# Patient Record
Sex: Female | Born: 1965 | Race: White | Hispanic: No | Marital: Married | Smoking: Never smoker
Health system: Southern US, Community
[De-identification: ages and names within clinical notes are randomized; demographics above are authoritative.]

## PROBLEM LIST (undated history)

## (undated) DIAGNOSIS — R51 Headache: Secondary | ICD-10-CM

## (undated) DIAGNOSIS — K2950 Unspecified chronic gastritis without bleeding: Secondary | ICD-10-CM

## (undated) DIAGNOSIS — T8859XA Other complications of anesthesia, initial encounter: Secondary | ICD-10-CM

## (undated) DIAGNOSIS — F1010 Alcohol abuse, uncomplicated: Secondary | ICD-10-CM

## (undated) DIAGNOSIS — D649 Anemia, unspecified: Secondary | ICD-10-CM

## (undated) DIAGNOSIS — R188 Other ascites: Secondary | ICD-10-CM

## (undated) DIAGNOSIS — D509 Iron deficiency anemia, unspecified: Secondary | ICD-10-CM

## (undated) DIAGNOSIS — K7460 Unspecified cirrhosis of liver: Secondary | ICD-10-CM

## (undated) DIAGNOSIS — T4145XA Adverse effect of unspecified anesthetic, initial encounter: Secondary | ICD-10-CM

## (undated) DIAGNOSIS — R161 Splenomegaly, not elsewhere classified: Secondary | ICD-10-CM

## (undated) DIAGNOSIS — K922 Gastrointestinal hemorrhage, unspecified: Secondary | ICD-10-CM

## (undated) DIAGNOSIS — I8500 Esophageal varices without bleeding: Secondary | ICD-10-CM

## (undated) DIAGNOSIS — K760 Fatty (change of) liver, not elsewhere classified: Secondary | ICD-10-CM

## (undated) DIAGNOSIS — F419 Anxiety disorder, unspecified: Secondary | ICD-10-CM

## (undated) HISTORY — DX: Unspecified chronic gastritis without bleeding: K29.50

## (undated) HISTORY — DX: Iron deficiency anemia, unspecified: D50.9

## (undated) HISTORY — DX: Unspecified cirrhosis of liver: K74.60

## (undated) HISTORY — DX: Splenomegaly, not elsewhere classified: R16.1

## (undated) HISTORY — DX: Esophageal varices without bleeding: I85.00

## (undated) HISTORY — PX: ORIF TIBIA & FIBULA FRACTURES: SHX2131

---

## 2015-06-24 DIAGNOSIS — F1010 Alcohol abuse, uncomplicated: Secondary | ICD-10-CM

## 2015-06-24 DIAGNOSIS — F419 Anxiety disorder, unspecified: Secondary | ICD-10-CM

## 2015-06-24 DIAGNOSIS — D649 Anemia, unspecified: Secondary | ICD-10-CM

## 2015-06-24 DIAGNOSIS — G8929 Other chronic pain: Secondary | ICD-10-CM

## 2015-06-24 DIAGNOSIS — K760 Fatty (change of) liver, not elsewhere classified: Secondary | ICD-10-CM

## 2015-06-24 DIAGNOSIS — R519 Headache, unspecified: Secondary | ICD-10-CM

## 2015-06-24 HISTORY — DX: Fatty (change of) liver, not elsewhere classified: K76.0

## 2015-06-24 HISTORY — DX: Other chronic pain: G89.29

## 2015-06-24 HISTORY — DX: Headache, unspecified: R51.9

## 2015-06-24 HISTORY — DX: Anemia, unspecified: D64.9

## 2015-06-24 HISTORY — DX: Alcohol abuse, uncomplicated: F10.10

## 2015-06-24 HISTORY — DX: Anxiety disorder, unspecified: F41.9

## 2015-11-04 ENCOUNTER — Inpatient Hospital Stay (HOSPITAL_COMMUNITY)
Admission: AD | Admit: 2015-11-04 | Discharge: 2015-11-07 | DRG: 369 | Disposition: A | Discharge status: Home / Self Care | Payer: Self-pay | Source: Other Acute Inpatient Hospital | Attending: Internal Medicine | Admitting: Internal Medicine

## 2015-11-04 ENCOUNTER — Encounter (HOSPITAL_COMMUNITY): Payer: Self-pay | Admitting: Family Medicine

## 2015-11-04 ENCOUNTER — Encounter (HOSPITAL_COMMUNITY)
Admission: AD | Disposition: A | Discharge status: Home / Self Care | Payer: Self-pay | Source: Other Acute Inpatient Hospital | Attending: Internal Medicine

## 2015-11-04 DIAGNOSIS — Z8711 Personal history of peptic ulcer disease: Secondary | ICD-10-CM

## 2015-11-04 DIAGNOSIS — F1010 Alcohol abuse, uncomplicated: Secondary | ICD-10-CM | POA: Diagnosis present

## 2015-11-04 DIAGNOSIS — K7030 Alcoholic cirrhosis of liver without ascites: Secondary | ICD-10-CM

## 2015-11-04 DIAGNOSIS — I8500 Esophageal varices without bleeding: Secondary | ICD-10-CM | POA: Diagnosis present

## 2015-11-04 DIAGNOSIS — D689 Coagulation defect, unspecified: Secondary | ICD-10-CM | POA: Diagnosis present

## 2015-11-04 DIAGNOSIS — F10239 Alcohol dependence with withdrawal, unspecified: Secondary | ICD-10-CM | POA: Diagnosis present

## 2015-11-04 DIAGNOSIS — Z79899 Other long term (current) drug therapy: Secondary | ICD-10-CM

## 2015-11-04 DIAGNOSIS — IMO0002 Reserved for concepts with insufficient information to code with codable children: Secondary | ICD-10-CM | POA: Diagnosis present

## 2015-11-04 DIAGNOSIS — D500 Iron deficiency anemia secondary to blood loss (chronic): Secondary | ICD-10-CM | POA: Diagnosis present

## 2015-11-04 DIAGNOSIS — I8501 Esophageal varices with bleeding: Principal | ICD-10-CM | POA: Diagnosis present

## 2015-11-04 DIAGNOSIS — K7031 Alcoholic cirrhosis of liver with ascites: Secondary | ICD-10-CM | POA: Diagnosis present

## 2015-11-04 DIAGNOSIS — K766 Portal hypertension: Secondary | ICD-10-CM | POA: Diagnosis present

## 2015-11-04 DIAGNOSIS — K922 Gastrointestinal hemorrhage, unspecified: Secondary | ICD-10-CM | POA: Diagnosis present

## 2015-11-04 DIAGNOSIS — K254 Chronic or unspecified gastric ulcer with hemorrhage: Secondary | ICD-10-CM | POA: Diagnosis present

## 2015-11-04 DIAGNOSIS — R188 Other ascites: Secondary | ICD-10-CM

## 2015-11-04 DIAGNOSIS — F419 Anxiety disorder, unspecified: Secondary | ICD-10-CM | POA: Diagnosis present

## 2015-11-04 DIAGNOSIS — D696 Thrombocytopenia, unspecified: Secondary | ICD-10-CM | POA: Diagnosis present

## 2015-11-04 DIAGNOSIS — F1090 Alcohol use, unspecified, uncomplicated: Secondary | ICD-10-CM | POA: Diagnosis present

## 2015-11-04 HISTORY — DX: Headache: R51

## 2015-11-04 HISTORY — PX: ESOPHAGOGASTRODUODENOSCOPY: SHX5428

## 2015-11-04 HISTORY — DX: Anemia, unspecified: D64.9

## 2015-11-04 HISTORY — DX: Gastrointestinal hemorrhage, unspecified: K92.2

## 2015-11-04 HISTORY — DX: Fatty (change of) liver, not elsewhere classified: K76.0

## 2015-11-04 HISTORY — DX: Alcohol abuse, uncomplicated: F10.10

## 2015-11-04 HISTORY — DX: Anxiety disorder, unspecified: F41.9

## 2015-11-04 LAB — CBC
HCT: 33.7 % — ABNORMAL LOW (ref 36.0–46.0)
HCT: 30.7 % — ABNORMAL LOW (ref 36.0–46.0)
HCT: 30.3 % — ABNORMAL LOW (ref 36.0–46.0)
Hemoglobin: 10.5 g/dL — ABNORMAL LOW (ref 12.0–15.0)
Hemoglobin: 9.7 g/dL — ABNORMAL LOW (ref 12.0–15.0)
Hemoglobin: 9.9 g/dL — ABNORMAL LOW (ref 12.0–15.0)
MCH: 31.2 pg (ref 26.0–34.0)
MCH: 31.4 pg (ref 26.0–34.0)
MCH: 32 pg (ref 26.0–34.0)
MCHC: 31.2 g/dL (ref 30.0–36.0)
MCHC: 31.6 g/dL (ref 30.0–36.0)
MCHC: 32.7 g/dL (ref 30.0–36.0)
MCV: 100 fL (ref 78.0–100.0)
MCV: 99.4 fL (ref 78.0–100.0)
MCV: 98.1 fL (ref 78.0–100.0)
Platelets: 164 10*3/uL (ref 150–400)
Platelets: 135 10*3/uL — ABNORMAL LOW (ref 150–400)
Platelets: 122 10*3/uL — ABNORMAL LOW (ref 150–400)
RBC: 3.37 MIL/uL — ABNORMAL LOW (ref 3.87–5.11)
RBC: 3.09 MIL/uL — ABNORMAL LOW (ref 3.87–5.11)
RBC: 3.09 MIL/uL — ABNORMAL LOW (ref 3.87–5.11)
RDW: 20.1 % — ABNORMAL HIGH (ref 11.5–15.5)
RDW: 20.2 % — ABNORMAL HIGH (ref 11.5–15.5)
RDW: 20.3 % — ABNORMAL HIGH (ref 11.5–15.5)
WBC: 8.5 10*3/uL (ref 4.0–10.5)
WBC: 7.5 10*3/uL (ref 4.0–10.5)
WBC: 6 10*3/uL (ref 4.0–10.5)

## 2015-11-04 LAB — TROPONIN I: Troponin I: <0.03 ng/mL (ref <0.031)

## 2015-11-04 LAB — COMPREHENSIVE METABOLIC PANEL
ALT: 26 U/L (ref 14–54)
ALT: 22 U/L (ref 14–54)
AST: 73 U/L — ABNORMAL HIGH (ref 15–41)
AST: 63 U/L — ABNORMAL HIGH (ref 15–41)
Albumin: 2.9 g/dL — ABNORMAL LOW (ref 3.5–5.0)
Albumin: 2.8 g/dL — ABNORMAL LOW (ref 3.5–5.0)
Alkaline Phosphatase: 87 U/L (ref 38–126)
Alkaline Phosphatase: 77 U/L (ref 38–126)
Anion gap: 15 (ref 5–15)
Anion gap: 9 (ref 5–15)
BUN: 12 mg/dL (ref 6–20)
BUN: 10 mg/dL (ref 6–20)
CO2: 22 mmol/L (ref 22–32)
CO2: 23 mmol/L (ref 22–32)
Calcium: 8.2 mg/dL — ABNORMAL LOW (ref 8.9–10.3)
Calcium: 8.1 mg/dL — ABNORMAL LOW (ref 8.9–10.3)
Chloride: 107 mmol/L (ref 101–111)
Chloride: 105 mmol/L (ref 101–111)
Creatinine, Ser: 0.49 mg/dL (ref 0.44–1.00)
Creatinine, Ser: 0.65 mg/dL (ref 0.44–1.00)
GFR calc Af Amer: >60 mL/min (ref >60)
GFR calc Af Amer: >60 mL/min (ref >60)
GFR calc non Af Amer: >60 mL/min (ref >60)
GFR calc non Af Amer: >60 mL/min (ref >60)
Glucose, Bld: 119 mg/dL — ABNORMAL HIGH (ref 65–99)
Glucose, Bld: 100 mg/dL — ABNORMAL HIGH (ref 65–99)
Potassium: 4.2 mmol/L (ref 3.5–5.1)
Potassium: 3.5 mmol/L (ref 3.5–5.1)
Sodium: 144 mmol/L (ref 135–145)
Sodium: 137 mmol/L (ref 135–145)
Total Bilirubin: 1.7 mg/dL — ABNORMAL HIGH (ref 0.3–1.2)
Total Bilirubin: 2.1 mg/dL — ABNORMAL HIGH (ref 0.3–1.2)
Total Protein: 6.3 g/dL — ABNORMAL LOW (ref 6.5–8.1)
Total Protein: 5.8 g/dL — ABNORMAL LOW (ref 6.5–8.1)

## 2015-11-04 LAB — HEMOGLOBIN AND HEMATOCRIT, BLOOD
HCT: 33.6 % — ABNORMAL LOW (ref 36.0–46.0)
Hemoglobin: 10.6 g/dL — ABNORMAL LOW (ref 12.0–15.0)

## 2015-11-04 LAB — ETHANOL: Alcohol, Ethyl (B): <5 mg/dL (ref <5)

## 2015-11-04 LAB — GLUCOSE, CAPILLARY
Glucose-Capillary: 103 mg/dL — ABNORMAL HIGH (ref 65–99)
Glucose-Capillary: 97 mg/dL (ref 65–99)

## 2015-11-04 LAB — MRSA PCR SCREENING: MRSA by PCR: NEGATIVE

## 2015-11-04 LAB — RAPID HIV SCREEN (HIV 1/2 AB+AG)
HIV 1/2 Antibodies: NONREACTIVE
HIV-1 P24 Antigen - HIV24: NONREACTIVE

## 2015-11-04 LAB — MAGNESIUM
Magnesium: 1.4 mg/dL — ABNORMAL LOW (ref 1.7–2.4)
Magnesium: 1.5 mg/dL — ABNORMAL LOW (ref 1.7–2.4)

## 2015-11-04 LAB — ABO/RH: ABO/RH(D): O POS

## 2015-11-04 LAB — PHOSPHORUS: Phosphorus: 2.1 mg/dL — ABNORMAL LOW (ref 2.5–4.6)

## 2015-11-04 LAB — APTT
aPTT: 36 seconds (ref 24–37)
aPTT: 33 seconds (ref 24–37)

## 2015-11-04 LAB — TYPE AND SCREEN
ABO/RH(D): O POS
Antibody Screen: NEGATIVE

## 2015-11-04 LAB — PROTIME-INR
INR: 1.76 — ABNORMAL HIGH (ref 0.00–1.49)
INR: 1.59 — ABNORMAL HIGH (ref 0.00–1.49)
Prothrombin Time: 20.5 seconds — ABNORMAL HIGH (ref 11.6–15.2)
Prothrombin Time: 19 seconds — ABNORMAL HIGH (ref 11.6–15.2)

## 2015-11-04 LAB — FIBRINOGEN: Fibrinogen: 264 mg/dL (ref 204–475)

## 2015-11-04 SURGERY — EGD (ESOPHAGOGASTRODUODENOSCOPY)
Anesthesia: Moderate Sedation

## 2015-11-04 MED ORDER — PANTOPRAZOLE SODIUM 40 MG IV SOLR: 40.0000 mg | Freq: Two times a day (BID) | INTRAVENOUS | Status: DC

## 2015-11-04 MED ORDER — FOLIC ACID 5 MG/ML IJ SOLN
1.0000 mg | Freq: Every day | INTRAMUSCULAR | Status: DC
  Administered 2015-11-05 – 2015-11-06 (×2): 1 mg via INTRAVENOUS
  Filled 2015-11-04 (×3): qty 0.2

## 2015-11-04 MED ORDER — MIDAZOLAM HCL 5 MG/ML IJ SOLN
INTRAMUSCULAR | Status: AC
  Filled 2015-11-04: qty 2

## 2015-11-04 MED ORDER — ONDANSETRON HCL 4 MG PO TABS: 4.0000 mg | ORAL_TABLET | Freq: Four times a day (QID) | ORAL | Status: DC | PRN

## 2015-11-04 MED ORDER — PANTOPRAZOLE SODIUM 40 MG IV SOLR
40.0000 mg | Freq: Two times a day (BID) | INTRAVENOUS | Status: DC
Start: 2015-11-04 — End: 2015-11-04
  Administered 2015-11-04: 40 mg via INTRAVENOUS
  Filled 2015-11-04: qty 40

## 2015-11-04 MED ORDER — FENTANYL CITRATE (PF) 100 MCG/2ML IJ SOLN
INTRAMUSCULAR | Status: DC | PRN
  Administered 2015-11-04 (×7): 25 ug via INTRAVENOUS

## 2015-11-04 MED ORDER — ONDANSETRON HCL 4 MG/2ML IJ SOLN
INTRAMUSCULAR | Status: AC
  Filled 2015-11-04: qty 2

## 2015-11-04 MED ORDER — ACETAMINOPHEN 650 MG RE SUPP: 650.0000 mg | Freq: Four times a day (QID) | RECTAL | Status: DC | PRN

## 2015-11-04 MED ORDER — LORAZEPAM 1 MG PO TABS: 1.0000 mg | ORAL_TABLET | Freq: Four times a day (QID) | ORAL | Status: DC | PRN

## 2015-11-04 MED ORDER — SODIUM CHLORIDE 0.9 % IV SOLN: INTRAVENOUS | Status: DC

## 2015-11-04 MED ORDER — FENTANYL CITRATE (PF) 100 MCG/2ML IJ SOLN
INTRAMUSCULAR | Status: AC
  Filled 2015-11-04: qty 2

## 2015-11-04 MED ORDER — ADULT MULTIVITAMIN W/MINERALS CH
1.0000 | ORAL_TABLET | Freq: Every day | ORAL | Status: DC
  Administered 2015-11-04: 1 via ORAL
  Filled 2015-11-04: qty 1

## 2015-11-04 MED ORDER — SODIUM CHLORIDE 0.9 % IV SOLN
8.0000 mg/h | INTRAVENOUS | Status: DC
  Administered 2015-11-04 – 2015-11-06 (×4): 8 mg/h via INTRAVENOUS
  Filled 2015-11-04 (×9): qty 80

## 2015-11-04 MED ORDER — CEFTRIAXONE SODIUM 1 G IJ SOLR
1.0000 g | INTRAMUSCULAR | Status: DC
  Administered 2015-11-04 – 2015-11-06 (×3): 1 g via INTRAVENOUS
  Filled 2015-11-04 (×4): qty 10

## 2015-11-04 MED ORDER — SODIUM CHLORIDE 0.9 % IV SOLN
INTRAVENOUS | Status: DC
  Administered 2015-11-04 – 2015-11-07 (×3): via INTRAVENOUS

## 2015-11-04 MED ORDER — SODIUM CHLORIDE 0.9 % IV BOLUS (SEPSIS)
1000.0000 mL | Freq: Once | INTRAVENOUS | Status: AC
  Administered 2015-11-04: 1000 mL via INTRAVENOUS

## 2015-11-04 MED ORDER — SODIUM CHLORIDE 0.9% FLUSH
3.0000 mL | Freq: Two times a day (BID) | INTRAVENOUS | Status: DC
  Administered 2015-11-05 – 2015-11-06 (×2): 3 mL via INTRAVENOUS

## 2015-11-04 MED ORDER — ONDANSETRON HCL 4 MG/2ML IJ SOLN
4.0000 mg | Freq: Four times a day (QID) | INTRAMUSCULAR | Status: DC | PRN
  Administered 2015-11-04 (×2): 4 mg via INTRAVENOUS
  Filled 2015-11-04: qty 2

## 2015-11-04 MED ORDER — VITAMIN K1 10 MG/ML IJ SOLN
5.0000 mg | Freq: Once | INTRAVENOUS | Status: AC
  Administered 2015-11-04: 5 mg via INTRAVENOUS
  Filled 2015-11-04: qty 0.5

## 2015-11-04 MED ORDER — MIDAZOLAM HCL 5 MG/ML IJ SOLN
INTRAMUSCULAR | Status: AC
  Filled 2015-11-04: qty 1

## 2015-11-04 MED ORDER — MIDAZOLAM HCL 10 MG/2ML IJ SOLN
INTRAMUSCULAR | Status: DC | PRN
  Administered 2015-11-04 (×6): 1 mg via INTRAVENOUS
  Administered 2015-11-04 (×2): 2 mg via INTRAVENOUS
  Administered 2015-11-04 (×2): 1 mg via INTRAVENOUS

## 2015-11-04 MED ORDER — THIAMINE HCL 100 MG/ML IJ SOLN
100.0000 mg | Freq: Every day | INTRAMUSCULAR | Status: DC
  Administered 2015-11-05 – 2015-11-06 (×2): 100 mg via INTRAVENOUS
  Filled 2015-11-04: qty 1
  Filled 2015-11-04: qty 2

## 2015-11-04 MED ORDER — FOLIC ACID 1 MG PO TABS
1.0000 mg | ORAL_TABLET | Freq: Every day | ORAL | Status: DC
  Administered 2015-11-04: 1 mg via ORAL
  Filled 2015-11-04: qty 1

## 2015-11-04 MED ORDER — DIPHENHYDRAMINE HCL 50 MG/ML IJ SOLN
INTRAMUSCULAR | Status: AC
  Filled 2015-11-04: qty 1

## 2015-11-04 MED ORDER — ACETAMINOPHEN 325 MG PO TABS
650.0000 mg | ORAL_TABLET | Freq: Four times a day (QID) | ORAL | Status: DC | PRN
  Administered 2015-11-04: 650 mg via ORAL
  Filled 2015-11-04: qty 2

## 2015-11-04 MED ORDER — SODIUM CHLORIDE 0.9 % IV SOLN
8.0000 mg/h | INTRAVENOUS | Status: DC
  Filled 2015-11-04 (×2): qty 80

## 2015-11-04 MED ORDER — SODIUM CHLORIDE 0.9 % IV SOLN
80.0000 mg | Freq: Once | INTRAVENOUS | Status: AC
  Administered 2015-11-04: 80 mg via INTRAVENOUS
  Filled 2015-11-04: qty 80

## 2015-11-04 MED ORDER — SODIUM CHLORIDE 0.9 % IV SOLN
50.0000 ug/h | INTRAVENOUS | Status: DC
  Administered 2015-11-04 – 2015-11-06 (×6): 50 ug/h via INTRAVENOUS
  Filled 2015-11-04 (×14): qty 1

## 2015-11-04 MED ORDER — DEXTROSE 5 % IV SOLN
3.0000 g | Freq: Once | INTRAVENOUS | Status: AC
  Administered 2015-11-04: 3 g via INTRAVENOUS
  Filled 2015-11-04: qty 6

## 2015-11-04 MED ORDER — LORAZEPAM 2 MG/ML IJ SOLN
2.0000 mg | INTRAMUSCULAR | Status: DC | PRN
Start: 2015-11-04 — End: 2015-11-07

## 2015-11-04 MED ORDER — SODIUM CHLORIDE 0.9 % IV SOLN
80.0000 mg | Freq: Two times a day (BID) | INTRAVENOUS | Status: DC
  Filled 2015-11-04: qty 80

## 2015-11-04 MED ORDER — OCTREOTIDE LOAD VIA INFUSION
50.0000 ug | Freq: Once | INTRAVENOUS | Status: AC
  Administered 2015-11-04: 50 ug via INTRAVENOUS
  Filled 2015-11-04: qty 25

## 2015-11-04 MED ORDER — SODIUM CHLORIDE 0.9 % IV SOLN: 250.0000 mL | INTRAVENOUS | Status: DC | PRN

## 2015-11-04 MED ORDER — MORPHINE SULFATE (PF) 2 MG/ML IV SOLN: 2.0000 mg | INTRAVENOUS | Status: DC | PRN

## 2015-11-04 MED ORDER — DIPHENHYDRAMINE HCL 50 MG/ML IJ SOLN
INTRAMUSCULAR | Status: DC | PRN
  Administered 2015-11-04 (×2): 25 mg via INTRAVENOUS

## 2015-11-04 MED ORDER — VITAMIN B-1 100 MG PO TABS
100.0000 mg | ORAL_TABLET | Freq: Every day | ORAL | Status: DC
  Administered 2015-11-04: 100 mg via ORAL
  Filled 2015-11-04 (×3): qty 1

## 2015-11-04 MED ORDER — BUTAMBEN-TETRACAINE-BENZOCAINE 2-2-14 % EX AERO
INHALATION_SPRAY | CUTANEOUS | Status: DC | PRN
  Administered 2015-11-04: 2 via TOPICAL

## 2015-11-04 MED ORDER — LORAZEPAM 2 MG/ML IJ SOLN: 1.0000 mg | Freq: Four times a day (QID) | INTRAMUSCULAR | Status: DC | PRN

## 2015-11-04 NOTE — H&P (Signed)
History and Physical  Patient Name: Tina Haley     ZOX:[id number]    DOB: 16-Sep-1965    DOA: 11/04/2015 Referring physician: Jacalyn Lefevre, [facility] PCP: [name], MD      Chief Complaint: Coffee ground emesis  HPI: [name] is a 50 y.o. female with a past medical history significant for GIB in December who presents with coffee ground emesis.  Admitted to the hospital last December for NSAID-induced ulcer and hematemesis. She had a hemoglobin nadir of 7.9, and received transfusion of 1 unit of packed red blood cells. She had an upper endoscopy that showed no varices and no intervention was required. She was treated conservatively with pantoprazole, and discharged on PPI once daily.  Since then, she has had no problems until yesterday in the afternoon she developed intense sharp right upper quadrant pain that came and went. It is not associated with food, movement, or exertion. Sometime after that she got nauseous and vomited what appeared to be coffee-ground emesis. She went to the ER, but it was so carotid that she said over the home and so she left. However when she had her husband returned home, she had several more episodes of "a lot" of black tarry vomit, and then a black sticky bowel movement, and so they went to the ER again.  In the ED, she was tachycardic to 128, but blood pressure was normal. Renal function normal. WBC 11 K, Hgb 12.9, platelets normal. She is not on x-ray or anticoagulants. She denies recent NSAID use. Inside-out, I was told that the patient drinks alcohol heavily, but the patient denies this.  She was given pantoprazole 40 mg IV and IV fluids, and TRH were asked to admit in transfer because of lack of GI coverage at [facility] this week.     Review of Systems:  Pt complains of coffee-ground emesis, melena, right upper quadrant pain. All other systems negative except as just noted or noted in the history of present illness.  No  Known Allergies  Prior to Admission medications   None    Past Medical History  Diagnosis Date  . GI bleed     Past Surgical History  Procedure Laterality Date  . Orif tibia & fibula fractures      Family history: family history includes Coronary artery disease in her mother; Diabetes in her maternal aunt and mother; Heart disease in her maternal grandfather; Peptic Ulcer in her father; Peripheral Artery Disease in her father.  Social History: Patient lives with her husband. She does not work.  She does not smoke.  She denies to me using alcohol to excess and only drinking alcohol occasionally.     Physical Exam: BP 138/74 mmHg  Pulse 116  Temp(Src) 98.4 F (36.9 C) (Oral)  Resp 14  Ht  (1.6 m)  Wt 70.8 kg (156 lb 1.4 oz)  BMI 27.66 kg/m2  SpO2 97% General appearance: Well-developed, adult female, alert and in no acute distress.  Anxious.   Eyes: Anicteric, conjunctiva pink, lids and lashes normal.  Colored contact lenses. ENT: No nasal deformity, discharge, or epistaxis.  OP moist without lesions.   Lymph: No cervical or supraclavicular lymphadenopathy. Skin: Warm and dry.  No jaundice.  No suspicious rashes or lesions. Cardiac: Tachycardic, regular, nl S1-S2, no murmurs appreciated.  Capillary refill is brisk.  JVP normal.  No LE edema.  Radial and DP pulses 2+ and symmetric. Respiratory: Normal respiratory rate and rhythm.  CTAB without rales or wheezes. Abdomen:  Abdomen soft without rigidity.  No TTP. No ascites, distension.   MSK: No deformities or effusions. Neuro: Sensorium intact and responding to questions, attention normal.  Speech is fluent.  Moves all extremities equally and with normal coordination.    Psych: Behavior appropriate.  Affect anxious.  No evidence of aural or visual hallucinations or delusions.       Labs on Admission from OSH:  The metabolic panel shows normal sodium, potassium, and renal function. The complete blood count shows mild  leukocytosis, no anemia at presentation.     Assessment/Plan 1. Presumed upper GI bleed:  This is new.  No obvious etiology, no NSAID use, patient denies alcohol.   -Maintain 2 PIV -Type and screen -M IVF and NPO -Consult to gastroenterology, appreciate Recommendations -Pantoprazole 40 mg IV BID   2. Possible alcohol withdrawal:  Patient denies regular alcohol use or history of withdrawal, but appears somewhat tremulous, tachycardic.  This may all be from blood loss, but will add CIWA for now [facility] heavier [facility] use than patient is disclosing to me.      DVT PPx: SCDs Diet: NPO Consultants: GI Code Status: FULL Family Communication: Husband at bedside  Medical decision making: What exists of the patient's previous chart from [facility] was reviewed in depth and the case was discussed with Dr. Particia NearingHaviland and GI. Patient seen 7:11 AM on 11/04/2015.  Disposition Plan:  I recommend admission to stepdown, inpatient status.  Clinical condition: stable at present, but potential for deterioration given report of large volume bleeding.  Anticipate evaluation by GI today, perhaps endoscopy today or tomorrow, then discharge pending findings.      Alberteen SamChristopher P Danford [facility] Pager [phone]

## 2015-11-04 NOTE — Progress Notes (Signed)
eLink Physician-Brief Progress Note Patient Name: Tina Haley DOB: 03-10-1966 MRN: [id number]   Date of Service  11/04/2015  HPI/Events of Note  Follow up on labs on a GI bleed pt  eICU Interventions  INR 1.59 from 1.76, fibrinogen 264, Hb/hct stable.      Intervention Category Minor Interventions: Other:  Louann SjogrenJose Angelo A De Dios 11/04/2015, 9:49 PM

## 2015-11-04 NOTE — Op Note (Signed)
[facility] Patient Name: Tina Haley Procedure Date : 11/04/2015 MRN: [id number] Attending MD: Willaim Rayas. Armbruster MD, MD Date of Birth: Jul 13, 1966 CSN: [id number] Age: 50 Admit Type: Inpatient Procedure:                Upper GI endoscopy Indications:              Hematemesis, Melena, history of alcohol use Providers:                Viviann Spare P. Armbruster MD, MD, Michel Bickers, RN,                            Harrington Challenger, Technician Referring MD:              Medicines:                Fentanyl 175 micrograms IV, Midazolam 12 mg IV,                            Diphenhydramine 50 mg IV Complications:            bleeding was noted during the procedure, but                            hemostasis was achieved Estimated Blood Loss:     blood loss occured during the procedure but                            hemostasis was able to be achieved, unable to                            calculate how much blood lost Procedure:                Pre-Anesthesia Assessment:                           - Prior to the procedure, a History and Physical                            was performed, and patient medications and                            allergies were reviewed. The patient's tolerance of                            previous anesthesia was also reviewed. The risks                            and benefits of the procedure and the sedation                            options and risks were discussed with the patient.                            All questions were answered, and informed consent  was obtained. Prior Anticoagulants: The patient has                            taken no previous anticoagulant or antiplatelet                            agents. ASA Grade Assessment: III - A patient with                            severe systemic disease. After reviewing the risks                            and benefits, the patient was deemed in   satisfactory condition to undergo the procedure.                           After obtaining informed consent, the endoscope was                            passed under direct vision. Throughout the                            procedure, the patient's blood pressure, pulse, and                            oxygen saturations were monitored continuously. The                            EG-[id number] ([id number]) scope was introduced through the                            mouth, and advanced to the second part of duodenum.                            The upper GI endoscopy was accomplished without                            difficulty. The patient tolerated the procedure. Scope In: Scope Out: Findings:      Esophagogastric landmarks were identified: the Z-line was found at 36       cm, the gastroesophageal junction was found at 36 cm and the upper       extent of the gastric folds was found at 36 cm from the incisors.      One large varix was found in the lower third of the esophagus. On       initial exam no bleeding was noted. However significant blood was noted       in the stomach. After lavage the area was reinspected and spontaneous       squirting of blood occur from the varix at the GEJ and blood quickly       filled the field. A band was placed on the actively bleeding site and       hemostasis was achieved. Another band was placed more proximally in the       lower esophagus along the same varix, which then  flattened. There was no       bleeding at the end of the maneuver.      Roughly five non-bleeding superficial gastric ulcers with no stigmata of       bleeding were found in the gastric antrum and at the pylorus. All were       clean based. The largest lesion was 6-7 mm in largest dimension.      Red blood was found in the gastric fundus and in the gastric body, and       took time to be lavaged, and could be seen coming down from the GEJ at       the site of the variceal bleed.      The  exam of the stomach was otherwise normal.      The duodenal bulb and second portion of the duodenum were normal. Impression:               - Esophagogastric landmarks identified.                           - One large esophageal varix, actively bleeding.                            Banded x 2 with hemostasis                           - Non-bleeding gastric ulcers with no stigmata of                            bleeding.                           - Red blood in the gastric fundus and in the                            gastric body.                           - Normal duodenal bulb and second portion of the                            duodenum.                           - Moderate Sedation:      Moderate (conscious) sedation was administered by the endoscopy nurse       and supervised by the endoscopist. The following parameters were       monitored: oxygen saturation, heart rate, blood pressure, and response       to care. Total physician intraservice time was 30 minutes. Recommendation:           - Transfer the patient to ICU for ongoing care.                           - NPO                           - Start octreotide drip                           -  Start IV protonix                           - Stat CBC, transfuse for Hgb goal of 7. Repeat                            INR, FFP if patient has recurrence of bleeding and                            if INR elevated                           - Recommend Ceftriaxone for suspected underlying                            cirrhosis and GI Bleeding                           - Please page for any recurrence of bleeding, we                            will continue to follow closely                           - H pylori IgG serology, treat H pylori if positive                           - Continue present medications. Procedure Code(s):        --- Professional ---                           [phone], Esophagogastroduodenoscopy, flexible,                             transoral; with band ligation of esophageal/gastric                            varices                           99152, Moderate sedation services provided by the                            same physician or other qualified health care                            professional performing the diagnostic or                            therapeutic service that the sedation supports,                            requiring the presence of an independent trained                            observer to assist  in the monitoring of the                            patient's level of consciousness and physiological                            status; initial 15 minutes of intraservice time,                            patient age 48 years or older                           [phone], Moderate sedation services; each additional                            15 minutes intraservice time Diagnosis Code(s):        --- Professional ---                           I85.00, Esophageal varices without bleeding                           K25.9, Gastric ulcer, unspecified as acute or                            chronic, without hemorrhage or perforation                           K92.2, Gastrointestinal hemorrhage, unspecified                           K92.0, Hematemesis                           K92.1, Melena (includes Hematochezia) CPT copyright 2016 American Medical Association. All rights reserved. The codes documented in this report are preliminary and upon coder review may  be revised to meet current compliance requirements. Viviann Spare P. Armbruster MD, MD 11/04/2015 4:39:48 PM This report has been signed electronically. Number of Addenda: 0

## 2015-11-04 NOTE — Consult Note (Signed)
PULMONARY / CRITICAL CARE MEDICINE   Name: Tina Haley MRN: [id number] DOB: [birth year]    ADMISSION DATE:  11/04/2015 CONSULTATION DATE:  11/04/2015  REFERRING MD:  Joseph ArtWoods  CHIEF COMPLAINT:  GIB  HISTORY OF PRESENT ILLNESS:   50 year old female with PMH as below, which includes ETOH abuse, fatty liver, and anxiety. She was admitted for GIB (NSAID induced) in 06/2015, during which she underwent EGD and no varices were noted. No intervention as required and conservative management was recommended. She was discharged on daily PPI from former GIB.  Medical course has been uncomplicated since that time until 4/12 when she developed intermittent RUQ pain. She also had several episodes of coffee-ground emesis and dark tarry stool. She presented to the ED with these complaints where she was found to be tachycardic to 128, but normotensive. At that time the patient denied NSAIDS, ETOH, and anticoagulants, however, family members reported that she drinks rather heavily. She was seen by GI and started on PPI. Taken for endoscopy where she was found to have on large esophageal varix, and underwent banding x2. There were 2 gastric ulcers found, but no stigmata of bleeding. Due to large blood loss patient was send to ICU post-procedurally.  PAST MEDICAL HISTORY :  She  has a past medical history of GI bleed (06/2015, 10/2015); Anxiety (06/2015); Chronic headaches (06/2015); Fatty liver (06/2015); Anemia (06/2015); and Alcohol abuse (06/2015).  PAST SURGICAL HISTORY: She  has past surgical history that includes ORIF tibia & fibula fractures.  No Known Allergies  No current facility-administered medications on file prior to encounter.   No current outpatient prescriptions on file prior to encounter.    FAMILY HISTORY:  Her has no family status information on file.   SOCIAL HISTORY: She  reports that she has never smoked. She does not have any smokeless tobacco history on file. She reports that she  drinks alcohol. She reports that she does not use illicit drugs.  REVIEW OF SYSTEMS:   Bolds are positive  Constitutional: weight loss, gain, night sweats, Fevers, chills, fatigue .  HEENT: headaches, Sore throat, sneezing, nasal congestion, post nasal drip, Difficulty swallowing, Tooth/dental problems, visual complaints visual changes, ear ache CV:  chest pain, radiates: ,Orthopnea, PND, swelling in lower extremities, dizziness, palpitations, syncope.  GI  heartburn, indigestion, abdominal pain, nausea, vomiting (frank and dark blood), diarrhea (dark tarry), change in bowel habits, loss of appetite, bloody stools.  Resp: cough, productive: , hemoptysis, dyspnea, chest pain, pleuritic.  Skin: rash or itching or icterus GU: dysuria, change in color of urine, urgency or frequency. flank pain, hematuria  MS: joint pain or swelling. decreased range of motion  Psych: change in mood or affect. depression or anxiety.  Neuro: difficulty with speech, weakness, numbness, ataxia    SUBJECTIVE:    VITAL SIGNS: BP 129/68 mmHg  Pulse 105  Temp(Src) 98.7 F (37.1 C) (Oral)  Resp 14  Ht 5\' 3"  (1.6 m)  Wt 70.8 kg (156 lb 1.4 oz)  BMI 27.66 kg/m2  SpO2 95%  HEMODYNAMICS:    VENTILATOR SETTINGS:    INTAKE / OUTPUT:    PHYSICAL EXAMINATION: General:  Overweight female in NAD Neuro:  Alert, oriented, non-focal HEENT:  [place]/AT, PERRL, no JVD noted Cardiovascular:  RRR, no MRG Lungs:  Clear bilateral breath sounds Abdomen:  Soft, non-tender, non-distended Musculoskeletal:  No acute deformity, ROM limitation,  or edema Skin:  Grossly intact  LABS:  BMET  Recent Labs Lab 11/04/15 0845  NA 144  K 4.2  CL 107  CO2 22  BUN 12  CREATININE 0.49  GLUCOSE 119*    Electrolytes  Recent Labs Lab 11/04/15 0845  CALCIUM 8.2*  MG 1.4*    CBC  Recent Labs Lab 11/04/15 0845 11/04/15 1625  WBC 8.5  --   HGB 10.5* 10.6*  HCT 33.7* 33.6*  PLT 164  --     Coag's  Recent  Labs Lab 11/04/15 0845  APTT 36  INR 1.76*    Sepsis Markers No results for input(s): LATICACIDVEN, PROCALCITON, O2SATVEN in the last 168 hours.  ABG No results for input(s): PHART, PCO2ART, PO2ART in the last 168 hours.  Liver Enzymes  Recent Labs Lab 11/04/15 0845  AST 73*  ALT 26  ALKPHOS 87  BILITOT 1.7*  ALBUMIN 2.9*    Cardiac Enzymes No results for input(s): TROPONINI, PROBNP in the last 168 hours.  Glucose  Recent Labs Lab 11/04/15 0916 11/04/15 1216  GLUCAP 103* 97    Imaging No results found.   STUDIES:  EGD 4/13 > one large bleeding varix banding x 2. Non-bleeding gastric ulcers  CULTURES: none  ANTIBIOTICS: Rocephin 4/13>>>  SIGNIFICANT EVENTS: 4/13 admit, EDG, variceal bleed  LINES/TUBES: PIV x2  DISCUSSION: 50 year old female with suspected ETOH abuse admitted for GIB. Found to have bleeding varix. Banded x 2 4/13. Due to significant blood loss was transferred to the ICU post procedure. Will monitor in ICU overnight.   ASSESSMENT / PLAN:  GI bleed secondary to bleeding varix s/p banding 4/13 Acute blood loss anemia - GI following - CBC q 4 hours overnight - Transfuse for Hgb < 8 - PPI BID - Octreotide infusion - Continue ceftriaxone per GI - NPO for tonight  ETOH abuse patient reports only 3-4 drinks per week, but reported  - monitor for signs of withdrawal - CIWA protocol - Thiamine - Folate   Diet: NPO Prophylaxis:  SCDs & Protonix IV q12hr   Joneen Roach, AGACNP-BC [facility] Pulmonology/Critical Care Pager [phone] or [phone]  11/04/2015 5:52 PM  PCCM Attending Note: Patient seen and examined with nurse practitioner. Please refer to his consultation note which I reviewed in detail.50 year old female with chronic alcohol use. Presents with upper GI bleedand found to have esophageal varix status post banding. Patient did have  Gastric ulcers but no stigmata of bleeding. Currently on octreotide infusion and  Protonix IV every 12 hours.  Continuing thiamine & folic acid. Repeating serum coags and administering vitamin K 5 mg IV 1. Patient may require further FFP or bile depending upon results of Coags. Replacing hypomagnesemia with 3 g magnesium sulfate & continuing CIWA protocol.  I have spent a total of 31 minutes of critical care time caring for the patient and reviewing the patient's electronic medical record.  Donna Christen Jamison Neighbor, M.D. [facility] Pulmonary & Critical Care Pager:  [phone] After 3pm or if no response, call [phone] 8:16 PM 11/04/2015

## 2015-11-04 NOTE — Interval H&P Note (Signed)
History and Physical Interval Note:  11/04/2015 3:19 PM  Minna AntisKathy Sow  has presented today for surgery, with the diagnosis of gi bleed  The various methods of treatment have been discussed with the patient and family. After consideration of risks, benefits and other options for treatment, the patient has consented to  Procedure(s): ESOPHAGOGASTRODUODENOSCOPY (EGD) (N/A) as a surgical intervention .  The patient's history has been reviewed, patient examined, no change in status, stable for surgery.  I have reviewed the patient's chart and labs.  Questions were answered to the patient's satisfaction.     Reeves ForthSteven Paul Armbruster

## 2015-11-04 NOTE — H&P (View-Only) (Signed)
                                                                            Gastroenterology Consult: 8:48 AM 11/04/2015  LOS: 0 days    Referring Provider: Dr Woods  Primary Care Physician:  Burkhart,Brian, MD Primary Gastroenterologist:  Unassigned. Dr Meisenheimer.     Reason for Consultation:  CG emesis   HPI: [name] is a 50 y.o. female.   06/2015 GI bleed with hematemesis from NSAID ulcer. Discharged on q day PPI.  PRBC x 1 for Hgb 7.9.  Chart notes mention possible heavy ETOH use and had fatty liver on ultrasound of 06/2015.   She says she drinks 'socially" at least 1 x per week and is vague as to amounts.  Also was taking PPI, protonix, 1 x per day but ran out about a week ago. No ASA, NSAIDs: using up to 6 or 8 extra strength Tylenol daily for headaches.   Pt had ROV with GI in 07/2015 and EGD set for 09/2015 but Dr M is in midst of getting surgery himself so the follow up EGD has been put of for now.   Pt generally has poor appetite.  Infrequent, spontaneous n/v of clears.  No melena or BPR.    Recurrent emesis of Cg material in afternoon 4/12. Subsequent black and tarry emesis, them melenic BM. + syncopal spell (she fell onto a pile of clothes, no trauma sustained.   Pulse 120s but BP 120s/70s.  Hgb 12.9.  BUN 16.  AST/ALT 115/32.  t bili 2.2.  Alk phos 125 (in normal range).  coags normal. ETOH elvated at 0.18  Past Medical History  Diagnosis Date  . GI bleed 06/2015    gastric ulcer per EGD dr Meisenheimer  . Anxiety 06/2015    Panic attacks  . Chronic headaches 06/2015  . Fatty liver 06/2015    Past Surgical History  Procedure Laterality Date  . Orif tibia & fibula fractures      Prior to Admission medications   Not on File    Scheduled Meds: . folic acid  1 mg Oral Daily  . multivitamin with minerals  1 tablet Oral Daily  . pantoprazole (PROTONIX) IV   40 mg Intravenous Q12H  . sodium chloride flush  3 mL Intravenous Q12H  . thiamine  100 mg Oral Daily   Or  . thiamine  100 mg Intravenous Daily   Infusions: . sodium chloride 125 mL/hr at 11/04/15 0635   PRN Meds: acetaminophen **OR** acetaminophen, LORazepam **OR** LORazepam, morphine injection, ondansetron **OR** ondansetron (ZOFRAN) IV   Allergies as of 11/04/2015  . (No Known Allergies)    Family History  Problem Relation Age of Onset  . Peptic Ulcer Father   . Diabetes Mother   . Diabetes Maternal Aunt   . Heart disease Maternal Grandfather   . Coronary artery disease Mother   . Peripheral Artery Disease Father     Social History   Social History  . Marital Status: Single    Spouse Name: N/A  . Number of Children: N/A  . Years of Education: N/A   Occupational History  . Not on file.   Social History   Main Topics  . Smoking status: Never Smoker   . Smokeless tobacco: Not on file  . Alcohol Use: 0.0 oz/week    0 Standard drinks or equivalent per week     Comment: Denies daily use, denies binge use  . Drug Use: No  . Sexual Activity: Not on file   Other Topics Concern  . Not on file   Social History Narrative  . No narrative on file    REVIEW OF SYSTEMS: Constitutional:  Weight stable.  No profound fatigue or weakness ENT:  No nose bleeds Pulm:  No SOB or cough CV:  No palpitations, no LE edema.  GU:  No hematuria, no frequency GI:  Per HPI.  Denies hx of liver disease Heme:  No excessive bleeding or bruising   Transfusions:  1 in 06/2015 Neuro:  No headaches, no peripheral tingling or numbness Derm:  No itching, no rash or sores.  Endocrine:  No sweats or chills.  No polyuria or dysuria Immunization:  Not queried Travel:  None beyond local counties in last few months.    PHYSICAL EXAM: Vital signs in last 24 hours: Filed Vitals:   11/04/15 0614 11/04/15 0800  BP: 138/74 113/63  Pulse: 116 107  Temp: 98.4 F (36.9 C) 98.6 F (37 C)    Resp: 14 13   Wt Readings from Last 3 Encounters:  11/04/15 70.8 kg (156 lb 1.4 oz)    General: somewhat pale, comfortable Head:  No trauma  Eyes:  No icterus or pallor Ears:  Not HOH  Nose:  No discharge Mouth:  Clear, moist Neck:  No mass or TMG Lungs:  Clear bil.   No dyspnea or cough Heart: RRR.  No MRG.  S1/S2 present Abdomen:  Soft, NT, ND.  No mass or HSM.  Active BS.   Rectal: deferred   Musc/Skeltl: no joint swelling, tenderness of deformity Extremities:  No CCE  Neurologic:  Oriented x 3.  Alert.  Moves all 4s.  No tremor Skin:  No telangectasia or sores.  No rash   Psych:  Anxious. Cooperative.   Intake/Output from previous day:   Intake/Output this shift:    LAB RESULTS: No results for input(s): WBC, HGB, HCT, PLT in the last 72 hours. BMET No results found for: NA, K, CL, CO2, GLUCOSE, BUN, CREATININE, CALCIUM LFT No results for input(s): PROT, ALBUMIN, AST, ALT, ALKPHOS, BILITOT, BILIDIR, IBILI in the last 72 hours. PT/INR No results found for: INR, PROTIME Hepatitis Panel No results for input(s): HEPBSAG, HCVAB, HEPAIGM, HEPBIGM in the last 72 hours. C-Diff No components found for: CDIFF Lipase  No results found for: LIPASE  Drugs of Abuse  No results found for: LABOPIA, COCAINSCRNUR, LABBENZ, AMPHETMU, THCU, LABBARB   RADIOLOGY STUDIES: No results found.  ENDOSCOPIC STUDIES: Per HPI  IMPRESSION:   *  UGIB in pt with hx NSAID ulcer and GIB in 06/2015.  Claims she ran out of Protonix about 1 week ago.    *  ETOH abuse.  Pt minimizes but elevated ETOH level and SGOT>> SGPT.  Fatty liver on 06/2015 ultrasond.     PLAN:     *  EGD, timing TBD.  PPI IV BID for now   Carola Viramontes  11/04/2015, 8:48 AM Pager: [phone]      

## 2015-11-04 NOTE — Consult Note (Signed)
                                                                           [facility] Consult: 8:48 AM 11/04/2015  LOS: 0 days    Referring Provider: Dr Woods  Primary Care Physician:  Burkhart,Brian, MD Primary Gastroenterologist:  Unassigned. Dr Meisenheimer.     Reason for Consultation:  CG emesis   HPI: Miyuki Schermerhorn is a 50 y.o. female.   06/2015 GI bleed with hematemesis from NSAID ulcer. Discharged on q day PPI.  PRBC x 1 for Hgb 7.9.  Chart notes mention possible heavy ETOH use and had fatty liver on ultrasound of 06/2015.   She says she drinks 'socially" at least 1 x per week and is vague as to amounts.  Also was taking PPI, protonix, 1 x per day but ran out about a week ago. No ASA, NSAIDs: using up to 6 or 8 extra strength Tylenol daily for headaches.   Pt had ROV with GI in 07/2015 and EGD set for 09/2015 but Dr M is in midst of getting surgery himself so the follow up EGD has been put of for now.   Pt generally has poor appetite.  Infrequent, spontaneous n/v of clears.  No melena or BPR.    Recurrent emesis of Cg material in afternoon 4/12. Subsequent black and tarry emesis, them melenic BM. + syncopal spell (she fell onto a pile of clothes, no trauma sustained.   Pulse 120s but BP 120s/70s.  Hgb 12.9.  BUN 16.  AST/ALT 115/32.  t bili 2.2.  Alk phos 125 (in normal range).  coags normal. ETOH elvated at 0.18  Past Medical History  Diagnosis Date  . GI bleed 06/2015    gastric ulcer per EGD dr Meisenheimer  . Anxiety 06/2015    Panic attacks  . Chronic headaches 06/2015  . Fatty liver 06/2015    Past Surgical History  Procedure Laterality Date  . Orif tibia & fibula fractures      Prior to Admission medications   Not on File    Scheduled Meds: . folic acid  1 mg Oral Daily  . multivitamin with minerals  1 tablet Oral Daily  . pantoprazole (PROTONIX) IV   40 mg Intravenous Q12H  . sodium chloride flush  3 mL Intravenous Q12H  . thiamine  100 mg Oral Daily   Or  . thiamine  100 mg Intravenous Daily   Infusions: . sodium chloride 125 mL/hr at 11/04/15 0635   PRN Meds: acetaminophen **OR** acetaminophen, LORazepam **OR** LORazepam, morphine injection, ondansetron **OR** ondansetron (ZOFRAN) IV   Allergies as of 11/04/2015  . (No Known Allergies)    Family History  Problem Relation Age of Onset  . Peptic Ulcer Father   . Diabetes Mother   . Diabetes Maternal Aunt   . Heart disease Maternal Grandfather   . Coronary artery disease Mother   . Peripheral Artery Disease Father     Social History   Social History  . Marital Status: Single    Spouse Name: N/A  . Number of Children: N/A  . Years of Education: N/A   Occupational History  . Not on file.   Social History   Main Topics  . Smoking status: Never Smoker   . Smokeless tobacco: Not on file  . Alcohol Use: 0.0 oz/week    0 Standard drinks or equivalent per week     Comment: Denies daily use, denies binge use  . Drug Use: No  . Sexual Activity: Not on file   Other Topics Concern  . Not on file   Social History Narrative  . No narrative on file    REVIEW OF SYSTEMS: Constitutional:  Weight stable.  No profound fatigue or weakness ENT:  No nose bleeds Pulm:  No SOB or cough CV:  No palpitations, no LE edema.  GU:  No hematuria, no frequency GI:  Per HPI.  Denies hx of liver disease Heme:  No excessive bleeding or bruising   Transfusions:  1 in 06/2015 Neuro:  No headaches, no peripheral tingling or numbness Derm:  No itching, no rash or sores.  Endocrine:  No sweats or chills.  No polyuria or dysuria Immunization:  Not queried Travel:  None beyond local counties in last few months.    PHYSICAL EXAM: Vital signs in last 24 hours: Filed Vitals:   11/04/15 0614 11/04/15 0800  BP: 138/74 113/63  Pulse: 116 107  Temp: 98.4 F (36.9 C) 98.6 F (37 C)    Resp: 14 13   Wt Readings from Last 3 Encounters:  11/04/15 70.8 kg (156 lb 1.4 oz)    General: somewhat pale, comfortable Head:  No trauma  Eyes:  No icterus or pallor Ears:  Not HOH  Nose:  No discharge Mouth:  Clear, moist Neck:  No mass or TMG Lungs:  Clear bil.   No dyspnea or cough Heart: RRR.  No MRG.  S1/S2 present Abdomen:  Soft, NT, ND.  No mass or HSM.  Active BS.   Rectal: deferred   Musc/Skeltl: no joint swelling, tenderness of deformity Extremities:  No CCE  Neurologic:  Oriented x 3.  Alert.  Moves all 4s.  No tremor Skin:  No telangectasia or sores.  No rash   Psych:  Anxious. Cooperative.   Intake/Output from previous day:   Intake/Output this shift:    LAB RESULTS: No results for input(s): WBC, HGB, HCT, PLT in the last 72 hours. BMET No results found for: NA, K, CL, CO2, GLUCOSE, BUN, CREATININE, CALCIUM LFT No results for input(s): PROT, ALBUMIN, AST, ALT, ALKPHOS, BILITOT, BILIDIR, IBILI in the last 72 hours. PT/INR No results found for: INR, PROTIME Hepatitis Panel No results for input(s): HEPBSAG, HCVAB, HEPAIGM, HEPBIGM in the last 72 hours. C-Diff No components found for: CDIFF Lipase  No results found for: LIPASE  Drugs of Abuse  No results found for: LABOPIA, COCAINSCRNUR, LABBENZ, AMPHETMU, THCU, LABBARB   RADIOLOGY STUDIES: No results found.  ENDOSCOPIC STUDIES: Per HPI  IMPRESSION:   *  UGIB in pt with hx NSAID ulcer and GIB in 06/2015.  Claims she ran out of Protonix about 1 week ago.    *  ETOH abuse.  Pt minimizes but elevated ETOH level and SGOT>> SGPT.  Fatty liver on 06/2015 ultrasond.     PLAN:     *  EGD, timing TBD.  PPI IV BID for now   Tyiana Hill  11/04/2015, 8:48 AM Pager: [phone]      

## 2015-11-04 NOTE — Progress Notes (Signed)
[facility] TEAM 1 - Stepdown/ICU TEAM Progress Note  Tina Haley ZOX:[id number] DOB: [birth year] DOA: 11/04/2015 PCP: [name], MD  Admit HPI / Brief Narrative: 50 y.o. WF PMHx GIB in December secondary to Gastric ulcer who presents with coffee ground emesis.  Admitted to the hospital last December for NSAID-induced ulcer and hematemesis. She had a hemoglobin nadir of 7.9, and received transfusion of 1 unit of packed red blood cells. She had an upper endoscopy that showed no varices and no intervention was required. She was treated conservatively with pantoprazole, and discharged on PPI once daily.  Since then, she has had no problems until yesterday in the afternoon she developed intense sharp right upper quadrant pain that came and went. It is not associated with food, movement, or exertion. Sometime after that she got nauseous and vomited what appeared to be coffee-ground emesis. She went to the ER, but it was so carotid that she said over the home and so she left. However when she had her husband returned home, she had several more episodes of "a lot" of black tarry vomit, and then a black sticky bowel movement, and so they went to the ER again.  In the ED, she was tachycardic to 128, but blood pressure was normal. Renal function normal. WBC 11 K, Hgb 12.9, platelets normal. She is not on x-ray or anticoagulants. She denies recent NSAID use. Inside-out, I was told that the patient drinks alcohol heavily, but the patient denies this.  She was given pantoprazole 40 mg IV and IV fluids, and TRH were asked to admit in transfer because of lack of GI coverage at [facility] this week.  HPI/Subjective: 4/13 A/O 4, positive abdominal pain, positive nausea, negative vomiting since arrival to hospital. States stopped her drinking in October however a couple of days ago friends came over and she had several beers (2-3), after which began to have coffee ground emesis, positive melena. Also  states has not been taking her Protonix for~a week. Also states has not had her screening Colonoscopy.    Assessment/Plan: Upper GI bleed:  This is new. No obvious etiology, no NSAID use, patient denies alcohol.  -Maintain 2 PIV -Type and screen -M IVF and NPO -GI to take patient for EGD later today or in Am; recommend they perform screening: Colonoscopy at the same time unlikely to return for outpatient procedure -Pantoprazole 40 mg IV BID   Possible alcohol withdrawal:  -Patient denies regular alcohol use or history of withdrawal, but appears somewhat tremulous, tachycardic. -Continue CIWA for now given EDP at [facility] report of heavier alcohol use than patient is disclosing to me. -EtOH level pending -UDS pending    Code Status: FULL Family Communication: no family present at time of exam Disposition Plan: Resolution GI bleed    Consultants: Dr.[name] GI   Procedure/Significant Events:    Culture NA  Antibiotics: NA  DVT prophylaxis: SCD   Devices NA   LINES / TUBES:  NA    Continuous Infusions: . sodium chloride 125 mL/hr at 11/04/15 0635    Objective: VITAL SIGNS: Temp: 98.1 F (36.7 C) (04/13 1200) Temp Source: Oral (04/13 1200) BP: 143/68 mmHg (04/13 1200) Pulse Rate: 125 (04/13 1200) SPO2; FIO2:  No intake or output data in the 24 hours ending 11/04/15 1422   Exam: General:  A/O 4, positive abdominal pain, positive nausea, No acute respiratory distress Eyes: Negative headache, negative scleral hemorrhage ENT: Negative Runny nose, negative gingival bleeding, Neck:  Negative scars, masses,  torticollis, lymphadenopathy, JVD Lungs:  Clear to auscultation bilaterally without wheezes or crackles Cardiovascular:  tachycardic,Regular rhythm without murmur gallop or rub normal S1 and S2 Abdomen: Positive right upper quadrant  abdominal pain, nondistended, positive soft, bowel sounds, no rebound, no ascites, no  appreciable mass Extremities: No significant cyanosis, clubbing, or edema bilateral lower extremities Psychiatric:  Negative depression, negative anxiety, negative fatigue, negative mania  Neurologic:  Cranial nerves II through XII intact, tongue/uvula midline, all extremities muscle strength 5/5, sensation intact throughout, negative dysarthria, negative expressive aphasia, negative receptive aphasia.Patient had some resting tremors.   Data Reviewed: Basic Metabolic Panel:  Recent Labs Lab 11/04/15 0845  NA 144  K 4.2  CL 107  CO2 22  GLUCOSE 119*  BUN 12  CREATININE 0.49  CALCIUM 8.2*  MG 1.4*   Liver Function Tests:  Recent Labs Lab 11/04/15 0845  AST 73*  ALT 26  ALKPHOS 87  BILITOT 1.7*  PROT 6.3*  ALBUMIN 2.9*   No results for input(s): LIPASE, AMYLASE in the last 168 hours. No results for input(s): AMMONIA in the last 168 hours. CBC:  Recent Labs Lab 11/04/15 0845  WBC 8.5  HGB 10.5*  HCT 33.7*  MCV 100.0  PLT 164   Cardiac Enzymes: No results for input(s): CKTOTAL, CKMB, CKMBINDEX, TROPONINI in the last 168 hours. BNP (last 3 results) No results for input(s): BNP in the last 8760 hours.  ProBNP (last 3 results) No results for input(s): PROBNP in the last 8760 hours.  CBG:  Recent Labs Lab 11/04/15 0916 11/04/15 1216  GLUCAP 103* 97    Recent Results (from the past 240 hour(s))  MRSA PCR Screening     Status: None   Collection Time: 11/04/15  6:13 AM  Result Value Ref Range Status   MRSA by PCR NEGATIVE NEGATIVE Final    Comment:        The GeneXpert MRSA Assay (FDA approved for NASAL specimens only), is one component of a comprehensive MRSA colonization surveillance program. It is not intended to diagnose MRSA infection nor to guide or monitor treatment for MRSA infections.      Studies:  Recent x-ray studies have been reviewed in detail by the Attending Physician  Scheduled Meds:  Scheduled Meds: . [MAR Hold] folic acid   1 mg Oral Daily  . [MAR Hold] multivitamin with minerals  1 tablet Oral Daily  . [MAR Hold] pantoprazole (PROTONIX) IV  40 mg Intravenous Q12H  . [MAR Hold] sodium chloride flush  3 mL Intravenous Q12H  . [MAR Hold] thiamine  100 mg Oral Daily   Or  . [MAR Hold] thiamine  100 mg Intravenous Daily    Time spent on care of this patient: 40 mins   Kaiyon Hynes, Roselind MessierURTIS J , MD  [facility] Office  [phone] Pager [phone]  On-Call/Text Page:      [email or web address]      password [email or web address]  If 7PM-7AM, please contact night-coverage [email or web address] Password [email or web address] 11/04/2015, 2:22 PM   LOS: 0 days   Care during the described time interval was provided by me .  I have reviewed this patient's available data, including medical history, events of note, physical examination, and all test results as part of my evaluation. I have personally reviewed and interpreted all radiology studies.   Carolyne Littlesurtis Macallan Ord, MD [phone] Pager

## 2015-11-05 ENCOUNTER — Inpatient Hospital Stay (HOSPITAL_COMMUNITY): Payer: Self-pay

## 2015-11-05 DIAGNOSIS — I8511 Secondary esophageal varices with bleeding: Secondary | ICD-10-CM

## 2015-11-05 DIAGNOSIS — F1010 Alcohol abuse, uncomplicated: Secondary | ICD-10-CM

## 2015-11-05 LAB — CBC
HCT: 33 % — ABNORMAL LOW (ref 36.0–46.0)
Hemoglobin: 10.2 g/dL — ABNORMAL LOW (ref 12.0–15.0)
MCH: 30.9 pg (ref 26.0–34.0)
MCHC: 30.9 g/dL (ref 30.0–36.0)
MCV: 100 fL (ref 78.0–100.0)
Platelets: 105 10*3/uL — ABNORMAL LOW (ref 150–400)
RBC: 3.3 MIL/uL — ABNORMAL LOW (ref 3.87–5.11)
RDW: 20.1 % — ABNORMAL HIGH (ref 11.5–15.5)
WBC: 6.3 10*3/uL (ref 4.0–10.5)

## 2015-11-05 LAB — GLUCOSE, CAPILLARY: Glucose-Capillary: 127 mg/dL — ABNORMAL HIGH (ref 65–99)

## 2015-11-05 LAB — BASIC METABOLIC PANEL
Anion gap: 11 (ref 5–15)
BUN: 10 mg/dL (ref 6–20)
CO2: 22 mmol/L (ref 22–32)
Calcium: 7.9 mg/dL — ABNORMAL LOW (ref 8.9–10.3)
Chloride: 106 mmol/L (ref 101–111)
Creatinine, Ser: 0.71 mg/dL (ref 0.44–1.00)
GFR calc Af Amer: >60 mL/min (ref >60)
GFR calc non Af Amer: >60 mL/min (ref >60)
Glucose, Bld: 126 mg/dL — ABNORMAL HIGH (ref 65–99)
Potassium: 4 mmol/L (ref 3.5–5.1)
Sodium: 139 mmol/L (ref 135–145)

## 2015-11-05 LAB — MAGNESIUM: Magnesium: 2 mg/dL (ref 1.7–2.4)

## 2015-11-05 LAB — TROPONIN I
Troponin I: <0.03 ng/mL (ref <0.031)
Troponin I: <0.03 ng/mL (ref <0.031)

## 2015-11-05 LAB — HEPATITIS PANEL, ACUTE
HCV Ab: 0.1 s/co ratio (ref 0.0–0.9)
Hep A IgM: NEGATIVE
Hep B C IgM: NEGATIVE
Hepatitis B Surface Ag: NEGATIVE

## 2015-11-05 LAB — PHOSPHORUS: Phosphorus: 1.8 mg/dL — ABNORMAL LOW (ref 2.5–4.6)

## 2015-11-05 MED ORDER — ACETAMINOPHEN 500 MG PO TABS
500.0000 mg | ORAL_TABLET | Freq: Once | ORAL | Status: AC
  Administered 2015-11-05: 500 mg via ORAL
  Filled 2015-11-05: qty 1

## 2015-11-05 NOTE — Progress Notes (Signed)
  [facility] TEAM 1 - Stepdown/ICU TEAM Progress Note  Minna AntisKathy Nordmann ZOX:[id number] DOB: August 29, 1965 DOA: 11/04/2015 PCP: [name],Brian, MD  Admit HPI / Brief Narrative: 50 y.o. WF PMHx GIB in December secondary to Gastric ulcer who presents with coffee ground emesis.  Admitted to the hospital last December for NSAID-induced ulcer and hematemesis. She had a hemoglobin nadir of 7.9, and received transfusion of 1 unit of packed red blood cells. She had an upper endoscopy that showed no varices and no intervention was required. She was treated conservatively with pantoprazole, and discharged on PPI once daily.  Since then, she has had no problems until yesterday in the afternoon she developed intense sharp right upper quadrant pain that came and went. It is not associated with food, movement, or exertion. Sometime after that she got nauseous and vomited what appeared to be coffee-ground emesis. She went to the ER, but it was so carotid that she said over the home and so she left. However when she had her husband returned home, she had several more episodes of "a lot" of black tarry vomit, and then a black sticky bowel movement, and so they went to the ER again.  In the ED, she was tachycardic to 128, but blood pressure was normal. Renal function normal. WBC 11 K, Hgb 12.9, platelets normal. She is not on x-ray or anticoagulants. She denies recent NSAID use. Inside-out, I was told that the patient drinks alcohol heavily, but the patient denies this.  She was given pantoprazole 40 mg IV and IV fluids, and TRH were asked to admit in transfer because of lack of GI coverage at [facility] this week.  HPI/Subjective: 4/14 [facility] both with full notes on patient. Both agreed patient could come to stepdown unit. Orders were not written have spoken with RN Maylon PeppersKatrice, will write orders for transfer to step down unit. Have not physically evaluated patient secondary [facility]  [facility] both evaluating and writing assessment and plan on patient. NO CHARGE

## 2015-11-05 NOTE — Progress Notes (Signed)
          Daily Rounding Note  11/05/2015, 8:22 AM  LOS: 1 day   SUBJECTIVE:       No nausea.  Throat into chest feels sore.  No abd pain.  No BMs.  RN says not overt signs of withdrawal other than fine tremor  OBJECTIVE:         Vital signs in last 24 hours:    Temp:  [97.8 F (36.6 C)-98.7 F (37.1 C)] 97.8 F (36.6 C) (04/14 0700) Pulse Rate:  [84-171] 89 (04/14 0700) Resp:  [10-28] 12 (04/14 0700) BP: (117-213)/(58-121) 120/75 mmHg (04/14 0700) SpO2:  [93 %-100 %] 95 % (04/14 0700) Weight:  [74.5 kg (164 lb 3.9 oz)] 74.5 kg (164 lb 3.9 oz) (04/14 0500) Last BM Date: 11/04/15 Filed Weights   11/04/15 0614 11/05/15 0500  Weight: 70.8 kg (156 lb 1.4 oz) 74.5 kg (164 lb 3.9 oz)   General: looks unwell.  Alert, comfortable   Heart: RRR Chest: clear bil.  No cough or SOB Abdomen: soft, NT, ND.  No mass or HSM.  Active BS  Extremities: no CCE Neuro/Psych:  Oriented x 3.  Alert, moves all 4s.  No tremor.   Intake/Output from previous day: 04/13 0701 - 04/14 0700 In: 3236.4 [I.V.:1810.4; IV Piggyback:1306] Out: -   Intake/Output this shift:    Lab Results:  Recent Labs  11/04/15 1751 11/04/15 2213 11/05/15 0208  WBC 7.5 6.0 6.3  HGB 9.7* 9.9* 10.2*  HCT 30.7* 30.3* 33.0*  PLT 135* 122* 105*   BMET  Recent Labs  11/04/15 0845 11/04/15 1751  NA 144 137  K 4.2 3.5  CL 107 105  CO2 22 23  GLUCOSE 119* 100*  BUN 12 10  CREATININE 0.49 0.65  CALCIUM 8.2* 8.1*   LFT  Recent Labs  11/04/15 0845 11/04/15 1751  PROT 6.3* 5.8*  ALBUMIN 2.9* 2.8*  AST 73* 63*  ALT 26 22  ALKPHOS 87 77  BILITOT 1.7* 2.1*   PT/INR  Recent Labs  11/04/15 0845 11/04/15 2020  LABPROT 20.5* 19.0*  INR 1.76* 1.59*   Hepatitis Panel  Recent Labs  11/04/15 1324  HEPBSAG Negative  HCVAB 0.1  HEPAIGM Negative  HEPBIGM Negative    Studies/Results: No results found.  ASSESMENT:   *  cofee like emesis, melena.   Bleeding GU in 06/2015 EGD:  Non-bleeding gastric ulcers; large, bleeding esophageal varix banded x 2.  Blood in fundus.   Octreotide, PPI drips and daily Rocephin in place.  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  Hepatitis ABC serologies negative.   *  Blood loss anemia. No need of transfsuions to date.   *  Coagulopathy.  S/p single dose IV Vit K 4/14 PM.    *   Thrombocytopenia.   *  Alcoholism.  Pt in denial, minimizes intake, [facility].  Marland Kitchen.    PLAN   *  72 hours octreotide.   *  Abdominal ultrasound.   Start clears.  Will change to BID PPI tomorrow.    *  Ok to transfer to SDU.    *  CBC in AM , coags in AM.      Tina MoccasinSarah Dusan Haley  11/05/2015, 8:22 AM Pager: [phone]

## 2015-11-05 NOTE — Progress Notes (Signed)
Patient transferred from 17M. Alert and oriented x4. No signs of acute distress. Oriented to room and plan of care.

## 2015-11-05 NOTE — Progress Notes (Signed)
[facility] from [facility] ,[place] for transfer to [phone]

## 2015-11-05 NOTE — Progress Notes (Signed)
PULMONARY / CRITICAL CARE MEDICINE   Name: Tina Haley MRN: [id number] DOB: [birth year]    ADMISSION DATE:  11/04/2015 CONSULTATION DATE:  11/04/2015  REFERRING MD:  Joseph Art  CHIEF COMPLAINT:  GIB  HISTORY OF PRESENT ILLNESS:   50 year old female with PMH as below, which includes ETOH abuse, fatty liver, and anxiety. She was admitted for GIB (NSAID induced) in 06/2015, during which she underwent EGD and no varices were noted. No intervention as required and conservative management was recommended. She was discharged on daily PPI from former GIB.  Medical course has been uncomplicated since that time until 4/12 when she developed intermittent RUQ pain. She also had several episodes of coffee-ground emesis and dark tarry stool. She presented to the ED with these complaints where she was found to be tachycardic to 128, but normotensive. At that time the patient denied NSAIDS, ETOH, and anticoagulants, however, family members reported that she drinks rather heavily. She was seen by GI and started on PPI. Taken for endoscopy where she was found to have on large esophageal varix, and underwent banding x2. There were 2 gastric ulcers found, but no stigmata of bleeding. Due to large blood loss patient was send to ICU post-procedurally.   SUBJECTIVE:  Awake, hemodynamically stable, comfortable.  Remains on octreotide No evidence continued blood loss.   VITAL SIGNS: BP 120/75 mmHg  Pulse 89  Temp(Src) 97.8 F (36.6 C) (Oral)  Resp 12  Ht  (1.6 m)  Wt 74.5 kg (164 lb 3.9 oz)  BMI 29.10 kg/m2  SpO2 95%  HEMODYNAMICS:    VENTILATOR SETTINGS:    INTAKE / OUTPUT: I/O last 3 completed shifts: In: 3236.4 [I.V.:1810.4; Other:120; IV Piggyback:1306] Out: -   PHYSICAL EXAMINATION: General:  Overweight female in NAD Neuro:  Alert, oriented, non-focal HEENT:  [place]/AT, PERRL, no JVD noted Cardiovascular:  RRR, no MRG Lungs:  Clear bilateral breath sounds Abdomen:  Soft, non-tender,  non-distended Musculoskeletal:  No acute deformity, ROM limitation,  or edema Skin:  Grossly intact  LABS:  BMET  Recent Labs Lab 11/04/15 0845 11/04/15 1751 11/05/15 0850  NA 144 137 139  K 4.2 3.5 4.0  CL 107 105 106  CO2 BUN CREATININE 0.49 0.65 0.71  GLUCOSE 119* 100* 126*    Electrolytes  Recent Labs Lab 11/04/15 0845 11/04/15 1751 11/05/15 0850  CALCIUM 8.2* 8.1* 7.9*  MG 1.4* 1.5* 2.0  PHOS  --  2.1* 1.8*    CBC  Recent Labs Lab 11/04/15 1751 11/04/15 2213 11/05/15 0208  WBC 7.5 6.0 6.3  HGB 9.7* 9.9* 10.2*  HCT 30.7* 30.3* 33.0*  PLT 135* 122* 105*    Coag's  Recent Labs Lab 11/04/15 0845 11/04/15 2020  APTT 36 33  INR 1.76* 1.59*    Sepsis Markers No results for input(s): LATICACIDVEN, PROCALCITON, O2SATVEN in the last 168 hours.  ABG No results for input(s): PHART, PCO2ART, PO2ART in the last 168 hours.  Liver Enzymes  Recent Labs Lab 11/04/15 0845 11/04/15 1751  AST 73* 63*  ALT 26 22  ALKPHOS 87 77  BILITOT 1.7* 2.1*  ALBUMIN 2.9* 2.8*    Cardiac Enzymes  Recent Labs Lab 11/04/15 2020 11/05/15 0208 11/05/15 0850  TROPONINI <0.03 <0.03 <0.03    Glucose  Recent Labs Lab 11/04/15 0916 11/04/15 1216 11/05/15 0809  GLUCAP 103* 97 127*    Imaging No results found.   STUDIES:  EGD 4/13 > one large bleeding varix  banding x 2. Non-bleeding gastric ulcers  CULTURES: none  ANTIBIOTICS: Rocephin 4/13>>>  SIGNIFICANT EVENTS: 4/13 admit, EDG, variceal bleed  LINES/TUBES: PIV x2  DISCUSSION: 50 year old female with suspected ETOH abuse admitted for GIB. Found to have bleeding varix. Banded x 2 4/13. Due to significant blood loss was transferred to the ICU post procedure. Will monitor in ICU overnight.   ASSESSMENT / PLAN:  GI bleed secondary to bleeding varix s/p banding 4/13, also noted 2 gastric ulcerations Acute blood loss anemia - GI following - CBC q 4 hours overnight, can  probably decrease to q12h - Transfuse for Hgb < 8 - PPI BID - Octreotide infusion, likely finish today - Continue ceftriaxone per GI  ETOH abuse patient reports only 3-4 drinks per week, but reported  - monitor for signs of withdrawal - CIWA protocol - Thiamine - Folate   Diet: OK'd for diet by GI this am Prophylaxis:  SCDs & Protonix IV q12hr   Stable for transfer to floor bed from my perspective today. Will notify [facility] of her status.  PCCM will sign off, please call if needed.    Levy Pupaobert Michaella Imai, MD, PhD 11/05/2015, 11:55 AM [facility] [phone] or if no answer [phone]

## 2015-11-06 ENCOUNTER — Inpatient Hospital Stay (HOSPITAL_COMMUNITY): Payer: Self-pay

## 2015-11-06 DIAGNOSIS — I8500 Esophageal varices without bleeding: Secondary | ICD-10-CM | POA: Diagnosis present

## 2015-11-06 DIAGNOSIS — F1099 Alcohol use, unspecified with unspecified alcohol-induced disorder: Secondary | ICD-10-CM

## 2015-11-06 DIAGNOSIS — R188 Other ascites: Secondary | ICD-10-CM

## 2015-11-06 DIAGNOSIS — K766 Portal hypertension: Secondary | ICD-10-CM

## 2015-11-06 LAB — BASIC METABOLIC PANEL
Anion gap: 9 (ref 5–15)
BUN: <5 mg/dL — ABNORMAL LOW (ref 6–20)
CO2: 22 mmol/L (ref 22–32)
Calcium: 7.8 mg/dL — ABNORMAL LOW (ref 8.9–10.3)
Chloride: 106 mmol/L (ref 101–111)
Creatinine, Ser: 0.59 mg/dL (ref 0.44–1.00)
GFR calc Af Amer: >60 mL/min (ref >60)
GFR calc non Af Amer: >60 mL/min (ref >60)
Glucose, Bld: 123 mg/dL — ABNORMAL HIGH (ref 65–99)
Potassium: 3.7 mmol/L (ref 3.5–5.1)
Sodium: 137 mmol/L (ref 135–145)

## 2015-11-06 LAB — MAGNESIUM: Magnesium: 1.7 mg/dL (ref 1.7–2.4)

## 2015-11-06 LAB — GLUCOSE, CAPILLARY: Glucose-Capillary: 158 mg/dL — ABNORMAL HIGH (ref 65–99)

## 2015-11-06 MED ORDER — PANTOPRAZOLE SODIUM 40 MG PO TBEC
40.0000 mg | DELAYED_RELEASE_TABLET | Freq: Two times a day (BID) | ORAL | Status: DC
  Administered 2015-11-06 – 2015-11-07 (×3): 40 mg via ORAL
  Filled 2015-11-06 (×3): qty 1

## 2015-11-06 MED ORDER — PRENATAL MULTIVITAMIN CH
1.0000 | ORAL_TABLET | Freq: Every day | ORAL | Status: DC
  Administered 2015-11-06 – 2015-11-07 (×2): 1 via ORAL
  Filled 2015-11-06 (×2): qty 1

## 2015-11-06 NOTE — Progress Notes (Signed)
[place] TEAM 1 - Stepdown/ICU TEAM Progress Note  Tina Haley ZOX:[id number] DOB: 04-21-1966 DOA: 11/04/2015 PCP: [name],Brian, MD  Admit HPI / Brief Narrative: 50 y.o. WF PMHx GIB in December secondary to Gastric ulcer who presents with coffee ground emesis.  Admitted to the hospital last December for NSAID-induced ulcer and hematemesis. She had a hemoglobin nadir of 7.9, and received transfusion of 1 unit of packed red blood cells. She had an upper endoscopy that showed no varices and no intervention was required. She was treated conservatively with pantoprazole, and discharged on PPI once daily.  Since then, she has had no problems until yesterday in the afternoon she developed intense sharp right upper quadrant pain that came and went. It is not associated with food, movement, or exertion. Sometime after that she got nauseous and vomited what appeared to be coffee-ground emesis. She went to the ER, but it was so carotid that she said over the home and so she left. However when she had her husband returned home, she had several more episodes of "a lot" of black tarry vomit, and then a black sticky bowel movement, and so they went to the ER again.  In the ED, she was tachycardic to 128, but blood pressure was normal. Renal function normal. WBC 11 K, Hgb 12.9, platelets normal. She is not on x-ray or anticoagulants. She denies recent NSAID use. Inside-out, I was told that the patient drinks alcohol heavily, but the patient denies this.  She was given pantoprazole 40 mg IV and IV fluids, and [facility] were asked to admit in transfer because of lack of [facility] this [facility].   HPI/Subjective: 4/15 A/O 4, positive abdominal pressure, positive esophageal discomfort (consistent with banded esophageal varices) negative nausea, negative vomiting since arrival to hospital.    Assessment/Plan: Upper GI bleed/Esophageal varices/Gastric ulcer:  This is new. No obvious etiology,  no NSAID use, patient denies alcohol.  -Protonix 40 mg BID -Octreotide drip, continue until DC'd by GI  Alcohol abuse/Possible alcohol withdrawal:  -Patient denies regular alcohol use or history of withdrawal, but appears somewhat tremulous, tachycardic. -Continue CIWA for now given [facility] report of heavier alcohol use than patient is disclosing to me. -EtOH level pending -UDS pending  Alcoholic liver cirrhosis with ascites/Portal hypertension -Patient have US paracentesis in A.m. If lab unable to accommodate will perform bedside paracentesis -Patient counseled extensively on sequela of continuing to abuse alcohol to include death    Code Status: FULL Family Communication: Husband, parents present at time of exam Disposition Plan: Resolution GI bleed    Consultants: Dr.Steven Maylon PeppersPaul [facility]   Procedure/Significant Events: 4/13 EGD;- One large esophageal varix, actively bleeding, Banded x 2 with hemostasis - Non-bleeding gastric ulcers with no stigmata of bleeding.- Red blood in the gastric fundus/gastric body.     Culture NA  Antibiotics: NA  DVT prophylaxis: SCD   Devices NA   LINES / TUBES:  NA    Continuous Infusions: . sodium chloride 75 mL/hr at 11/06/15 1158  . octreotide  (SANDOSTATIN)    IV infusion 50 mcg/hr (11/06/15 1158)    Objective: VITAL SIGNS: Temp: 97.4 F (36.3 C) (04/15 1351) BP: 121/75 mmHg (04/15 1351) Pulse Rate: 87 (04/15 1351) SPO2; FIO2:   Intake/Output Summary (Last 24 hours) at 11/06/15 1741 Last data filed at 11/06/15 0959  Gross per 24 hour  Intake   1020 ml  Output      0 ml  Net   1020  ml     Exam: General:  A/O 4, positive abdominal pain(Described as pressure), negative nausea, No acute respiratory distress Eyes: Negative headache, negative scleral hemorrhage ENT: Negative Runny nose, negative gingival bleeding, Neck:  Negative scars, masses, torticollis,  lymphadenopathy, JVD Lungs:  Clear to auscultation bilaterally without wheezes or crackles Cardiovascular:  Regular rhythm and rate without murmur gallop or rub normal S1 and S2 Abdomen: Positive right upper quadrant  abdominal tenderness, positive distended, positive soft, bowel sounds, no rebound, no ascites, no appreciable mass Extremities: No significant cyanosis, clubbing, or edema bilateral lower extremities Psychiatric:  Negative depression, negative anxiety, negative fatigue, negative mania  Neurologic:  Cranial nerves II through XII intact, tongue/uvula midline, all extremities muscle strength 5/5, sensation intact throughout, negative dysarthria, negative expressive aphasia, negative receptive aphasia.Patient had some resting tremors.   Data Reviewed: Basic Metabolic Panel:  Recent Labs Lab 11/04/15 0845 11/04/15 1751 11/05/15 0850 11/06/15 0615  NA 144 137 139 137  K 4.2 3.5 4.0 3.7  CL 107 105 106 106  CO2 GLUCOSE 119* 100* 126* 123*  BUN <5*  CREATININE 0.49 0.65 0.71 0.59  CALCIUM 8.2* 8.1* 7.9* 7.8*  MG 1.4* 1.5* 2.0 1.7  PHOS  --  2.1* 1.8*  --    Liver Function Tests:  Recent Labs Lab 11/04/15 0845 11/04/15 1751  AST 73* 63*  ALT 26 22  ALKPHOS 87 77  BILITOT 1.7* 2.1*  PROT 6.3* 5.8*  ALBUMIN 2.9* 2.8*   No results for input(s): LIPASE, AMYLASE in the last 168 hours. No results for input(s): AMMONIA in the last 168 hours. CBC:  Recent Labs Lab 11/04/15 0845 11/04/15 1625 11/04/15 1751 11/04/15 2213 11/05/15 0208  WBC 8.5  --  7.5 6.0 6.3  HGB 10.5* 10.6* 9.7* 9.9* 10.2*  HCT 33.7* 33.6* 30.7* 30.3* 33.0*  MCV 100.0  --  99.4 98.1 100.0  PLT 164  --  135* 122* 105*   Cardiac Enzymes:  Recent Labs Lab 11/04/15 2020 11/05/15 0208 11/05/15 0850  TROPONINI <0.03 <0.03 <0.03   BNP (last 3 results) No results for input(s): BNP in the last 8760 hours.  ProBNP (last 3 results) No results for input(s): PROBNP in the  last 8760 hours.  CBG:  Recent Labs Lab 11/04/15 0916 11/04/15 1216 11/05/15 0809 11/06/15 0736  GLUCAP 103* 97 127* 158*    Recent Results (from the past 240 hour(s))  MRSA PCR Screening     Status: None   Collection Time: 11/04/15  6:13 AM  Result Value Ref Range Status   MRSA by PCR NEGATIVE NEGATIVE Final    Comment:        The GeneXpert MRSA Assay (FDA approved for NASAL specimens only), is one component of a comprehensive MRSA colonization surveillance program. It is not intended to diagnose MRSA infection nor to guide or monitor treatment for MRSA infections.      Studies:  Recent x-ray studies have been reviewed in detail by the Attending Physician  Scheduled Meds:  Scheduled Meds: . cefTRIAXone (ROCEPHIN)  IV  1 g Intravenous Q24H  . pantoprazole  40 mg Oral BID  . prenatal multivitamin  1 tablet Oral Q1200  . sodium chloride flush  3 mL Intravenous Q12H    Time spent on care of this patient: 40 mins   WOODS, Roselind Messier , MD  [facility] Office  [phone] Pager - [phone]  On-Call/Text Page:      [email or web address]  password [email or web address]  If 7PM-7AM, please contact night-coverage [email or web address] Password [email or web address] 11/06/2015, 5:41 PM   LOS: 2 days   Care during the described time interval was provided by me .  I have reviewed this patient's available data, including medical history, events of note, physical examination, and all test results as part of my evaluation. I have personally reviewed and interpreted all radiology studies.   Carolyne Littles, MD [phone] Pager

## 2015-11-06 NOTE — Progress Notes (Signed)
Daily Rounding Note  11/06/2015, 1:25 PM  LOS: 2 days   SUBJECTIVE:       No stools.  Upper abdominal fullness and discomfort, worse after she eats.  No nausea or emesis.  Overall feels better  OBJECTIVE:         Vital signs in last 24 hours:    Temp:  [98.1 F (36.7 C)-98.8 F (37.1 C)] 98.1 F (36.7 C) (04/15 0603) Pulse Rate:  [91-97] 91 (04/15 0603) Resp:  [15-18] 16 (04/15 0603) BP: (124-131)/(68-81) 124/72 mmHg (04/15 0603) SpO2:  [96 %-100 %] 96 % (04/15 0603) Weight:  [79.969 kg (176 lb 4.8 oz)] 79.969 kg (176 lb 4.8 oz) (04/14 1551) Last BM Date: 11/04/15 Filed Weights   11/04/15 0614 11/05/15 0500 11/05/15 1551  Weight: 70.8 kg (156 lb 1.4 oz) 74.5 kg (164 lb 3.9 oz) 79.969 kg (176 lb 4.8 oz)   General: looks better but not well.    Heart: RRR Chest: clear bil.  Abdomen: soft, slight discomfort upper abdomen.  BS active  Extremities: no CCE Neuro/Psych:  Oriented x 3.  No tremor.  Moves all 4s.  Fully alert and engaged.    Intake/Output from previous day: 04/14 0701 - 04/15 0700 In: 3875 [I.V.:3875] Out: -   Intake/Output this shift: Total I/O In: 120 [P.O.:120] Out: -   Lab Results:  Recent Labs  11/04/15 1751 11/04/15 2213 11/05/15 0208  WBC 7.5 6.0 6.3  HGB 9.7* 9.9* 10.2*  HCT 30.7* 30.3* 33.0*  PLT 135* 122* 105*   BMET  Recent Labs  11/04/15 1751 11/05/15 0850 11/06/15 0615  NA 137 139 137  K 3.5 4.0 3.7  CL 105 106 106  CO2 GLUCOSE 100* 126* 123*  BUN 10 10 <5*  CREATININE 0.65 0.71 0.59  CALCIUM 8.1* 7.9* 7.8*   LFT  Recent Labs  11/04/15 0845 11/04/15 1751  PROT 6.3* 5.8*  ALBUMIN 2.9* 2.8*  AST 73* 63*  ALT 26 22  ALKPHOS 87 77  BILITOT 1.7* 2.1*   PT/INR  Recent Labs  11/04/15 0845 11/04/15 2020  LABPROT 20.5* 19.0*  INR 1.76* 1.59*   Hepatitis Panel  Recent Labs  11/04/15 1324  HEPBSAG Negative  HCVAB 0.1  HEPAIGM Negative    HEPBIGM Negative    Studies/Results: US Abdomen Limited  11/05/2015  CLINICAL DATA:  Abdominal distension, cirrhosis. EXAM: LIMITED ABDOMEN ULTRASOUND FOR ASCITES TECHNIQUE: Limited ultrasound survey for ascites was performed in all four abdominal quadrants. COMPARISON:  None. FINDINGS: Fluid is seen in all 4 quadrants of the abdomen. IMPRESSION: Ascites. Electronically Signed   By: Leanna Battles M.D.   On: 11/05/2015 20:04    ASSESMENT:   * cofee like emesis, melena. Bleeding GU in 06/2015 EGD: Non-bleeding gastric ulcers; large, bleeding esophageal varix banded x 2. Blood in fundus.  Octreotide, PPI drips and daily Rocephin in place.  * Hx alcoholic fatty liver, with the varices the dx is now that of cirrhosis. Hepatitis ABC serologies negative.   * Blood loss anemia. No need of transfsuions to date.   * Coagulopathy. S/p single dose IV Vit K 4/14 PM.   * Thrombocytopenia.   * Alcoholism. Pt in denial, minimizes intake, elevated etoh level at [facility] ED. Marland Kitchen     PLAN   *  Needs paracentesis and  Testing for SBP.  Ordered for tomorrow as u/s tech not able to arrange for today.   *  change to po BID PPI, maintain at that dose at discharge since still has ulcers despite her clain of mostly compliant with daily PPI except for 1 week PTA.   *  ultrasound of abdomen today, as tech did not assess liver and she had eaten so GB imaging would not have been accurate.  *  Do not think she needs tele anymore.   *  Will need EGD and possible repeat banding in a few weeks.  Dr Braulio [place]isenheimer [place].    *  etoh abstinence.  Would benefit from outpt rehap and AA.     Tina MoccasinSarah Kissa Haley  11/06/2015, 1:25 PM Pager: [phone]

## 2015-11-07 ENCOUNTER — Inpatient Hospital Stay (HOSPITAL_COMMUNITY): Payer: MEDICAID

## 2015-11-07 DIAGNOSIS — K7030 Alcoholic cirrhosis of liver without ascites: Secondary | ICD-10-CM | POA: Diagnosis present

## 2015-11-07 DIAGNOSIS — K7031 Alcoholic cirrhosis of liver with ascites: Secondary | ICD-10-CM

## 2015-11-07 DIAGNOSIS — K253 Acute gastric ulcer without hemorrhage or perforation: Secondary | ICD-10-CM

## 2015-11-07 DIAGNOSIS — I8500 Esophageal varices without bleeding: Secondary | ICD-10-CM

## 2015-11-07 LAB — BASIC METABOLIC PANEL
Anion gap: 9 (ref 5–15)
BUN: <5 mg/dL — ABNORMAL LOW (ref 6–20)
CO2: 24 mmol/L (ref 22–32)
Calcium: 8.1 mg/dL — ABNORMAL LOW (ref 8.9–10.3)
Chloride: 105 mmol/L (ref 101–111)
Creatinine, Ser: 0.53 mg/dL (ref 0.44–1.00)
GFR calc Af Amer: >60 mL/min (ref >60)
GFR calc non Af Amer: >60 mL/min (ref >60)
Glucose, Bld: 116 mg/dL — ABNORMAL HIGH (ref 65–99)
Potassium: 3.5 mmol/L (ref 3.5–5.1)
Sodium: 138 mmol/L (ref 135–145)

## 2015-11-07 LAB — CBC
HCT: 30.9 % — ABNORMAL LOW (ref 36.0–46.0)
Hemoglobin: 9.6 g/dL — ABNORMAL LOW (ref 12.0–15.0)
MCH: 30.6 pg (ref 26.0–34.0)
MCHC: 31.1 g/dL (ref 30.0–36.0)
MCV: 98.4 fL (ref 78.0–100.0)
Platelets: 95 10*3/uL — ABNORMAL LOW (ref 150–400)
RBC: 3.14 MIL/uL — ABNORMAL LOW (ref 3.87–5.11)
RDW: 18.5 % — ABNORMAL HIGH (ref 11.5–15.5)
WBC: 6.6 10*3/uL (ref 4.0–10.5)

## 2015-11-07 LAB — GLUCOSE, CAPILLARY: Glucose-Capillary: 108 mg/dL — ABNORMAL HIGH (ref 65–99)

## 2015-11-07 LAB — MAGNESIUM: Magnesium: 1.4 mg/dL — ABNORMAL LOW (ref 1.7–2.4)

## 2015-11-07 MED ORDER — NADOLOL 20 MG PO TABS: 20.0000 mg | ORAL_TABLET | Freq: Every day | ORAL | Status: DC

## 2015-11-07 MED ORDER — NADOLOL 20 MG PO TABS
20.0000 mg | ORAL_TABLET | Freq: Every day | ORAL | Status: DC
  Administered 2015-11-07: 20 mg via ORAL
  Filled 2015-11-07: qty 1

## 2015-11-07 MED ORDER — LIDOCAINE HCL (PF) 1 % IJ SOLN
INTRAMUSCULAR | Status: AC
  Filled 2015-11-07: qty 10

## 2015-11-07 MED ORDER — PANTOPRAZOLE SODIUM 40 MG PO TBEC: 40.0000 mg | DELAYED_RELEASE_TABLET | Freq: Two times a day (BID) | ORAL | Status: DC

## 2015-11-07 NOTE — Discharge Summary (Signed)
Physician Discharge Summary  Tina Haley:[id number]:[id number] DOB: 1966-06-04 DOA: 11/04/2015  PCP: Tina Haley,Brian, MD  Admit date: 11/04/2015 Discharge date: 11/07/2015  Time spent: 40 minutes  Recommendations for Outpatient Follow-up: Upper GI bleed/Esophageal varices/Gastric ulcer/  -Secondary to alcohol abuse. Patient has been counseled at nausea concerning the sequela of continuing to drink to include bleeding, ascites, hepatocellular carcinoma, death  -Protonix 40 mg BID x at least 8 weeks - avoid NSAIDs and no ETOH -H. pylori antibody versus biopsy at next upper endoscopy    Alcohol abuse/Possible alcohol withdrawal:  -Patient denies regular alcohol use or history of withdrawal, but appears somewhat tremulous, tachycardic. -Continue CI[facility] for [facility] use than patient is disclosing to me. -EtOH level pending -UDS pending  Alcoholic liver cirrhosis with ascites/Portal hypertension -Patient have US paracentesis in A.m. If lab unable to accommodate will perform bedside paracentesis -Patient counseled extensively on sequela of continuing to abuse alcohol to include death -Begin nadolol 20 mg daily, titrate to resting heart rate of 60 can be done at follow-up. Repeat EGD recommended in 2-4 weeks for banding protocol.  -Follow-up in one week with PCP to titrate above medication. -Ascites minimal so will not need diuretics at this point. Low sodium diet recommended -Follow-up with  Dr. Webb Tina Haley [facility]. Call on Monday to schedule follow-up for 2-4 weeks from today. Abdominal ultrasound recommends follow-up MRI discussed at follow-up appointment.    Discharge Diagnoses:  Active Problems:   Acute GI bleeding   Alcohol use disorder (HCC)   Alcohol abuse   Bleeding esophageal varices (HCC)   UGIB (upper gastrointestinal bleed)   Hypomagnesemia   Ascites   Portal hypertension with esophageal varices (HCC)   Cirrhosis, alcoholic  (HCC)   Discharge Condition: Stable  Diet recommendation: Heart healthy/low-sodium  Filed Weights   11/05/15 0500 11/05/15 1551 11/06/15 1700  Weight: 74.5 kg (164 lb 3.9 oz) 79.969 kg (176 lb 4.8 oz) 79.968 kg (176 lb 4.8 oz)    History of present illness:  50 y.o. WF PMHx GIB in December secondary to Gastric ulcer who presents with coffee ground emesis.  Admitted to the hospital last December for NSAID-induced ulcer and hematemesis. She had a hemoglobin nadir of 7.9, and received transfusion of 1 unit of packed red blood cells. She had an upper endoscopy that showed no varices and no intervention was required. She was treated conservatively with pantoprazole, and discharged on PPI once daily.  Since then, she has had no problems until yesterday in the afternoon she developed intense sharp right upper quadrant pain that came and went. It is not associated with food, movement, or exertion. Sometime after that she got nauseous and vomited what appeared to be coffee-ground emesis. She went to the ER, but it was so carotid that she said over the home and so she left. However when she had her husband returned home, she had several more episodes of "a lot" of black tarry vomit, and then a black sticky bowel movement, and so they went to the ER again.  In the ED, she was tachycardic to 128, but blood pressure was normal. Renal function normal. WBC 11 K, Hgb 12.9, platelets normal. She is not on x-ray or anticoagulants. She denies recent NSAID use. Inside-out, I was told that the patient drinks alcohol heavily, but the patient denies this.  She was given pantoprazole 40 mg IV and IV fluids, and TR[facility] were asked to admit in transfer because of lack of  GI coverage at [facility] this week. During his hospitalization EGD performed showing findings consistent with EtOH liver cirrhosis (esophageal varices) which were banded. Patient also received abdominal ultrasound showing EtOH cirrhosis and portal  hypertension. Patient will need to refrain from ANY FUTURE EtOH use, and follow-up closely with Dr. Webb Silversmith GI      Consultants: Dr.Steven Maylon Haley GI   Procedure/Significant Events: 4/13 EGD;- One large esophageal varix, actively bleeding, Banded x 2 with hemostasis - Non-bleeding gastric ulcers with no stigmata of bleeding.- Red blood in the gastric fundus/gastric body. 4/15 ultrasound abdomen:Cirrhosis. -no discrete liver mass is detected, liver parenchyma is diffusely markedly heterogeneous and a liver mass cannot be excluded on the basis of this scan. Recommend short-term outpatient evaluation with MRI abdomen without and with IV contrast. - Mild diffuse gallbladder wall thickening, with no cholelithiasis,pericholecystic fluid or sonographic Murphy's sign, favor noninflammatory gallbladder wall edema. -Mild splenomegaly.  4/16 ultrasound guided paracentesis; 5 mL peritoneal fluid removed   Discharge Exam: Filed Vitals:   11/07/15 0506 11/07/15 0830 11/07/15 0851 11/07/15 1337  BP: 108/56 123/96 137/70 134/73  Pulse: 87   77  Temp: 98.5 F (36.9 C)   98 F (36.7 C)  TempSrc: Oral   Oral  Resp: 16   19  Height:      Weight:      SpO2: 95%   100%    General: A/O 4, positive abdominal pain(Described as pressure), negative nausea, No acute respiratory distress Eyes: Negative headache, negative scleral hemorrhage ENT: Negative Runny nose, negative gingival bleeding, Neck: Negative scars, masses, torticollis, lymphadenopathy, JVD Lungs: Clear to auscultation bilaterally without wheezes or crackles Cardiovascular: Regular rhythm and rate without murmur gallop or rub normal S1 and S2 Abdomen: Positive right upper quadrant abdominal tenderness, positive distended, positive soft, bowel sounds, no rebound, no ascites, no appreciable mass   Discharge Instructions     Medication List    TAKE these medications         acetaminophen 325 MG tablet  Commonly known as:  TYLENOL  Take 650 mg by mouth every 6 (six) hours as needed for mild pain or moderate pain.     nadolol 20 MG tablet  Commonly known as:  CORGARD  Take 1 tablet (20 mg total) by mouth daily.     pantoprazole 40 MG tablet  Commonly known as:  PROTONIX  Take 1 tablet (40 mg total) by mouth 2 (two) times daily.     prenatal multivitamin Tabs tablet  Take 1 tablet by mouth daily at 12 noon.       No Known Allergies Follow-up Information    Follow up with Webb Silversmith, MD.   Specialty:  Unknown Physician Specialty   Why:  GI doctor.  follow up ulcers and liver disease.    Contact information:   [address] [place] [address] [phone]        The results of significant diagnostics from this hospitalization (including imaging, microbiology, ancillary and laboratory) are listed below for reference.    Significant Diagnostic Studies: US Abdomen Complete  11/06/2015  CLINICAL DATA:  Alcohol abuse.  Right upper quadrant abdominal pain. EXAM: ABDOMEN ULTRASOUND COMPLETE COMPARISON:  11/05/2015 limited abdominal sonogram. FINDINGS: Gallbladder: No gallstones. Mild diffuse gallbladder wall thickening. No pericholecystic fluid or sonographic Murphy's sign. Common bile duct: Diameter: 3 mm Liver: Liver parenchyma is diffusely markedly heterogeneous in echotexture and liver surface is diffusely mildly irregular, in keeping with cirrhosis. No discrete liver mass is demonstrated. IVC:  No abnormality visualized. Pancreas: Visualized portion unremarkable. Spleen: Mild splenomegaly (craniocaudal splenic length 13.4 cm with splenic volume 621 cc). No splenic mass. Right Kidney: Length: 11.7 cm. Echogenicity within normal limits. No mass or hydronephrosis visualized. Left Kidney: Length: 11.5 cm. Echogenicity within normal limits. No mass or hydronephrosis visualized. Abdominal aorta: No aneurysm visualized. Other findings: Small volume perihepatic  ascites. IMPRESSION: 1. Cirrhosis. Although no discrete liver mass is detected, liver parenchyma is diffusely markedly heterogeneous and a liver mass cannot be excluded on the basis of this scan. Recommend short-term outpatient evaluation with MRI abdomen without and with IV contrast. 2. Small volume perihepatic ascites. 3. Mild diffuse gallbladder wall thickening, with no cholelithiasis, pericholecystic fluid or sonographic Murphy's sign, favor noninflammatory gallbladder wall edema. 4. Mild splenomegaly. Electronically Signed   By: Delbert Phenix M.D.   On: 11/06/2015 18:38   US Abdomen Limited  11/05/2015  CLINICAL DATA:  Abdominal distension, cirrhosis. EXAM: LIMITED ABDOMEN ULTRASOUND FOR ASCITES TECHNIQUE: Limited ultrasound survey for ascites was performed in all four abdominal quadrants. COMPARISON:  None. FINDINGS: Fluid is seen in all 4 quadrants of the abdomen. IMPRESSION: Ascites. Electronically Signed   By: Leanna Battles M.D.   On: 11/05/2015 20:04   US Paracentesis  11/07/2015  INDICATION: Abd pain Ascites EXAM: ULTRASOUND-GUIDED PARACENTESIS COMPARISON:  None. MEDICATIONS: 10 cc 1% lidocaine COMPLICATIONS: None immediate. TECHNIQUE: Informed written consent was obtained from the patient after a discussion of the risks, benefits and alternatives to treatment. A timeout was performed prior to the initiation of the procedure. Initial ultrasound scanning demonstrates a small amount of ascites within the left lower abdominal quadrant. The left lower abdomen was prepped and draped in the usual sterile fashion. 1% lidocaine with epinephrine was used for local anesthesia. Under direct ultrasound guidance, a 19 gauge, 7-cm, Yueh catheter was introduced. An ultrasound image was saved for documentation purposed. The paracentesis was performed. Access into fluid was obtained using sterile probe guidance. 5 cc yellow fluid obtained. Pt very painful and moving on table. Access lost. Reattempt at access x 3;  never able to regain access; patient painful; stopped procedure. The catheter was removed and a dressing was applied. The patient tolerated the procedure well without immediate post procedural complication. FINDINGS: A total of approximately 5 cc of yellow fluid was removed. Samples were sent to the laboratory as requested by the clinical team. IMPRESSION: Successful ultrasound-guided paracentesis yielding 5 cc of peritoneal fluid. Read by:  Robet Leu [facility] Electronically Signed   By: Gilmer Mor D.O.   On: 11/07/2015 09:04    Microbiology: Recent Results (from the past 240 hour(s))  MRSA PCR Screening     Status: None   Collection Time: 11/04/15  6:13 AM  Result Value Ref Range Status   MRSA by PCR NEGATIVE NEGATIVE Final    Comment:        The GeneXpert MRSA Assay (FDA approved for NASAL specimens only), is one component of a comprehensive MRSA colonization surveillance program. It is not intended to diagnose MRSA infection nor to guide or monitor treatment for MRSA infections.      Labs: Basic Metabolic Panel:  Recent Labs Lab 11/04/15 0845 11/04/15 1751 11/05/15 0850 11/06/15 0615 11/07/15 0610  NA 144 137 139 137 138  K 4.2 3.5 4.0 3.7 3.5  CL 107 105 106 106 105  CO2 22 23 22 22 24   GLUCOSE 119* 100* 126* 123* 116*  BUN 12 10 10  <5* <5*  CREATININE 0.49 0.65 0.71  0.59 0.53  CALCIUM 8.2* 8.1* 7.9* 7.8* 8.1*  MG 1.4* 1.5* 2.0 1.7 1.4*  PHOS  --  2.1* 1.8*  --   --    Liver Function Tests:  Recent Labs Lab 11/04/15 0845 11/04/15 1751  AST 73* 63*  ALT 26 22  ALKPHOS 87 77  BILITOT 1.7* 2.1*  PROT 6.3* 5.8*  ALBUMIN 2.9* 2.8*   No results for input(s): LIPASE, AMYLASE in the last 168 hours. No results for input(s): AMMONIA in the last 168 hours. CBC:  Recent Labs Lab 11/04/15 0845 11/04/15 1625 11/04/15 1751 11/04/15 2213 11/05/15 0208 11/07/15 1240  WBC 8.5  --  7.5 6.0 6.3 6.6  HGB 10.5* 10.6* 9.7* 9.9* 10.2* 9.6*  HCT 33.7* 33.6* 30.7*  30.3* 33.0* 30.9*  MCV 100.0  --  99.4 98.1 100.0 98.4  PLT 164  --  135* 122* 105* 95*   Cardiac Enzymes:  Recent Labs Lab 11/04/15 2020 11/05/15 0208 11/05/15 0850  TROPONINI <0.03 <0.03 <0.03   BNP: BNP (last 3 results) No results for input(s): BNP in the last 8760 hours.  ProBNP (last 3 results) No results for input(s): PROBNP in the last 8760 hours.  CBG:  Recent Labs Lab 11/04/15 0916 11/04/15 1216 11/05/15 0809 11/06/15 0736 11/07/15 0740  GLUCAP 103* 97 127* 158* 108*       Signed:  Carolyne Littles, MD [facility] [phone] pager

## 2015-11-07 NOTE — Procedures (Signed)
   US guided LLQ paracentesis Deep pocket identified Sterile probe guided paracentesis Gained access one time into collection Obtained 5cc Pt very painful and lost access Unable to regain access after 3 other attempts Stopped procedure  5 cc obtained of yellow fluid sent for labs per MD  BP wnl

## 2015-11-07 NOTE — Progress Notes (Signed)
Progress Note   Subjective  Pt feels some better today Still feels upper abd pressure worse with eating No n/v.  No BM and thus no melena or rectal bleeding Wants to go home Tolerating liquid diet Had attempt at paracentesis but limited fluid and pocket deep.  On 5 cc obtained. Procedure then stopped    Objective   Vital signs in last 24 hours: Temp:  [97.4 F (36.3 C)-98.5 F (36.9 C)] 98.5 F (36.9 C) (04/16 0506) Pulse Rate:  [84-87] 87 (04/16 0506) Resp:  [16-19] 16 (04/16 0506) BP: (108-137)/(56-96) 137/70 mmHg (04/16 0851) SpO2:  [95 %-99 %] 95 % (04/16 0506) Weight:  [176 lb 4.8 oz (79.968 kg)] 176 lb 4.8 oz (79.968 kg) (04/15 1700) Last BM Date: 11/04/15 General:    white female in NAD Heart:  Regular rate and rhythm; no murmurs Lungs: Respirations even and unlabored, lungs CTA bilaterally Abdomen:  Soft, mildly tympanitic without significant distention, NT, +BS Extremities:  Without edema. Neurologic:  Alert and oriented,  grossly normal neurologically, no asterixis Psych:  Cooperative. Normal mood and affect.  Intake/Output from previous day: 04/15 0701 - 04/16 0700 In: 1353.8 [P.O.:360; I.V.:893.8; IV Piggyback:100] Out: -  Intake/Output this shift: Total I/O In: 2145 [I.V.:2145] Out: -   Lab Results:  Recent Labs  11/04/15 1751 11/04/15 2213 11/05/15 0208  WBC 7.5 6.0 6.3  HGB 9.7* 9.9* 10.2*  HCT 30.7* 30.3* 33.0*  PLT 135* 122* 105*   BMET  Recent Labs  11/05/15 0850 11/06/15 0615 11/07/15 0610  NA 139 137 138  K 4.0 3.7 3.5  CL 106 106 105  CO2 22 22 24   GLUCOSE 126* 123* 116*  BUN 10 <5* <5*  CREATININE 0.71 0.59 0.53  CALCIUM 7.9* 7.8* 8.1*   LFT  Recent Labs  11/04/15 1751  PROT 5.8*  ALBUMIN 2.8*  AST 63*  ALT 22  ALKPHOS 77  BILITOT 2.1*   PT/INR  Recent Labs  11/04/15 2020  LABPROT 19.0*  INR 1.59*    Studies/Results: [place] Abdomen Complete  11/06/2015  CLINICAL DATA:  Alcohol abuse.  Right upper  quadrant abdominal pain. EXAM: ABDOMEN ULTRASOUND COMPLETE COMPARISON:  11/05/2015 limited abdominal sonogram. FINDINGS: Gallbladder: No gallstones. Mild diffuse gallbladder wall thickening. No pericholecystic fluid or sonographic Murphy's sign. Common bile duct: Diameter: 3 mm Liver: Liver parenchyma is diffusely markedly heterogeneous in echotexture and liver surface is diffusely mildly irregular, in keeping with cirrhosis. No discrete liver mass is demonstrated. IVC: No abnormality visualized. Pancreas: Visualized portion unremarkable. Spleen: Mild splenomegaly (craniocaudal splenic length 13.4 cm with splenic volume 621 cc). No splenic mass. Right Kidney: Length: 11.7 cm. Echogenicity within normal limits. No mass or hydronephrosis visualized. Left Kidney: Length: 11.5 cm. Echogenicity within normal limits. No mass or hydronephrosis visualized. Abdominal aorta: No aneurysm visualized. Other findings: Small volume perihepatic ascites. IMPRESSION: 1. Cirrhosis. Although no discrete liver mass is detected, liver parenchyma is diffusely markedly heterogeneous and a liver mass cannot be excluded on the basis of this scan. Recommend short-term outpatient evaluation with MRI abdomen without and with IV contrast. 2. Small volume perihepatic ascites. 3. Mild diffuse gallbladder wall thickening, with no cholelithiasis, pericholecystic fluid or sonographic Murphy's sign, favor noninflammatory gallbladder wall edema. 4. Mild splenomegaly. Electronically Signed   By: Delbert PhenixJason A Poff M.D.   On: 11/06/2015 18:38   [place] Abdomen Limited  11/05/2015  CLINICAL DATA:  Abdominal distension, cirrhosis. EXAM: LIMITED ABDOMEN ULTRASOUND FOR ASCITES TECHNIQUE: Limited ultrasound survey for ascites was  performed in all four abdominal quadrants. COMPARISON:  None. FINDINGS: Fluid is seen in all 4 quadrants of the abdomen. IMPRESSION: Ascites. Electronically Signed   By: Leanna Battles M.D.   On: 11/05/2015 20:04   US  Paracentesis  11/07/2015  INDICATION: Abd pain Ascites EXAM: ULTRASOUND-GUIDED PARACENTESIS COMPARISON:  None. MEDICATIONS: 10 cc 1% lidocaine COMPLICATIONS: None immediate. TECHNIQUE: Informed written consent was obtained from the patient after a discussion of the risks, benefits and alternatives to treatment. A timeout was performed prior to the initiation of the procedure. Initial ultrasound scanning demonstrates a small amount of ascites within the left lower abdominal quadrant. The left lower abdomen was prepped and draped in the usual sterile fashion. 1% lidocaine with epinephrine was used for local anesthesia. Under direct ultrasound guidance, a 19 gauge, 7-cm, Yueh catheter was introduced. An ultrasound image was saved for documentation purposed. The paracentesis was performed. Access into fluid was obtained using sterile probe guidance. 5 cc yellow fluid obtained. Pt very painful and moving on table. Access lost. Reattempt at access x 3; never able to regain access; patient painful; stopped procedure. The catheter was removed and a dressing was applied. The patient tolerated the procedure well without immediate post procedural complication. FINDINGS: A total of approximately 5 cc of yellow fluid was removed. Samples were sent to the laboratory as requested by the clinical team. IMPRESSION: Successful ultrasound-guided paracentesis yielding 5 cc of peritoneal fluid. Read by:  Robet Leu [facility] Electronically Signed   By: Gilmer Mor D.O.   On: 11/07/2015 09:04       Assessment / Plan:   50 yo with ETOH cirrhosis, Portal hypertension admitted with variceal hemorrhage status post band ligation. Also with gastric ulcer disease  1. ETOH cirrhosis with portal hypertension and variceal hemorrhage -- no further evidence of variceal hemorrhage. She received empiric antibiotics. Octreotide stop now after 72 hours. We've had long discussion about cirrhosis, portal hypertension and the necessity of alcohol  avoidance. She seems committed to avoid alcohol as does her husband.  She will be discharged today and has been made aware of the need for close GI follow-up. --Begin nadolol 20 mg daily, titrate to resting heart rate of 60 can be done at follow-up.  Repeat EGD recommended in 2-4 weeks for banding protocol --Ascites minimal so will not need diuretics at this point. Low sodium diet recommended --Can stop antibiotics --No alcohol --No evidence of HCC, repeat ultrasound in 6 months --Follow-up with Dr. Adela Lank  2.  Gastric ulcer disease -- avoid NSAIDs and no ETOH. H. pylori antibody versus biopsy at next upper endoscopy these to be performed. Twice a day PPI for at least 8 weeks, daily thereafter.   Active Problems:   Acute GI bleeding   Alcohol use disorder (HCC)   Alcohol abuse   Bleeding esophageal varices (HCC)   UGIB (upper gastrointestinal bleed)   Hypomagnesemia   Ascites   Portal hypertension with esophageal varices (HCC)     LOS: 3 days   Savva Beamer M  11/07/2015, 1:10 PM

## 2015-11-09 ENCOUNTER — Encounter (HOSPITAL_COMMUNITY): Payer: Self-pay | Admitting: Gastroenterology

## 2015-11-15 ENCOUNTER — Other Ambulatory Visit: Payer: Self-pay | Admitting: *Deleted

## 2015-11-15 ENCOUNTER — Telehealth: Payer: Self-pay | Admitting: *Deleted

## 2015-11-15 DIAGNOSIS — I8500 Esophageal varices without bleeding: Secondary | ICD-10-CM

## 2015-11-15 NOTE — Telephone Encounter (Signed)
[facility]. Left a message for patient to call back.

## 2015-11-15 NOTE — Telephone Encounter (Signed)
-----   Message from Ruffin FrederickSteven Paul Armbruster, MD sent at 11/15/2015  1:21 PM EDT ----- Yes please! Thanks   ----- Message -----    From: Daphine Deutscheregina N Jcion Buddenhagen, RN    Sent: 11/15/2015  11:34 AM      To: Ruffin FrederickSteven Paul Armbruster, MD  Dr. Adela LankArmbruster, A patient cancelled on your 11/30/15 hospital schedule. Do you want this patient put in the spot?(EGD with banding) Rene Kocheregina

## 2015-11-15 NOTE — Telephone Encounter (Signed)
Scheduled EGD with band[facility]/30/15 [facility] Noreene Larsson(Jill)

## 2015-11-16 NOTE — Telephone Encounter (Signed)
Left a message for patient to call back. 

## 2015-11-17 ENCOUNTER — Encounter: Payer: Self-pay | Admitting: *Deleted

## 2015-11-17 NOTE — Telephone Encounter (Signed)
Patient given appointment date and time for procedure. Gave her instructions also. Mailed instructions also. She is worried because she does not have insurance at this time. Gave her the [facility] patient assistance with bills number to call.

## 2015-11-24 ENCOUNTER — Encounter (HOSPITAL_COMMUNITY): Payer: Self-pay | Admitting: *Deleted

## 2015-11-29 ENCOUNTER — Encounter: Payer: Self-pay | Admitting: Gastroenterology

## 2015-11-29 NOTE — Telephone Encounter (Signed)
A user error has taken place.

## 2015-11-30 ENCOUNTER — Telehealth: Payer: Self-pay | Admitting: *Deleted

## 2015-11-30 ENCOUNTER — Encounter (HOSPITAL_COMMUNITY)
Admission: RE | Disposition: A | Discharge status: Home / Self Care | Payer: Self-pay | Source: Ambulatory Visit | Attending: Gastroenterology

## 2015-11-30 ENCOUNTER — Ambulatory Visit (HOSPITAL_COMMUNITY)
Admission: RE | Admit: 2015-11-30 | Discharge: 2015-11-30 | Disposition: A | Discharge status: Home / Self Care | Payer: Self-pay | Source: Ambulatory Visit | Attending: Gastroenterology | Admitting: Gastroenterology

## 2015-11-30 ENCOUNTER — Encounter (HOSPITAL_COMMUNITY): Payer: Self-pay | Admitting: Anesthesiology

## 2015-11-30 ENCOUNTER — Ambulatory Visit (HOSPITAL_COMMUNITY): Payer: MEDICAID | Admitting: Anesthesiology

## 2015-11-30 ENCOUNTER — Other Ambulatory Visit: Payer: Self-pay | Admitting: *Deleted

## 2015-11-30 DIAGNOSIS — K7030 Alcoholic cirrhosis of liver without ascites: Secondary | ICD-10-CM

## 2015-11-30 DIAGNOSIS — I8500 Esophageal varices without bleeding: Secondary | ICD-10-CM | POA: Insufficient documentation

## 2015-11-30 DIAGNOSIS — E669 Obesity, unspecified: Secondary | ICD-10-CM | POA: Insufficient documentation

## 2015-11-30 DIAGNOSIS — K2950 Unspecified chronic gastritis without bleeding: Secondary | ICD-10-CM | POA: Insufficient documentation

## 2015-11-30 DIAGNOSIS — K3189 Other diseases of stomach and duodenum: Secondary | ICD-10-CM

## 2015-11-30 DIAGNOSIS — Z6828 Body mass index (BMI) 28.0-28.9, adult: Secondary | ICD-10-CM | POA: Insufficient documentation

## 2015-11-30 HISTORY — PX: ESOPHAGOGASTRODUODENOSCOPY (EGD) WITH PROPOFOL: SHX5813

## 2015-11-30 SURGERY — ESOPHAGOGASTRODUODENOSCOPY (EGD) WITH PROPOFOL
Anesthesia: Monitor Anesthesia Care

## 2015-11-30 MED ORDER — LACTATED RINGERS IV SOLN
INTRAVENOUS | Status: DC | PRN
  Administered 2015-11-30: 10:00:00 via INTRAVENOUS

## 2015-11-30 MED ORDER — BUTAMBEN-TETRACAINE-BENZOCAINE 2-2-14 % EX AERO
INHALATION_SPRAY | CUTANEOUS | Status: DC | PRN
  Administered 2015-11-30: 2 via TOPICAL

## 2015-11-30 MED ORDER — PROPOFOL 10 MG/ML IV BOLUS
INTRAVENOUS | Status: AC
  Filled 2015-11-30: qty 40

## 2015-11-30 MED ORDER — LIDOCAINE HCL (CARDIAC) 20 MG/ML IV SOLN
INTRAVENOUS | Status: DC | PRN
  Administered 2015-11-30: 40 mg via INTRAVENOUS

## 2015-11-30 MED ORDER — ONDANSETRON HCL 4 MG/2ML IJ SOLN
INTRAMUSCULAR | Status: DC | PRN
  Administered 2015-11-30: 4 mg via INTRAVENOUS

## 2015-11-30 MED ORDER — PROPOFOL 10 MG/ML IV BOLUS
INTRAVENOUS | Status: DC | PRN
  Administered 2015-11-30: 40 mg via INTRAVENOUS
  Administered 2015-11-30: 30 mg via INTRAVENOUS
  Administered 2015-11-30: 40 mg via INTRAVENOUS
  Administered 2015-11-30: 20 mg via INTRAVENOUS
  Administered 2015-11-30 (×2): 30 mg via INTRAVENOUS
  Administered 2015-11-30: 10 mg via INTRAVENOUS

## 2015-11-30 SURGICAL SUPPLY — 14 items

## 2015-11-30 NOTE — Anesthesia Preprocedure Evaluation (Addendum)
Anesthesia Evaluation  Patient identified by MRN, date of birth, ID band Patient awake    Reviewed: Allergy & Precautions, NPO status , Patient's Chart, lab work & pertinent test results  Airway Mallampati: II  TM Distance: >3 FB Neck ROM: Full    Dental no notable dental hx. (+) Dental Advisory Given, Missing, Chipped   Pulmonary neg pulmonary ROS,    Pulmonary exam normal breath sounds clear to auscultation       Cardiovascular negative cardio ROS Normal cardiovascular exam Rhythm:Regular Rate:Normal     Neuro/Psych  Headaches, PSYCHIATRIC DISORDERS Anxiety    GI/Hepatic negative GI ROS, (+) Cirrhosis   Esophageal Varices  substance abuse  alcohol use,   Endo/Other  negative endocrine ROS  Renal/GU negative Renal ROS  negative genitourinary   Musculoskeletal negative musculoskeletal ROS (+)   Abdominal (+) + obese,   Peds negative pediatric ROS (+)  Hematology  (+) anemia ,   Anesthesia Other Findings   Reproductive/Obstetrics negative OB ROS                            Anesthesia Physical Anesthesia Plan  ASA: III  Anesthesia Plan: MAC   Post-op Pain Management:    Induction: Intravenous  Airway Management Planned: Natural Airway  Additional Equipment:   Intra-op Plan:   Post-operative Plan:   Informed Consent: I have reviewed the patients History and Physical, chart, labs and discussed the procedure including the risks, benefits and alternatives for the proposed anesthesia with the patient or authorized representative who has indicated his/her understanding and acceptance.   Dental advisory given  Plan Discussed with: CRNA  Anesthesia Plan Comments:         Anesthesia Quick Evaluation

## 2015-11-30 NOTE — Interval H&P Note (Signed)
Patient previously admitted for upper GI bleed, was due to esophageal varices +/- gastric ulcers. She had the varices banded with hemostasis. On PPI now. She is here for further banding of varices for eradication, on nadolol. US liver shows cirrhosis, likely due to alcohol.   The indications, risks, and benefits of EGD were explained to the patient in detail. Risks include but are not limited to bleeding, perforation, adverse reaction to medications, and cardiopulmonary compromise. Sequelae include but are not limited to the possibility of surgery, hospitalization, and mortality. The patient verbalized understanding and wished to proceed.   History and Physical Interval Note:  11/30/2015 9:42 AM  Tina Haley  has presented today for surgery, with the diagnosis of esophageal varices  The various methods of treatment have been discussed with the patient and family. After consideration of risks, benefits and other options for treatment, the patient has consented to  Procedure(s): ESOPHAGOGASTRODUODENOSCOPY (EGD) WITH PROPOFOL (N/A) ESOPHAGEAL BANDING (N/A) as a surgical intervention .  The patient's history has been reviewed, patient examined, no change in status, stable for surgery.  I have reviewed the patient's chart and labs.  Questions were answered to the patient's satisfaction.     Reeves ForthSteven Paul Armbruster

## 2015-11-30 NOTE — Anesthesia Postprocedure Evaluation (Signed)
Anesthesia Post Note  Patient: Minna AntisKathy Caffee  Procedure(s) Performed: Procedure(s) (LRB): ESOPHAGOGASTRODUODENOSCOPY (EGD) WITH PROPOFOL (N/A) ESOPHAGEAL BANDING (N/A)  Patient location during evaluation: PACU Anesthesia Type: MAC Level of consciousness: awake and alert Pain management: pain level controlled Vital Signs Assessment: post-procedure vital signs reviewed and stable Respiratory status: spontaneous breathing, nonlabored ventilation, respiratory function stable and patient connected to nasal cannula oxygen Cardiovascular status: stable and blood pressure returned to baseline Anesthetic complications: no    Last Vitals:  Filed Vitals:   11/30/15 1055 11/30/15 1100  BP:  120/68  Pulse: 65 72  Temp:    Resp: 15 17    Last Pain: There were no vitals filed for this visit.               Mashal Slavick J

## 2015-11-30 NOTE — Transfer of Care (Signed)
Immediate Anesthesia Transfer of Care Note  Patient: Tina Haley  Procedure(s) Performed: Procedure(s): ESOPHAGOGASTRODUODENOSCOPY (EGD) WITH PROPOFOL (N/A) ESOPHAGEAL BANDING (N/A)  Patient Location: PACU and Endoscopy Unit  Anesthesia Type:MAC  Level of Consciousness: awake and alert   Airway & Oxygen Therapy: Patient Spontanous Breathing and Patient connected to nasal cannula oxygen  Post-op Assessment: Report given to RN and Post -op Vital signs reviewed and stable  Post vital signs: Reviewed  Last Vitals:  Filed Vitals:   11/30/15 0945 11/30/15 1028  BP: 114/63   Pulse: 70   Temp:  36.6 C  Resp: 16     Last Pain: There were no vitals filed for this visit.       Complications: No apparent anesthesia complications

## 2015-11-30 NOTE — Op Note (Signed)
[facility] Patient Name: Tina Haley Procedure Date: 11/30/2015 MRN: [id number] Attending MD: Willaim Rayas. Adela Lank , MD Date of Birth: Oct 26, 1965 CSN: [id number] Age: 50 Admit Type: Outpatient Procedure:                Upper GI endoscopy Indications:              Follow-up of esophageal varices - patient with GI                            bleeding last month, actively bleeding esophageal                            varices noted which required banding, patient also                            with multiple gastric antral ulcers at the time. On                            PPI and nadolol, here for surveillance EGD with                            further banding as needed for eradication. Patient                            with cirrhotic liver on imaging. Providers:                Willaim Rayas. Adela Lank, MD, Michel Bickers, RN,                            Kandice Robinsons, Technician Referring MD:              Medicines:                Monitored Anesthesia Care Complications:            No immediate complications. Estimated blood loss:                            Minimal. Estimated Blood Loss:     Estimated blood loss was minimal. Procedure:                Pre-Anesthesia Assessment:                           - Prior to the procedure, a History and Physical                            was performed, and patient medications and                            allergies were reviewed. The patient's tolerance of                            previous anesthesia was also reviewed. The risks  and benefits of the procedure and the sedation                            options and risks were discussed with the patient.                            All questions were answered, and informed consent                            was obtained. Prior Anticoagulants: The patient has                            taken no previous anticoagulant or antiplatelet         agents. ASA Grade Assessment: III - A patient with                            severe systemic disease. After reviewing the risks                            and benefits, the patient was deemed in                            satisfactory condition to undergo the procedure.                           After obtaining informed consent, the endoscope was                            passed under direct vision. Throughout the                            procedure, the patient's blood pressure, pulse, and                            oxygen saturations were monitored continuously. The                            Endoscope was introduced through the mouth, and                            advanced to the second part of duodenum. The upper                            GI endoscopy was accomplished without difficulty.                            The patient tolerated the procedure well. Scope In: Scope Out: Findings:      Esophagogastric landmarks were identified: the Z-line was found at 35       cm, the gastroesophageal junction was found at 35 cm and the upper       extent of the gastric folds was found at 35 cm from the incisors.      Two columns of very mild grade I  varices were found in the lower third       of the esophagus. With insufflation, the varices were completely       flattened and in this light, no further banding was performed. No       stigmata of recent bleeding were evident and no red wale signs were       present. Scarring from prior treatment was visible. The varices appeared       considerably smaller than was appreciated at prior exam.      Patchy mildly erythematous mucosa without bleeding was found in the       gastric antrum. Biopsies were taken with a cold forceps for Helicobacter       pylori testing.      The exam of the stomach was otherwise normal. The previously noted       gastric ulcers have resolved. H pylori testing performed.      The duodenal bulb and second portion  of the duodenum were normal. Impression:               - Esophagogastric landmarks identified.                           - Non-bleeding grade I mild esophageal varices                            which easily flattened with insufflation, prior                            scarring noted, no further banding performed.                           - Erythematous mucosa in the antrum. Biopsied.                            Prior gastric ulcers have resolved.                           - Normal duodenal bulb and second portion of the                            duodenum. Moderate Sedation:      N/A- Per Anesthesia Care Recommendation:           - Patient has a contact number available for                            emergencies. The signs and symptoms of potential                            delayed complications were discussed with the                            patient. Return to normal activities tomorrow.                            Written discharge instructions were provided to the  patient.                           - Resume previous diet.                           - Continue present medications.                           - No aspirin, ibuprofen, naproxen, or other                            non-steroidal anti-inflammatory drugs.                           - Await pathology results.                           - Repeat upper endoscopy in 3 months for recurrence                            of varices per AASLD guidelines, following                            bleeding, as varices now very mild, not amenable to                            banding today                           - Titrate nadolol to  as tolerated, HR goal                            around 60                           - MRI liver with contrast for [facility] screening given                            prior US findings                           - AFP level for [facility] screening                           - Additional labs  for evaluation for chronic liver                            diseases                           - Follow up in the clinic with me for reassessment                            following imaging and labs. Our office will  coordinate these exams / labs, and follow up Procedure Code(s):        --- Professional ---                           [phone], Esophagogastroduodenoscopy, flexible,                            transoral; with biopsy, single or multiple Diagnosis Code(s):        --- Professional ---                           I85.00, Esophageal varices without bleeding                           K31.89, Other diseases of stomach and duodenum CPT copyright 2016 American Medical Association. All rights reserved. The codes documented in this report are preliminary and upon coder review may  be revised to meet current compliance requirements. Viviann Spare P. Corra Kaine, MD 11/30/2015 10:37:25 AM This report has been signed electronically. Number of Addenda: 0

## 2015-11-30 NOTE — Discharge Instructions (Signed)
Esophagogastroduodenoscopy °Esophagogastroduodenoscopy (EGD) is a procedure that is used to examine the lining of the esophagus, stomach, and first part of the small intestine (duodenum). A long, flexible, lighted tube with a camera attached (endoscope) is inserted down the throat to view these organs. This procedure is done to detect problems or abnormalities, such as inflammation, bleeding, ulcers, or growths, in order to treat them. The procedure lasts 5-20 minutes. It is usually an outpatient procedure, but it may need to be performed in a hospital in emergency cases. °LET YOUR HEALTH CARE PROVIDER KNOW ABOUT: °· Any allergies you have. °· All medicines you are taking, including vitamins, herbs, eye drops, creams, and over-the-counter medicines. °· Previous problems you or members of your family have had with the use of anesthetics. °· Any blood disorders you have. °· Previous surgeries you have had. °· Medical conditions you have. °RISKS AND COMPLICATIONS °Generally, this is a safe procedure. However, problems can occur and include: °· Infection. °· Bleeding. °· Tearing (perforation) of the esophagus, stomach, or duodenum. °· Difficulty breathing or not being able to breathe. °· Excessive sweating. °· Spasms of the larynx. °· Slowed heartbeat. °· Low blood pressure. °BEFORE THE PROCEDURE °· Do not eat or drink anything after midnight on the night before the procedure or as directed by your health care provider. °· Do not take your regular medicines before the procedure if your health care provider asks you not to. Ask your health care provider about changing or stopping those medicines. °· If you wear dentures, be prepared to remove them before the procedure. °· Arrange for someone to drive you home after the procedure. °PROCEDURE °· A numbing medicine (local anesthetic) may be sprayed in your throat for comfort and to stop you from gagging or coughing. °· You will have an IV tube inserted in a vein in your  hand or arm. You will receive medicines and fluids through this tube. °· You will be given a medicine to relax you (sedative). °· A pain reliever will be given through the IV tube. °· A mouth guard may be placed in your mouth to protect your teeth and to keep you from biting on the endoscope. °· You will be asked to lie on your left side. °· The endoscope will be inserted down your throat and into your esophagus, stomach, and duodenum. °· Air will be put through the endoscope to allow your health care provider to clearly view the lining of your esophagus. °· The lining of your esophagus, stomach, and duodenum will be examined. During the exam, your health care provider may: °¨ Remove tissue to be examined under a microscope (biopsy) for inflammation, infection, or other medical problems. °¨ Remove growths. °¨ Remove objects (foreign bodies) that are stuck. °¨ Treat any bleeding with medicines or other devices that stop tissues from bleeding (hot cautery, clipping devices). °¨ Widen (dilate) or stretch narrowed areas of your esophagus and stomach. °· The endoscope will be withdrawn. °AFTER THE PROCEDURE °· You will be taken to a recovery area for observation. Your blood pressure, heart rate, breathing rate, and blood oxygen level will be monitored often until the medicines you were given have worn off. °· Do not eat or drink anything until the numbing medicine has worn off and your gag reflex has returned. You may choke. °· Your health care provider should be able to discuss his or her findings with you. It will take longer to discuss the test results if any biopsies were taken. °  °  This information is not intended to replace advice given to you by your health care provider. Make sure you discuss any questions you have with your health care provider. °  °Document Released: 11/10/2004 Document Revised: 07/31/2014 Document Reviewed: 06/12/2012 °Elsevier Interactive Patient Education ©2016 [employer] ° °

## 2015-11-30 NOTE — H&P (View-Only) (Signed)
Tina Haley: 8:48 AM 11/04/2015  LOS: 0 days    Referring Provider: Dr Sherral Hammers  Primary Care Physician:  Cyndy Freeze, MD Primary Gastroenterologist:  Althia Forts. Dr Odie Sera.     Reason for Consultation:  CG emesis   HPI: Tina Haley is a 50 y.o. female.   06/2015 GI bleed with hematemesis from NSAID ulcer. Discharged on q day PPI.  PRBC x 1 for Hgb 7.9.  Chart notes mention possible heavy ETOH use and had fatty liver on ultrasound of 06/2015.   She says she drinks 'socially" at least 1 x per week and is vague as to amounts.  Also was taking PPI, protonix, 1 x per day but ran out about a week ago. No ASA, NSAIDs: using up to 6 or 8 extra strength Tylenol daily for headaches.   Pt had ROV with GI in 07/2015 and EGD set for 09/2015 but Dr Jerilynn Mages is in midst of getting surgery himself so the follow up EGD has been put of for now.   Pt generally has poor appetite.  Infrequent, spontaneous n/v of clears.  No melena or BPR.    Recurrent emesis of Cg material in afternoon 4/12. Subsequent black and tarry emesis, them melenic BM. + syncopal spell (she fell onto a pile of clothes, no trauma sustained.   Pulse 120s but BP 120s/70s.  Hgb 12.9.  BUN 16.  AST/ALT 115/32.  t bili 2.2.  Alk phos 125 (in normal range).  coags normal. ETOH elvated at 0.18  Past Medical History  Diagnosis Date  . GI bleed 06/2015    gastric ulcer per EGD dr Odie Sera  . Anxiety 06/2015    Panic attacks  . Chronic headaches 06/2015  . Fatty liver 06/2015    Past Surgical History  Procedure Laterality Date  . Orif tibia & fibula fractures      Prior to Admission medications   Not on File    Scheduled Meds: . folic acid  1 mg Oral Daily  . multivitamin with minerals  1 tablet Oral Daily  . pantoprazole (PROTONIX) IV   40 mg Intravenous Q12H  . sodium chloride flush  3 mL Intravenous Q12H  . thiamine  100 mg Oral Daily   Or  . thiamine  100 mg Intravenous Daily   Infusions: . sodium chloride 125 mL/hr at 11/04/15 0635   PRN Meds: acetaminophen **OR** acetaminophen, LORazepam **OR** LORazepam, morphine injection, ondansetron **OR** ondansetron (ZOFRAN) IV   Allergies as of 11/04/2015  . (No Known Allergies)    Family History  Problem Relation Age of Onset  . Peptic Ulcer Father   . Diabetes Mother   . Diabetes Maternal Aunt   . Heart disease Maternal Grandfather   . Coronary artery disease Mother   . Peripheral Artery Disease Father     Social History   Social History  . Marital Status: Single    Spouse Name: N/A  . Number of Children: N/A  . Years of Education: N/A   Occupational History  . Not on file.   Social History  Main Topics  . Smoking status: Never Smoker   . Smokeless tobacco: Not on file  . Alcohol Use: 0.0 oz/week    0 Standard drinks or equivalent per week     Comment: Denies daily use, denies binge use  . Drug Use: No  . Sexual Activity: Not on file   Other Topics Concern  . Not on file   Social History Narrative  . No narrative on file    REVIEW OF SYSTEMS: Constitutional:  Weight stable.  No profound fatigue or weakness ENT:  No nose bleeds Pulm:  No SOB or cough CV:  No palpitations, no LE edema.  GU:  No hematuria, no frequency GI:  Per HPI.  Denies hx of liver disease Heme:  No excessive bleeding or bruising   Transfusions:  1 in 06/2015 Neuro:  No headaches, no peripheral tingling or numbness Derm:  No itching, no rash or sores.  Endocrine:  No sweats or chills.  No polyuria or dysuria Immunization:  Not queried Travel:  None beyond local counties in last few months.    PHYSICAL EXAM: Vital signs in last 24 hours: Filed Vitals:   11/04/15 0614 11/04/15 0800  BP: 138/74 113/63  Pulse: 116 107  Temp: 98.4 F (36.9 C) 98.6 F (37 C)    Resp: 14 13   Wt Readings from Last 3 Encounters:  11/04/15 70.8 kg (156 lb 1.4 oz)    General: somewhat pale, comfortable Head:  No trauma  Eyes:  No icterus or pallor Ears:  Not HOH  Nose:  No discharge Mouth:  Clear, moist Neck:  No mass or TMG Lungs:  Clear bil.   No dyspnea or cough Heart: RRR.  No MRG.  S1/S2 present Abdomen:  Soft, NT, ND.  No mass or HSM.  Active BS.   Rectal: deferred   Musc/Skeltl: no joint swelling, tenderness of deformity Extremities:  No CCE  Neurologic:  Oriented x 3.  Alert.  Moves all 4s.  No tremor Skin:  No telangectasia or sores.  No rash   Psych:  Anxious. Cooperative.   Intake/Output from previous day:   Intake/Output this shift:    LAB RESULTS: No results for input(s): WBC, HGB, HCT, PLT in the last 72 hours. BMET No results found for: NA, K, CL, CO2, GLUCOSE, BUN, CREATININE, CALCIUM LFT No results for input(s): PROT, ALBUMIN, AST, ALT, ALKPHOS, BILITOT, BILIDIR, IBILI in the last 72 hours. PT/INR No results found for: INR, PROTIME Hepatitis Panel No results for input(s): HEPBSAG, HCVAB, HEPAIGM, HEPBIGM in the last 72 hours. C-Diff No components found for: CDIFF Lipase  No results found for: LIPASE  Drugs of Abuse  No results found for: LABOPIA, COCAINSCRNUR, LABBENZ, AMPHETMU, THCU, LABBARB   RADIOLOGY STUDIES: No results found.  ENDOSCOPIC STUDIES: Per HPI  IMPRESSION:   *  UGIB in pt with hx NSAID ulcer and GIB in 06/2015.  Claims she ran out of Protonix about 1 week ago.    *  ETOH abuse.  Pt minimizes but elevated ETOH level and SGOT>> SGPT.  Fatty liver on 06/2015 ultrasond.     PLAN:     *  EGD, timing TBD.  PPI IV BID for now   [name]  11/04/2015, 8:48 AM Pager: [phone]

## 2015-11-30 NOTE — Telephone Encounter (Signed)
Per MD, needs MRI liver with contrast for HCC screening d/t prior US findings, IGG, AFP, ANA, SMA, Ceruloplasmin, alpha 1 antitrypsin, TIBC, ferritin, Hep A AB and Hep B surface AB. Labs in EPIC. [facility] at 12:00 PM. NPO 4 hours prior. Scheduled OV on 01/10/16 at 10:00 AM. Left a message for patient to call back.

## 2015-12-01 ENCOUNTER — Encounter: Payer: Self-pay | Admitting: Gastroenterology

## 2015-12-01 NOTE — Telephone Encounter (Signed)
Patient given appointments and instructions. She states it may be several weeks before she can come for labs due to her husband having surgery next week.

## 2015-12-02 ENCOUNTER — Encounter (HOSPITAL_COMMUNITY): Payer: Self-pay | Admitting: Gastroenterology

## 2015-12-08 ENCOUNTER — Ambulatory Visit (HOSPITAL_COMMUNITY)
Admission: RE | Admit: 2015-12-08 | Discharge: 2015-12-08 | Disposition: A | Discharge status: Home / Self Care | Payer: Self-pay | Source: Ambulatory Visit | Attending: Gastroenterology | Admitting: Gastroenterology

## 2015-12-08 DIAGNOSIS — K7030 Alcoholic cirrhosis of liver without ascites: Secondary | ICD-10-CM

## 2016-01-06 ENCOUNTER — Encounter: Payer: Self-pay | Admitting: *Deleted

## 2016-01-10 ENCOUNTER — Ambulatory Visit: Payer: Self-pay | Admitting: Gastroenterology

## 2016-03-01 ENCOUNTER — Ambulatory Visit: Payer: Self-pay | Admitting: Gastroenterology

## 2016-04-27 ENCOUNTER — Ambulatory Visit: Payer: Self-pay | Admitting: Gastroenterology

## 2016-05-05 ENCOUNTER — Encounter: Payer: Self-pay | Admitting: Gastroenterology

## 2017-01-04 ENCOUNTER — Telehealth: Payer: Self-pay | Admitting: Gastroenterology

## 2017-01-05 ENCOUNTER — Other Ambulatory Visit: Payer: Self-pay

## 2017-01-05 DIAGNOSIS — K7030 Alcoholic cirrhosis of liver without ascites: Secondary | ICD-10-CM

## 2017-01-05 MED ORDER — NADOLOL 20 MG PO TABS: 20.0000 mg | ORAL_TABLET | Freq: Every day | ORAL | 0 refills | Status: DC

## 2017-01-05 NOTE — Telephone Encounter (Signed)
Orders for labs and u/s have been made. Nadolol was also refilled. Instructed pharmacy to have pt call our office. I will call pt as well.

## 2017-01-05 NOTE — Telephone Encounter (Signed)
Ok to refill Nadolol?

## 2017-01-05 NOTE — Telephone Encounter (Signed)
Yes you can refill it it but she is very overdue for a clinic folllow up. I am recommending she come back to see me in the office. She is due for basic labs - CBC, CMET, INR, AFP, and a follow up US of her liver [facility]. Thanks

## 2017-01-08 NOTE — Telephone Encounter (Signed)
Left message for pt to return call.

## 2017-01-31 ENCOUNTER — Ambulatory Visit (INDEPENDENT_AMBULATORY_CARE_PROVIDER_SITE_OTHER): Payer: Self-pay | Admitting: Gastroenterology

## 2017-01-31 ENCOUNTER — Encounter (INDEPENDENT_AMBULATORY_CARE_PROVIDER_SITE_OTHER): Payer: Self-pay

## 2017-01-31 ENCOUNTER — Other Ambulatory Visit (INDEPENDENT_AMBULATORY_CARE_PROVIDER_SITE_OTHER): Payer: Self-pay

## 2017-01-31 ENCOUNTER — Other Ambulatory Visit (HOSPITAL_COMMUNITY): Payer: Self-pay

## 2017-01-31 ENCOUNTER — Encounter: Payer: Self-pay | Admitting: Gastroenterology

## 2017-01-31 VITALS — BP 102/50 | HR 85 | Ht 63.0 in | Wt 160.0 lb

## 2017-01-31 DIAGNOSIS — I8500 Esophageal varices without bleeding: Secondary | ICD-10-CM

## 2017-01-31 DIAGNOSIS — K7030 Alcoholic cirrhosis of liver without ascites: Secondary | ICD-10-CM

## 2017-01-31 DIAGNOSIS — K7031 Alcoholic cirrhosis of liver with ascites: Secondary | ICD-10-CM

## 2017-01-31 DIAGNOSIS — R17 Unspecified jaundice: Secondary | ICD-10-CM

## 2017-01-31 LAB — ALBUMIN: Albumin: 3.3 g/dL — ABNORMAL LOW (ref 3.5–5.2)

## 2017-01-31 LAB — CBC WITH DIFFERENTIAL/PLATELET
Basophils Absolute: 0.1 K/uL (ref 0.0–0.1)
Basophils Absolute: 0.1 10*3/uL (ref 0.0–0.1)
Basophils Relative: 1 % (ref 0.0–3.0)
Basophils Relative: 1.3 % (ref 0.0–3.0)
Eosinophils Absolute: 0.1 K/uL (ref 0.0–0.7)
Eosinophils Absolute: 0.1 10*3/uL (ref 0.0–0.7)
Eosinophils Relative: 1.1 % (ref 0.0–5.0)
Eosinophils Relative: 0.7 % (ref 0.0–5.0)
HCT: 32.2 % — ABNORMAL LOW (ref 36.0–46.0)
HCT: 32.7 % — ABNORMAL LOW (ref 36.0–46.0)
Hemoglobin: 11.5 g/dL — ABNORMAL LOW (ref 12.0–15.0)
Hemoglobin: 11.8 g/dL — ABNORMAL LOW (ref 12.0–15.0)
Lymphocytes Relative: 13.6 % (ref 12.0–46.0)
Lymphocytes Relative: 16 % (ref 12.0–46.0)
Lymphs Abs: 1 K/uL (ref 0.7–4.0)
Lymphs Abs: 1.2 10*3/uL (ref 0.7–4.0)
MCHC: 35.8 g/dL (ref 30.0–36.0)
MCHC: 36 g/dL (ref 30.0–36.0)
MCV: 117.5 fl — ABNORMAL HIGH (ref 78.0–100.0)
MCV: 116.9 fl — ABNORMAL HIGH (ref 78.0–100.0)
Monocytes Absolute: 1.3 K/uL — ABNORMAL HIGH (ref 0.1–1.0)
Monocytes Absolute: 1.2 10*3/uL — ABNORMAL HIGH (ref 0.1–1.0)
Monocytes Relative: 17.5 % — ABNORMAL HIGH (ref 3.0–12.0)
Monocytes Relative: 15.5 % — ABNORMAL HIGH (ref 3.0–12.0)
Neutro Abs: 4.8 K/uL (ref 1.4–7.7)
Neutro Abs: 5 10*3/uL (ref 1.4–7.7)
Neutrophils Relative %: 66.8 % (ref 43.0–77.0)
Neutrophils Relative %: 66.5 % (ref 43.0–77.0)
Platelets: 105 K/uL — ABNORMAL LOW (ref 150.0–400.0)
Platelets: 106 10*3/uL — ABNORMAL LOW (ref 150.0–400.0)
RBC: 2.74 Mil/uL — ABNORMAL LOW (ref 3.87–5.11)
RBC: 2.8 Mil/uL — ABNORMAL LOW (ref 3.87–5.11)
RDW: 20.6 % — ABNORMAL HIGH (ref 11.5–15.5)
RDW: 20.3 % — ABNORMAL HIGH (ref 11.5–15.5)
WBC: 7.2 K/uL (ref 4.0–10.5)
WBC: 7.5 10*3/uL (ref 4.0–10.5)

## 2017-01-31 LAB — COMPREHENSIVE METABOLIC PANEL
ALT: 39 U/L — ABNORMAL HIGH (ref 0–35)
AST: 133 U/L — ABNORMAL HIGH (ref 0–37)
Albumin: 3.2 g/dL — ABNORMAL LOW (ref 3.5–5.2)
Alkaline Phosphatase: 93 U/L (ref 39–117)
BUN: 6 mg/dL (ref 6–23)
CO2: 33 mEq/L — ABNORMAL HIGH (ref 19–32)
Calcium: 8.6 mg/dL (ref 8.4–10.5)
Chloride: 86 mEq/L — ABNORMAL LOW (ref 96–112)
Creatinine, Ser: 0.7 mg/dL (ref 0.40–1.20)
GFR: 93.55 mL/min (ref >60.00)
Glucose, Bld: 128 mg/dL — ABNORMAL HIGH (ref 70–99)
Potassium: 3.3 mEq/L — ABNORMAL LOW (ref 3.5–5.1)
Sodium: 129 mEq/L — ABNORMAL LOW (ref 135–145)
Total Bilirubin: 10.6 mg/dL — ABNORMAL HIGH (ref 0.2–1.2)
Total Protein: 7.3 g/dL (ref 6.0–8.3)

## 2017-01-31 LAB — IRON AND TIBC
Iron: 124 ug/dL (ref 45–160)
UIBC: <10 ug/dL

## 2017-01-31 LAB — IGA: IgA: 1104 mg/dL — ABNORMAL HIGH (ref 68–378)

## 2017-01-31 LAB — PROTIME-INR
INR: 2.1 ratio — ABNORMAL HIGH (ref 0.8–1.0)
Prothrombin Time: 23 s — ABNORMAL HIGH (ref 9.6–13.1)

## 2017-01-31 LAB — FERRITIN: Ferritin: 1183.8 ng/mL — ABNORMAL HIGH (ref 10.0–291.0)

## 2017-01-31 MED ORDER — FUROSEMIDE 40 MG PO TABS: 40.0000 mg | ORAL_TABLET | Freq: Every day | ORAL | 1 refills | Status: DC

## 2017-01-31 MED ORDER — SPIRONOLACTONE 100 MG PO TABS: 100.0000 mg | ORAL_TABLET | Freq: Every day | ORAL | 1 refills | Status: DC

## 2017-01-31 NOTE — Progress Notes (Signed)
HPI :  51 y/o female here for a follow up visit for cirrhosis.  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 History of variceal bleeding in April 2017. History of ascites.  She has followed with Dr. Braulio Conte in [place] as her primary GI historically.  She has not followed up with me since last EGD in May 2017, nor did she follow up with her primary GI. I had recommended labs for chronic liver diseases which she did not have completed. A liver MRI was ordered last year and likewise was not completed after her [place] read per radiology. She reports her husband was ill and she did not follow up due to taking care of him.   She states she was anemic last summer and took an iron supplement (no records of this)  She felt well at baseline up until May at which point she devloped some tightness in her abdomen.  She has abdominal distension which has been worsening. She is breathing okay and able to sleep. She thinks this has been ongoing on for 6 weeks, she thinks getting slowly worse over time. She also endorses LE edema which is worsening over time. She had some lasix she took on her own previously which helped her but she ran out.. She does not realize she has jaundice - eyes yellow in clinic today. She reports thinking clearly, memory okay. She is working as a Engineer, structural for hospice patients. She has not drank any alcohol for over a year.   She is taking nadolol 20mg  q daily - we had increased it to 40mg  but she can out of it, it was refilled to 20mg . She is taking it. She is also taking protonix 40mg  once daily. She is not having any changes in her bowels. No prior colonoscopy. She denies any fevers or nightsweats. No abdominal pain, mostly just tightness  No history of liver disease noted.  EGD 11/04/2015 - bleeding esophageal varices, gastric ulcers EGD 11/30/2015 - small varices, scarred down, no banding done, healing of ulcers, biopsies negative for H pylori   Past Medical History:    Diagnosis Date  . Alcohol abuse 06/2015  . Anemia 06/2015   due to ulcer bleeding, transfused 1 PRBC.    Marland Kitchen Anxiety 06/2015   Panic attacks  . Chronic gastritis   . Chronic headaches 06/2015  . Cirrhosis (HCC)   . Esophageal varices (HCC)   . Fatty liver 06/2015  . GI bleed 06/2015, 10/2015   gastric ulcer per EGD dr Braulio Conte in [place]  . IDA (iron deficiency anemia)   . Splenomegaly      Past Surgical History:  Procedure Laterality Date  . ESOPHAGOGASTRODUODENOSCOPY N/A 11/04/2015   Procedure: ESOPHAGOGASTRODUODENOSCOPY (EGD);  Surgeon: Ruffin Frederick, MD;  Location: [facility] ENDOSCOPY;  Service: Gastroenterology;  Laterality: N/A;  . ESOPHAGOGASTRODUODENOSCOPY (EGD) WITH PROPOFOL N/A 11/30/2015   Procedure: ESOPHAGOGASTRODUODENOSCOPY (EGD) WITH PROPOFOL;  Surgeon: Ruffin Frederick, MD;  Location: [facility] ENDOSCOPY;  Service: Gastroenterology;  Laterality: N/A;  . ORIF TIBIA & FIBULA FRACTURES     Family History  Problem Relation Age of Onset  . Peptic Ulcer Father   . Peripheral Artery Disease Father   . Diabetes Mother   . Coronary artery disease Mother   . Heart disease Mother   . Diabetes Maternal Aunt   . Heart disease Maternal Grandfather   . Stomach cancer Neg Hx   . Colon cancer Neg Hx   . Rectal cancer Neg Hx   .  Pancreatic cancer Neg Hx    Social History  Substance Use Topics  . Smoking status: Never Smoker  . Smokeless tobacco: Never Used  . Alcohol use No     Comment: as of November 03, 2015-has had no alcohol to drink   Current Outpatient Prescriptions  Medication Sig Dispense Refill  . acetaminophen (TYLENOL) 500 MG tablet Take 1,000 mg by mouth every 6 (six) hours as needed (For pain.).    Marland Kitchen. nadolol (CORGARD) 20 MG tablet Take 1 tablet (20 mg total) by mouth daily. 30 tablet 0  . OVER THE COUNTER MEDICATION Apply 1 application topically daily as needed (For bruising and scars.). Vitamin E Cream    . pantoprazole (PROTONIX) 40 MG tablet Take 1  tablet (40 mg total) by mouth 2 (two) times daily. 60 tablet 0  . Prenatal Vit-Fe Fumarate-FA (PRENATAL MULTIVITAMIN) TABS tablet Take 1 tablet by mouth daily.     . Scar Treatment Products (SCAR GEL EX) Apply 1 application topically daily as needed (For scars.).    Marland Kitchen. furosemide (LASIX) 40 MG tablet Take 1 tablet (40 mg total) by mouth daily. 30 tablet 1  . spironolactone (ALDACTONE) 100 MG tablet Take 1 tablet (100 mg total) by mouth daily. 30 tablet 1   No current facility-administered medications for this visit.    No Known Allergies   Review of Systems: All systems reviewed and negative except where noted in HPI.   Lab Results  Component Value Date   WBC 7.5 01/31/2017   HGB 11.8 (L) 01/31/2017   HCT 32.7 (L) 01/31/2017   MCV 116.9 (H) 01/31/2017   PLT 106.0 (L) 01/31/2017    Lab Results  Component Value Date   CREATININE 0.70 01/31/2017   BUN 6 01/31/2017   NA 129 (L) 01/31/2017   K 3.3 (L) 01/31/2017   CL 86 (L) 01/31/2017   CO2 33 (H) 01/31/2017    Lab Results  Component Value Date   ALT 39 (H) 01/31/2017   AST 133 (H) 01/31/2017   ALKPHOS 93 01/31/2017   BILITOT 10.6 (H) 01/31/2017   Lab Results  Component Value Date   INR 2.1 (H) 01/31/2017   INR 1.59 (H) 11/04/2015   INR 1.76 (H) 11/04/2015     Physical Exam: BP (!) 102/50   Pulse 85   Ht 5\' 3"  (1.6 m)   Wt 160 lb (72.6 kg)   BMI 28.34 kg/m  Constitutional: Pleasant, jaundiced,female in no acute distress. HEENT: Normocephalic and atraumatic.  (+) scleral icterus. Neck supple.  Cardiovascular: Normal rate, regular rhythm.  Pulmonary/chest: Effort normal and breath sounds normal. No wheezing, rales or rhonchi. Abdominal: distended with tight ascites, nontender. There are no masses palpable. No hepatomegaly. Extremities: (+) 2 LE edema Lymphadenopathy: No cervical adenopathy noted. Neurological: Alert and oriented to person place and time. No aseterixis Skin: Skin is warm and dry. No rashes  noted. Psychiatric: Normal mood and affect. Behavior is normal.   ASSESSMENT AND PLAN: 51 year old female with a history of cirrhosis thought most likely to be related to alcohol, complicated by history of bleeding esophageal varices status post banding last year as well as ascites. She did not follow-up as recommended previously, we have not been able to complete her workup to rule out other chronic liver diseases. Since that time she has become decompensated further, significant ascites on exam today, with jaundice, coagulopathy. MELD is calculated to be 24.   I suspect her jaundice is due to decompensated cirrhosis however she needs  imaging of her liver as soon as possible to ensure no obstruction, rule out HCC. She also needs a paracentesis as soon as possible rule out SBP, and perform large volume paracentesis for relief of her symptoms. I have coordinated this to be done as soon as possible in the next 48 hours. If she cannot wait that long and feels progressively worse she will need to be admitted to the hospital.  Recommendations at this time are as follows: - large-volume paracentesis within the next 48 hours - rule out SBP, albumin will be given if greater than 5 L removed. She has no pain or fevers concerning for SBP at this time otherwise and ascites has been progressive over 6 weeks - RUQ [place] ASAP - rule out obstruction and obvious mass lesion, done in the acute setting of hyperbilirubinemia - MRI liver for more formal HCC screening given prior read of remote [place] (in the next few weeks) - AFP level and labs for chronic liver diseases, vaccinate to hep A and B if needed - will stop nadolol for now given severe ascites and LE edema. She will need a surveillance EGD for assessment of varices once she is more improved as outpatient. Can consider resuming nadolol once ascites / edema is controlled - start lasix 40mg  once daily and aldactone 100mg  daily, titrate as needed if renal function  allows. Repeat CMET next week - low Na diet (<2gm / day) - needs screening colonoscopy once more stable - pending her course, if she does not improve, with management, given MELD will need referral for Hepatology / transplant evaluation - continue PPI for now  Patient agreed with the plan. If she worsens in the interim until paracentesis is done, she will need to go to the hospital for more urgent care. All questions answered.  Ileene Patrick, MD [facility] Gastroenterology Pager [phone]

## 2017-01-31 NOTE — Patient Instructions (Addendum)
If you are age 52 or older, your body mass index should be between 23-30. Your Body mass index is 28.34 kg/m. If this is out of the aforementioned range listed, please consider follow up with your Primary Care Provider.  If you are age 52 or younger, your body mass index should be between 19-25. Your Body mass index is 28.34 kg/m. If this is out of the aformentioned range listed, please consider follow up with your Primary Care Provider.   Your physician has requested that you go to the basement for the lab work before leaving today.  You have been scheduled for an MRI at [facility] on Monday, July 16th. Your appointment time is 4:00pm. Please arrive 15 minutes prior to your appointment time for registration purposes. Please make certain not to have anything to eat or drink 4 hours prior to your test. In addition, if you have any metal in your body, have a pacemaker or defibrillator, please be sure to let your ordering physician know. This test typically takes 45 minutes to 1 hour to complete. Should you need to reschedule, please call [phone] to do so.  You have been schedule for a paracentesis at [facility] on Friday, July 13th at 1:00pm. You will need to arrive 15 minutes early for this appointment. There are no restrictions for this procedure.  We have sent the following medications to your pharmacy for you to pick up at your convenience:  Lasix  Aldactone  Please discontinue Nadolol.  You have been given a handout on a low sodium diet to follow.

## 2017-02-01 ENCOUNTER — Ambulatory Visit (HOSPITAL_COMMUNITY)
Admission: RE | Admit: 2017-02-01 | Discharge: 2017-02-01 | Disposition: A | Discharge status: Home / Self Care | Payer: Self-pay | Source: Ambulatory Visit | Attending: Gastroenterology | Admitting: Gastroenterology

## 2017-02-01 ENCOUNTER — Encounter (HOSPITAL_COMMUNITY): Payer: Self-pay | Admitting: Radiology

## 2017-02-01 ENCOUNTER — Other Ambulatory Visit: Payer: Self-pay

## 2017-02-01 DIAGNOSIS — K7031 Alcoholic cirrhosis of liver with ascites: Secondary | ICD-10-CM | POA: Insufficient documentation

## 2017-02-01 DIAGNOSIS — R17 Unspecified jaundice: Secondary | ICD-10-CM | POA: Insufficient documentation

## 2017-02-01 DIAGNOSIS — R188 Other ascites: Secondary | ICD-10-CM | POA: Insufficient documentation

## 2017-02-01 DIAGNOSIS — K7460 Unspecified cirrhosis of liver: Secondary | ICD-10-CM | POA: Insufficient documentation

## 2017-02-01 HISTORY — PX: IR PARACENTESIS: IMG2679

## 2017-02-01 LAB — BODY FLUID CELL COUNT WITH DIFFERENTIAL
Lymphs, Fluid: 26 %
Monocyte-Macrophage-Serous Fluid: 73 % (ref 50–90)
Neutrophil Count, Fluid: 1 % (ref 0–25)
Total Nucleated Cell Count, Fluid: 60 cu mm (ref 0–1000)

## 2017-02-01 LAB — GRAM STAIN

## 2017-02-01 LAB — ANA: Anti Nuclear Antibody(ANA): NEGATIVE

## 2017-02-01 LAB — IGG: IgG (Immunoglobin G), Serum: 1802 mg/dL — ABNORMAL HIGH (ref 694–1618)

## 2017-02-01 LAB — ALBUMIN, PLEURAL OR PERITONEAL FLUID: Albumin, Fluid: <1 g/dL

## 2017-02-01 LAB — AFP TUMOR MARKER: AFP-Tumor Marker: 4.6 ng/mL (ref <6.1)

## 2017-02-01 MED ORDER — LIDOCAINE HCL (PF) 1 % IJ SOLN
INTRAMUSCULAR | Status: DC | PRN
  Administered 2017-02-01: 10 mL

## 2017-02-01 MED ORDER — LIDOCAINE HCL (PF) 1 % IJ SOLN
INTRAMUSCULAR | Status: AC
Start: 2017-02-01 — End: 2017-02-01
  Filled 2017-02-01: qty 30

## 2017-02-01 NOTE — Procedures (Signed)
PROCEDURE SUMMARY:  Successful US guided paracentesis from RLQ.  Yielded 4.2 L of clear yeloow fluid.  No immediate complications.  Pt tolerated well.   Specimen was sent for labs.  Brayton ElBRUNING, Daenerys Buttram PA-C 02/01/2017 1:19 PM

## 2017-02-02 ENCOUNTER — Ambulatory Visit (HOSPITAL_COMMUNITY): Admission: RE | Admit: 2017-02-02 | Payer: Self-pay | Source: Ambulatory Visit

## 2017-02-02 ENCOUNTER — Telehealth: Payer: Self-pay | Admitting: Gastroenterology

## 2017-02-02 LAB — PATHOLOGIST SMEAR REVIEW

## 2017-02-02 LAB — ANTI-SMOOTH MUSCLE ANTIBODY, IGG: Smooth Muscle Ab: 20 U — ABNORMAL HIGH (ref <20)

## 2017-02-02 LAB — ALPHA-1-ANTITRYPSIN: A-1 Antitrypsin, Ser: 224 mg/dL — ABNORMAL HIGH (ref 83–199)

## 2017-02-02 LAB — CERULOPLASMIN: Ceruloplasmin: 20 mg/dL (ref 18–53)

## 2017-02-02 NOTE — Telephone Encounter (Signed)
She just had a paracentesis of 5L, if her extremity edema is getting better her abdomen should slowly get better as well. She has labs to be done on Monday and if stable renal function we may increase her diuretics (she just started them a few days ago). Will otherwise await MRI results. Thanks

## 2017-02-02 NOTE — Telephone Encounter (Signed)
Pt called and wanted to let Dr. Adela LankArmbruster know that she had 5 liters of fluid drained off yesterday, states she was told they could not get all of the fluid off. Reports the fluid pills have started to help with her legs but she thinks her belly may be gathering fluid again. Pt having MRI done on Monday. Dr. Adela LankArmbruster notified.

## 2017-02-05 ENCOUNTER — Encounter (HOSPITAL_COMMUNITY): Payer: Self-pay | Admitting: *Deleted

## 2017-02-05 ENCOUNTER — Other Ambulatory Visit: Payer: Self-pay

## 2017-02-05 ENCOUNTER — Ambulatory Visit (HOSPITAL_COMMUNITY)
Admission: RE | Admit: 2017-02-05 | Discharge: 2017-02-05 | Disposition: A | Discharge status: Home / Self Care | Payer: Self-pay | Source: Ambulatory Visit | Attending: Gastroenterology | Admitting: Gastroenterology

## 2017-02-05 ENCOUNTER — Telehealth: Payer: Self-pay

## 2017-02-05 ENCOUNTER — Other Ambulatory Visit (INDEPENDENT_AMBULATORY_CARE_PROVIDER_SITE_OTHER): Payer: Self-pay

## 2017-02-05 ENCOUNTER — Emergency Department (HOSPITAL_COMMUNITY)
Admission: EM | Admit: 2017-02-05 | Discharge: 2017-02-05 | Disposition: A | Discharge status: Home / Self Care | Payer: Self-pay | Attending: Physician Assistant | Admitting: Physician Assistant

## 2017-02-05 DIAGNOSIS — I864 Gastric varices: Secondary | ICD-10-CM | POA: Insufficient documentation

## 2017-02-05 DIAGNOSIS — R799 Abnormal finding of blood chemistry, unspecified: Secondary | ICD-10-CM | POA: Insufficient documentation

## 2017-02-05 DIAGNOSIS — K7030 Alcoholic cirrhosis of liver without ascites: Secondary | ICD-10-CM

## 2017-02-05 DIAGNOSIS — D649 Anemia, unspecified: Secondary | ICD-10-CM | POA: Insufficient documentation

## 2017-02-05 DIAGNOSIS — K7031 Alcoholic cirrhosis of liver with ascites: Secondary | ICD-10-CM | POA: Insufficient documentation

## 2017-02-05 DIAGNOSIS — K769 Liver disease, unspecified: Secondary | ICD-10-CM

## 2017-02-05 DIAGNOSIS — Z79899 Other long term (current) drug therapy: Secondary | ICD-10-CM | POA: Insufficient documentation

## 2017-02-05 DIAGNOSIS — E876 Hypokalemia: Secondary | ICD-10-CM | POA: Insufficient documentation

## 2017-02-05 LAB — COMPREHENSIVE METABOLIC PANEL
ALT: 46 U/L — ABNORMAL HIGH (ref 0–35)
ALT: 52 U/L (ref 14–54)
AST: 163 U/L — ABNORMAL HIGH (ref 0–37)
AST: 183 U/L — ABNORMAL HIGH (ref 15–41)
Albumin: 3.1 g/dL — ABNORMAL LOW (ref 3.5–5.2)
Albumin: 2.8 g/dL — ABNORMAL LOW (ref 3.5–5.0)
Alkaline Phosphatase: 81 U/L (ref 39–117)
Alkaline Phosphatase: 95 U/L (ref 38–126)
Anion gap: 14 (ref 5–15)
BUN: 6 mg/dL (ref 6–23)
BUN: 5 mg/dL — ABNORMAL LOW (ref 6–20)
CO2: 34 mEq/L — ABNORMAL HIGH (ref 19–32)
CO2: 31 mmol/L (ref 22–32)
Calcium: 8.4 mg/dL (ref 8.4–10.5)
Calcium: 8.3 mg/dL — ABNORMAL LOW (ref 8.9–10.3)
Chloride: 83 mEq/L — ABNORMAL LOW (ref 96–112)
Chloride: 85 mmol/L — ABNORMAL LOW (ref 101–111)
Creatinine, Ser: 0.66 mg/dL (ref 0.40–1.20)
Creatinine, Ser: 0.81 mg/dL (ref 0.44–1.00)
GFR calc Af Amer: >60 mL/min (ref >60)
GFR calc non Af Amer: >60 mL/min (ref >60)
GFR: 100.12 mL/min (ref >60.00)
Glucose, Bld: 120 mg/dL — ABNORMAL HIGH (ref 70–99)
Glucose, Bld: 111 mg/dL — ABNORMAL HIGH (ref 65–99)
Potassium: 2.6 mEq/L — CL (ref 3.5–5.1)
Potassium: 2.6 mmol/L — CL (ref 3.5–5.1)
Sodium: 129 mEq/L — ABNORMAL LOW (ref 135–145)
Sodium: 130 mmol/L — ABNORMAL LOW (ref 135–145)
Total Bilirubin: 10.4 mg/dL — ABNORMAL HIGH (ref 0.2–1.2)
Total Bilirubin: 11.5 mg/dL — ABNORMAL HIGH (ref 0.3–1.2)
Total Protein: 7.3 g/dL (ref 6.0–8.3)
Total Protein: 7.9 g/dL (ref 6.5–8.1)

## 2017-02-05 LAB — CBC WITH DIFFERENTIAL/PLATELET
Basophils Absolute: 0.1 10*3/uL (ref 0.0–0.1)
Basophils Absolute: 0.1 10*3/uL (ref 0.0–0.1)
Basophils Relative: 1 % (ref 0.0–3.0)
Basophils Relative: 1 %
Eosinophils Absolute: 0 10*3/uL (ref 0.0–0.7)
Eosinophils Absolute: 0.1 10*3/uL (ref 0.0–0.7)
Eosinophils Relative: 0.5 % (ref 0.0–5.0)
Eosinophils Relative: 1 %
HCT: 32.9 % — ABNORMAL LOW (ref 36.0–46.0)
HCT: 34.4 % — ABNORMAL LOW (ref 36.0–46.0)
Hemoglobin: 11.7 g/dL — ABNORMAL LOW (ref 12.0–15.0)
Hemoglobin: 12.4 g/dL (ref 12.0–15.0)
Lymphocytes Relative: 15.3 % (ref 12.0–46.0)
Lymphocytes Relative: 18 %
Lymphs Abs: 1.4 10*3/uL (ref 0.7–4.0)
Lymphs Abs: 1.5 10*3/uL (ref 0.7–4.0)
MCH: 40 pg — ABNORMAL HIGH (ref 26.0–34.0)
MCHC: 35.5 g/dL (ref 30.0–36.0)
MCHC: 36 g/dL (ref 30.0–36.0)
MCV: 116.8 fl — ABNORMAL HIGH (ref 78.0–100.0)
MCV: 111 fL — ABNORMAL HIGH (ref 78.0–100.0)
Monocytes Absolute: 1.3 10*3/uL — ABNORMAL HIGH (ref 0.1–1.0)
Monocytes Absolute: 1.4 10*3/uL — ABNORMAL HIGH (ref 0.1–1.0)
Monocytes Relative: 13.8 % — ABNORMAL HIGH (ref 3.0–12.0)
Monocytes Relative: 17 %
Neutro Abs: 6.4 10*3/uL (ref 1.4–7.7)
Neutro Abs: 5.1 10*3/uL (ref 1.7–7.7)
Neutrophils Relative %: 69.4 % (ref 43.0–77.0)
Neutrophils Relative %: 63 %
Platelets: 129 10*3/uL — ABNORMAL LOW (ref 150.0–400.0)
Platelets: 109 10*3/uL — ABNORMAL LOW (ref 150–400)
RBC: 2.82 Mil/uL — ABNORMAL LOW (ref 3.87–5.11)
RBC: 3.1 MIL/uL — ABNORMAL LOW (ref 3.87–5.11)
RDW: 19.9 % — ABNORMAL HIGH (ref 11.5–15.5)
RDW: 19.7 % — ABNORMAL HIGH (ref 11.5–15.5)
WBC: 9.2 10*3/uL (ref 4.0–10.5)
WBC: 8.2 10*3/uL (ref 4.0–10.5)

## 2017-02-05 LAB — MAGNESIUM: Magnesium: 1.1 mg/dL — ABNORMAL LOW (ref 1.7–2.4)

## 2017-02-05 LAB — PHOSPHORUS: Phosphorus: 2.8 mg/dL (ref 2.5–4.6)

## 2017-02-05 MED ORDER — POTASSIUM CHLORIDE CRYS ER 20 MEQ PO TBCR: 20.0000 meq | EXTENDED_RELEASE_TABLET | Freq: Two times a day (BID) | ORAL | 0 refills | Status: DC

## 2017-02-05 MED ORDER — MAGNESIUM SULFATE IN D5W 1-5 GM/100ML-% IV SOLN
1.0000 g | Freq: Once | INTRAVENOUS | Status: DC
Start: 2017-02-05 — End: 2017-02-05

## 2017-02-05 MED ORDER — POTASSIUM CHLORIDE 10 MEQ/100ML IV SOLN
10.0000 meq | Freq: Once | INTRAVENOUS | Status: AC
  Administered 2017-02-05: 10 meq via INTRAVENOUS
  Filled 2017-02-05: qty 100

## 2017-02-05 MED ORDER — SODIUM CHLORIDE 0.9 % IV BOLUS (SEPSIS)
500.0000 mL | Freq: Once | INTRAVENOUS | Status: AC
  Administered 2017-02-05: 500 mL via INTRAVENOUS

## 2017-02-05 MED ORDER — GADOBENATE DIMEGLUMINE 529 MG/ML IV SOLN
14.0000 mL | Freq: Once | INTRAVENOUS | Status: AC | PRN
  Administered 2017-02-05: 14 mL via INTRAVENOUS

## 2017-02-05 MED ORDER — POTASSIUM CHLORIDE CRYS ER 20 MEQ PO TBCR
40.0000 meq | EXTENDED_RELEASE_TABLET | Freq: Once | ORAL | Status: AC
  Administered 2017-02-05: 40 meq via ORAL
  Filled 2017-02-05: qty 2

## 2017-02-05 NOTE — Telephone Encounter (Signed)
K is critically low - likely due to lasix which was recently started for her edema / ascites. Unfortunately I think she needs to go to the ER to have it repeated along with Mg level and have it repleted, given it is so low she is at risk for symptoms / arhythmias due to hypokalemia. She needs to stop lasix and will likely manage her ascites with aldactone monotherapy or much lower dose of lasix. She should have only been on 40mg  dosing of lasix, hopefully she was not taking more than recommended. Can you please call her and let her know? Thanks

## 2017-02-05 NOTE — Discharge Instructions (Signed)
you will need your potassium to be rechecked in 2-3 days.  Please stop lasix until you follow up with your PCP/GI physician.

## 2017-02-05 NOTE — ED Notes (Signed)
ED Provider at bedside. 

## 2017-02-05 NOTE — ED Provider Notes (Signed)
[facility]-EMERGENCY DEPT Provider Note   CSN: [id number] Arrival date & time: 02/05/17  1754     History   Chief Complaint Chief Complaint  Patient presents with  . Abnormal Lab    HPI Tina Haley is a 51 y.o. female.  HPI   Pt si a 51 yo with ho alcohol abuse, varices, presenting today with low K.  Pt had paracentesis last week for increasing abdominal girth. Had labs drawn today, was being sent for an MRI of her liver by GI.  Labs came back showing low potassium. She was sent to the emergency department from MRI before getting an MRI. Patient has no symptoms.  Past Medical History:  Diagnosis Date  . Alcohol abuse 06/2015  . Anemia 06/2015   due to ulcer bleeding, transfused 1 PRBC.    Marland Kitchen. Anxiety 06/2015   Panic attacks  . Chronic gastritis   . Chronic headaches 06/2015  . Cirrhosis (HCC)   . Esophageal varices (HCC)   . Fatty liver 06/2015  . GI bleed 06/2015, 10/2015   gastric ulcer per EGD dr Braulio ConteMeisenheimer in [place]  . IDA (iron deficiency anemia)   . Splenomegaly     Patient Active Problem List   Diagnosis Date Noted  . Esophageal varices without bleeding (HCC)   . Cirrhosis, alcoholic (HCC)   . Ascites   . Portal hypertension with esophageal varices (HCC)   . Acute GI bleeding 11/04/2015  . Alcohol use disorder (HCC) 11/04/2015  . UGIB (upper gastrointestinal bleed) 11/04/2015  . Alcohol abuse   . Bleeding esophageal varices (HCC)   . Hypomagnesemia     Past Surgical History:  Procedure Laterality Date  . ESOPHAGOGASTRODUODENOSCOPY N/A 11/04/2015   Procedure: ESOPHAGOGASTRODUODENOSCOPY (EGD);  Surgeon: Ruffin FrederickSteven Paul Armbruster, MD;  [facility];  Service: Gastroenterology;  Laterality: N/A;  . ESOPHAGOGASTRODUODENOSCOPY (EGD) WITH PROPOFOL N/A 11/30/2015   Procedure: ESOPHAGOGASTRODUODENOSCOPY (EGD) WITH PROPOFOL;  Surgeon: Ruffin FrederickSteven Paul Armbruster, MD;  Location: [facility] ENDOSCOPY;  Service: Gastroenterology;  Laterality: N/A;  . IR PARACENTESIS   02/01/2017  . ORIF TIBIA & FIBULA FRACTURES      OB History    No data available       Home Medications    Prior to Admission medications   Medication Sig Start Date End Date Taking? Authorizing Provider  furosemide (LASIX) 40 MG tablet Take 1 tablet (40 mg total) by mouth daily. 01/31/17  Yes Armbruster, Reeves ForthSteven Paul, MD  pantoprazole (PROTONIX) 40 MG tablet Take 1 tablet (40 mg total) by mouth 2 (two) times daily. 11/07/15  Yes Drema DallasWoods, Curtis J, MD  Prenatal Vit-Fe Fumarate-FA (PRENATAL MULTIVITAMIN) TABS tablet Take 1 tablet by mouth daily.    Yes [provider]  spironolactone (ALDACTONE) 100 MG tablet Take 1 tablet (100 mg total) by mouth daily. 01/31/17  Yes Armbruster, Reeves ForthSteven Paul, MD  nadolol (CORGARD) 20 MG tablet Take 1 tablet (20 mg total) by mouth daily. Patient not taking: Reported on 02/05/2017 01/05/17   Armbruster, Reeves ForthSteven Paul, MD    Family History Family History  Problem Relation Age of Onset  . Peptic Ulcer Father   . Peripheral Artery Disease Father   . Diabetes Mother   . Coronary artery disease Mother   . Heart disease Mother   . Diabetes Maternal Aunt   . Heart disease Maternal Grandfather   . Stomach cancer Neg Hx   . Colon cancer Neg Hx   . Rectal cancer Neg Hx   . Pancreatic cancer Neg Hx  Social History Social History  Substance Use Topics  . Smoking status: Never Smoker  . Smokeless tobacco: Never Used  . Alcohol use No     Comment: as of November 03, 2015-has had no alcohol to drink     Allergies   Patient has no known allergies.   Review of Systems Review of Systems  Constitutional: Negative for activity change.  Respiratory: Negative for shortness of breath.   Cardiovascular: Negative for chest pain.  Gastrointestinal: Negative for abdominal pain.     Physical Exam Updated Vital Signs BP 126/71   Pulse (!) 109   Temp 97.9 F (36.6 C) (Oral)   Resp 18   SpO2 99%   Physical Exam  Constitutional: She is oriented to  person, place, and time. She appears well-developed and well-nourished.  HENT:  Head: Normocephalic and atraumatic.  Icterus sclera, bilateral  Eyes: Right eye exhibits no discharge.  Cardiovascular: Normal rate, regular rhythm and normal heart sounds.   No murmur heard. Pulmonary/Chest: Effort normal and breath sounds normal. She has no wheezes. She has no rales.  Abdominal: Soft. She exhibits distension. There is no tenderness.  Neurological: She is oriented to person, place, and time.  Skin: Skin is warm and dry. She is not diaphoretic.  Psychiatric: She has a normal mood and affect.  Nursing note and vitals reviewed.    ED Treatments / Results  Labs (all labs ordered are listed, but only abnormal results are displayed) Labs Reviewed  CBC WITH DIFFERENTIAL/PLATELET  COMPREHENSIVE METABOLIC PANEL  MAGNESIUM  PHOSPHORUS    EKG  EKG Interpretation None       Radiology No results found.  Procedures Procedures (including critical care time)  Medications Ordered in ED Medications  potassium chloride 10 mEq in 100 mL IVPB (not administered)  sodium chloride 0.9 % bolus 500 mL (not administered)  potassium chloride SA (K-DUR,KLOR-CON) CR tablet 40 mEq (40 mEq Oral Given 02/05/17 2003)     Initial Impression / Assessment and Plan / ED Course  I have reviewed the triage vital signs and the nursing notes.  Pertinent labs & imaging results that were available during my care of the patient were reviewed by me and considered in my medical decision making (see chart for details).     Pt si a 51 yo with ho alcohol abuse, varices, presenting today with low K.  Pt had paracentesis last week for increasing abdominal girth. Had labs drawn today, was being sent for an MRI of her liver by GI.  Labs came back showing low potassium. She was sent to the emergency department from MRI before getting an MRI. Patient has no symptoms  We'll give her potassium with oral and IV.  9:28  PM MRI has an openeing for patient. Patient requires to be discharged from the emergency department in order to get the outpatient MRI. Patient received with IV and by mouth potassium. We do not have time to  rechck the labs. Discussed with patient. She would really like to be able to get an outpatient MRI rather than inpatient as it greatly changes the price differential. We'll have her take oral potassium and follow-up and get labs redrawn on Wednesday this week.   I think that having her stop taking the Lasix in addition to taking additional oral potassium will be enough to help correct her labs.  Final Clinical Impressions(s) / ED Diagnoses   Final diagnoses:  None    New Prescriptions New Prescriptions   No medications  on file     Abelino Derrick, MD 02/05/17 2129

## 2017-02-05 NOTE — Telephone Encounter (Signed)
Patient is at [facility] now for her MRI. Told her to please let them know and then head to the ED there at [facility]. Patient understands to stop the lasix.

## 2017-02-05 NOTE — Telephone Encounter (Signed)
Lab called with critical potassium level of 2.6.

## 2017-02-05 NOTE — ED Triage Notes (Signed)
Pt brought to ED from MRI where she was having images of abd. Prior to going in for test her pcp office called and told her to come to ED asap due to hypokalemia. Pt had paracentesis last week and placed on an additional diuretic. Pt without complaints

## 2017-02-06 ENCOUNTER — Other Ambulatory Visit: Payer: Self-pay

## 2017-02-06 DIAGNOSIS — E876 Hypokalemia: Secondary | ICD-10-CM

## 2017-02-06 LAB — HEPATITIS A ANTIBODY, TOTAL: Hep A Total Ab: NONREACTIVE

## 2017-02-06 LAB — CULTURE, BODY FLUID W GRAM STAIN -BOTTLE: Culture: NO GROWTH

## 2017-02-06 LAB — CULTURE, BODY FLUID-BOTTLE

## 2017-02-06 LAB — HEPATITIS B SURFACE ANTIBODY,QUALITATIVE: Hep B S Ab: NONREACTIVE

## 2017-02-06 MED ORDER — MAGNESIUM CHLORIDE 64 MG PO TBEC: 2.0000 | DELAYED_RELEASE_TABLET | Freq: Two times a day (BID) | ORAL | 0 refills | Status: DC

## 2017-02-08 ENCOUNTER — Ambulatory Visit (INDEPENDENT_AMBULATORY_CARE_PROVIDER_SITE_OTHER): Payer: Self-pay | Admitting: Gastroenterology

## 2017-02-08 ENCOUNTER — Other Ambulatory Visit (INDEPENDENT_AMBULATORY_CARE_PROVIDER_SITE_OTHER): Payer: Self-pay

## 2017-02-08 DIAGNOSIS — Z23 Encounter for immunization: Secondary | ICD-10-CM

## 2017-02-08 DIAGNOSIS — K7031 Alcoholic cirrhosis of liver with ascites: Secondary | ICD-10-CM

## 2017-02-08 LAB — MAGNESIUM: Magnesium: 1.3 mg/dL — ABNORMAL LOW (ref 1.5–2.5)

## 2017-02-08 LAB — COMPREHENSIVE METABOLIC PANEL
ALT: 40 U/L — ABNORMAL HIGH (ref 0–35)
AST: 128 U/L — ABNORMAL HIGH (ref 0–37)
Albumin: 3 g/dL — ABNORMAL LOW (ref 3.5–5.2)
Alkaline Phosphatase: 85 U/L (ref 39–117)
BUN: 4 mg/dL — ABNORMAL LOW (ref 6–23)
CO2: 30 mEq/L (ref 19–32)
Calcium: 8.9 mg/dL (ref 8.4–10.5)
Chloride: 92 mEq/L — ABNORMAL LOW (ref 96–112)
Creatinine, Ser: 0.63 mg/dL (ref 0.40–1.20)
GFR: 105.64 mL/min (ref >60.00)
Glucose, Bld: 139 mg/dL — ABNORMAL HIGH (ref 70–99)
Potassium: 4 mEq/L (ref 3.5–5.1)
Sodium: 130 mEq/L — ABNORMAL LOW (ref 135–145)
Total Bilirubin: 10.7 mg/dL — ABNORMAL HIGH (ref 0.2–1.2)
Total Protein: 7 g/dL (ref 6.0–8.3)

## 2017-02-09 ENCOUNTER — Telehealth: Payer: Self-pay

## 2017-02-09 ENCOUNTER — Other Ambulatory Visit: Payer: Self-pay

## 2017-02-09 DIAGNOSIS — K7031 Alcoholic cirrhosis of liver with ascites: Secondary | ICD-10-CM

## 2017-02-09 MED ORDER — FUROSEMIDE 20 MG PO TABS: 20.0000 mg | ORAL_TABLET | Freq: Every day | ORAL | 1 refills | Status: DC

## 2017-02-09 MED ORDER — POTASSIUM CHLORIDE CRYS ER 20 MEQ PO TBCR: 20.0000 meq | EXTENDED_RELEASE_TABLET | Freq: Every day | ORAL | 0 refills | Status: DC

## 2017-02-09 NOTE — Telephone Encounter (Signed)
Left message for patient to call re: MRI, lab results.

## 2017-02-09 NOTE — Progress Notes (Signed)
done

## 2017-02-12 ENCOUNTER — Telehealth: Payer: Self-pay | Admitting: Gastroenterology

## 2017-02-12 NOTE — Telephone Encounter (Signed)
Patient states that her abdomen is very tight and painful. She is having trouble keeping any food down, only drinking water. The lasix is working, unfortunately she has had several accidents of incontinence before she could get to the bathroom. Please advise.

## 2017-02-12 NOTE — Telephone Encounter (Signed)
Tina FanningJulie she is due for a BMET today, I'd like her to go to the lab - need to recheck electrolytes and alter diuretics as tolerated. If she is continuing to have worsening ascites moving forward she may need a paracentesis later this week - she just had one within 2 weeks and they didn't take off a lot of fluid

## 2017-02-12 NOTE — Telephone Encounter (Signed)
Patient will try to get the labs done tomorrow morning. She states that her abdomen is more uncomfortable that 2 weeks ago. She feels that vomiting was related to reflux, relieved by belching. I told her that she will need to get labs done, then Dr. Adela LankArmbruster can make the determination about the paracentesis.

## 2017-02-16 ENCOUNTER — Telehealth: Payer: Self-pay | Admitting: Gastroenterology

## 2017-02-16 ENCOUNTER — Other Ambulatory Visit: Payer: Self-pay

## 2017-02-16 DIAGNOSIS — K7031 Alcoholic cirrhosis of liver with ascites: Secondary | ICD-10-CM

## 2017-02-16 NOTE — Telephone Encounter (Signed)
Thanks Tina FanningJulie. I have not seen the labs, she was due for a BMET this week on her current dose of diuretics. If we can't get ahold of those labs we should repeat a BMET next week. thanks

## 2017-02-16 NOTE — Telephone Encounter (Signed)
Patient states that she had labs done at her primary care's office. She did not let us know she was going there, so no order was sent to them. I called Dr. Arville CareBurkhart's office at [facility] today asked them to fax over the recent labs done Tuesday. Patient is very uncomfortable and in pain from the accumulation of ascites fluid. Dr. Adela LankArmbruster okay to schedule paracentesis. Scheduled for 7/[facility] at [facility], albumin order is still in the computer from last paracentesis done on 7/12 and they removed <5 liters of fluid.

## 2017-02-16 NOTE — Telephone Encounter (Signed)
Patient calling about lab results, wanting to speak to Tina Haley

## 2017-02-19 ENCOUNTER — Other Ambulatory Visit (INDEPENDENT_AMBULATORY_CARE_PROVIDER_SITE_OTHER): Payer: Self-pay

## 2017-02-19 ENCOUNTER — Other Ambulatory Visit: Payer: Self-pay

## 2017-02-19 ENCOUNTER — Ambulatory Visit (HOSPITAL_COMMUNITY)
Admission: RE | Admit: 2017-02-19 | Discharge: 2017-02-19 | Disposition: A | Discharge status: Home / Self Care | Payer: Self-pay | Source: Ambulatory Visit | Attending: Gastroenterology | Admitting: Gastroenterology

## 2017-02-19 ENCOUNTER — Encounter (HOSPITAL_COMMUNITY): Payer: Self-pay | Admitting: Interventional Radiology

## 2017-02-19 DIAGNOSIS — R188 Other ascites: Secondary | ICD-10-CM | POA: Insufficient documentation

## 2017-02-19 DIAGNOSIS — K7031 Alcoholic cirrhosis of liver with ascites: Secondary | ICD-10-CM

## 2017-02-19 DIAGNOSIS — K7460 Unspecified cirrhosis of liver: Secondary | ICD-10-CM | POA: Insufficient documentation

## 2017-02-19 HISTORY — PX: IR PARACENTESIS: IMG2679

## 2017-02-19 LAB — BODY FLUID CELL COUNT WITH DIFFERENTIAL
Lymphs, Fluid: 70 %
Monocyte-Macrophage-Serous Fluid: 20 % — ABNORMAL LOW (ref 50–90)
Neutrophil Count, Fluid: 10 % (ref 0–25)
Total Nucleated Cell Count, Fluid: 275 cu mm (ref 0–1000)

## 2017-02-19 LAB — BASIC METABOLIC PANEL
BUN: 8 mg/dL (ref 6–23)
CO2: 25 mEq/L (ref 19–32)
Calcium: 9.2 mg/dL (ref 8.4–10.5)
Chloride: 93 mEq/L — ABNORMAL LOW (ref 96–112)
Creatinine, Ser: 0.61 mg/dL (ref 0.40–1.20)
GFR: 109.63 mL/min (ref >60.00)
Glucose, Bld: 118 mg/dL — ABNORMAL HIGH (ref 70–99)
Potassium: 3.9 mEq/L (ref 3.5–5.1)
Sodium: 125 mEq/L — ABNORMAL LOW (ref 135–145)

## 2017-02-19 MED ORDER — LIDOCAINE HCL (PF) 1 % IJ SOLN
INTRAMUSCULAR | Status: DC | PRN
  Administered 2017-02-19: 10 mL

## 2017-02-19 MED ORDER — LIDOCAINE HCL (PF) 1 % IJ SOLN
INTRAMUSCULAR | Status: AC
  Filled 2017-02-19: qty 30

## 2017-02-19 MED ORDER — ALBUMIN HUMAN 25 % IV SOLN
50.0000 g | Freq: Once | INTRAVENOUS | Status: AC
  Administered 2017-02-19: 50 g via INTRAVENOUS
  Filled 2017-02-19: qty 200

## 2017-02-19 NOTE — Procedures (Signed)
PROCEDURE SUMMARY:  Successful US guided paracentesis from right lateral abdomen.  Yielded 5.8 liters of yellow fluid.  No immediate complications.  Pt tolerated well.   Specimen was sent for labs. Patient to receive 6-8g albumin per liter.  Hoyt KochKacie Sue-Ellen Waleska Buttery PA-C 02/19/2017 10:48 AM

## 2017-02-19 NOTE — Telephone Encounter (Signed)
Spoke to patient, let her know to come get labs done today at our lab. Her PCP office did not fax over lab work as requested. After her paracentesis she will come get that done.

## 2017-02-20 ENCOUNTER — Telehealth: Payer: Self-pay

## 2017-02-20 ENCOUNTER — Other Ambulatory Visit: Payer: Self-pay

## 2017-02-20 DIAGNOSIS — K7031 Alcoholic cirrhosis of liver with ascites: Secondary | ICD-10-CM

## 2017-02-20 LAB — PATHOLOGIST SMEAR REVIEW: Path Review: NEGATIVE

## 2017-02-20 NOTE — Telephone Encounter (Signed)
Called patient, she did get message from Dr. Adela LankArmbruster. Patient asked that I call her back in about an hour as she was doing something at the moment. Lab order placed for Thursday.

## 2017-02-21 ENCOUNTER — Telehealth: Payer: Self-pay

## 2017-02-21 ENCOUNTER — Other Ambulatory Visit: Payer: Self-pay

## 2017-02-21 NOTE — Telephone Encounter (Signed)
-----   Message from Ruffin FrederickSteven Paul Armbruster, MD sent at 02/21/2017  1:40 PM EDT ----- Molli Knockkay thanks. I would refer her, she can decide if she wants to do it.    ----- Message ----- From: Leverne HumblesFournier, Vencil Basnett M, RN Sent: 02/21/2017  12:13 PM To: Ruffin FrederickSteven Paul Armbruster, MD  In trying to refer [facility] to [facility] liver [facility], patient does not have insurance, so she will be self pay. I am still going to send it and patient can decide. I have still not heard back from her, I left a message for her to call back.

## 2017-02-21 NOTE — Telephone Encounter (Signed)
Called patient, had to lvm, let her know that we have referred her to [facility] liver care to discuss liver transplant. Let her know that if she decides she does not want to talk to them about this, then she does not need to make an appointment. Let her know to call back if she has questions or concerns.

## 2017-02-22 ENCOUNTER — Telehealth: Payer: Self-pay

## 2017-02-22 ENCOUNTER — Other Ambulatory Visit (INDEPENDENT_AMBULATORY_CARE_PROVIDER_SITE_OTHER): Payer: Self-pay

## 2017-02-22 DIAGNOSIS — K7031 Alcoholic cirrhosis of liver with ascites: Secondary | ICD-10-CM

## 2017-02-22 LAB — BASIC METABOLIC PANEL
BUN: 5 mg/dL — ABNORMAL LOW (ref 6–23)
CO2: 28 mEq/L (ref 19–32)
Calcium: 9 mg/dL (ref 8.4–10.5)
Chloride: 95 mEq/L — ABNORMAL LOW (ref 96–112)
Creatinine, Ser: 0.53 mg/dL (ref 0.40–1.20)
GFR: 128.94 mL/min (ref >60.00)
Glucose, Bld: 121 mg/dL — ABNORMAL HIGH (ref 70–99)
Potassium: 3.9 mEq/L (ref 3.5–5.1)
Sodium: 130 mEq/L — ABNORMAL LOW (ref 135–145)

## 2017-02-22 NOTE — Telephone Encounter (Signed)
Patient of Dr. Adela LankArmbruster, routed to DOD, patient had BMET done today. Dr. Leone PayorGessner can you please take a look at it. This is a cirrhosis patient and Dr. Adela LankArmbruster had made adjustments to her medications as noted in last result note. If you feel adjustments need to be made please let me know.Thanks.

## 2017-02-22 NOTE — Telephone Encounter (Signed)
BMET reviewed Na improved No changes

## 2017-02-22 NOTE — Telephone Encounter (Signed)
Liver Disease referral has been received . They will contact pt and then let us know when appt date and time is

## 2017-02-23 ENCOUNTER — Other Ambulatory Visit: Payer: Self-pay

## 2017-02-23 DIAGNOSIS — K7031 Alcoholic cirrhosis of liver with ascites: Secondary | ICD-10-CM

## 2017-02-27 ENCOUNTER — Other Ambulatory Visit: Payer: Self-pay

## 2017-02-27 ENCOUNTER — Telehealth: Payer: Self-pay

## 2017-02-27 DIAGNOSIS — K7030 Alcoholic cirrhosis of liver without ascites: Principal | ICD-10-CM

## 2017-02-27 DIAGNOSIS — K7460 Unspecified cirrhosis of liver: Principal | ICD-10-CM

## 2017-02-27 DIAGNOSIS — I8510 Secondary esophageal varices without bleeding: Secondary | ICD-10-CM

## 2017-02-27 IMAGING — US US ABDOMEN COMPLETE
1 series · 13 of 25 positions shown · non-contrast
Comparison: 11/05/2015 limited abdominal sonogram.

CLINICAL DATA: Alcohol abuse.  Right upper quadrant abdominal pain.

EXAM:
ABDOMEN ULTRASOUND COMPLETE

[Series 1: us abdomen complete · 0.20mm/px · 13 of 59 slices shown]
[im 1/59]
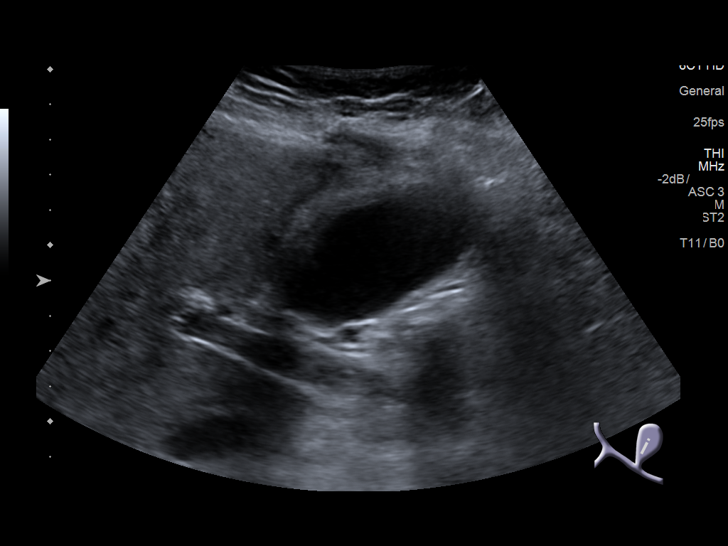
[im 5/59]
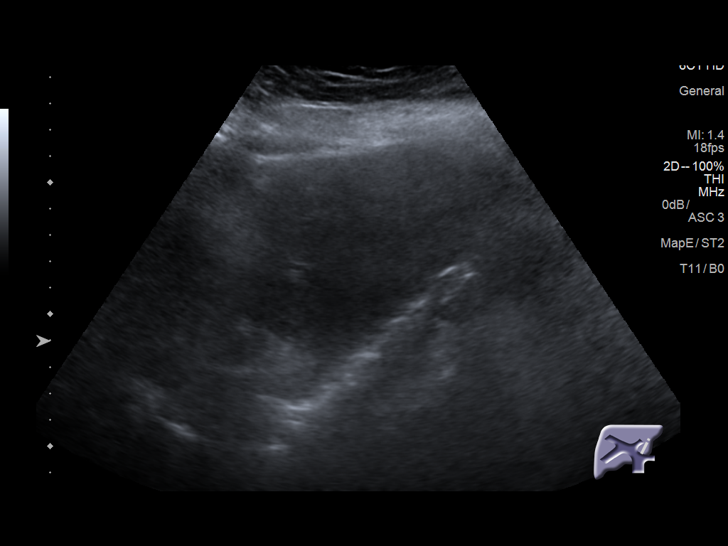
[im 10/59]
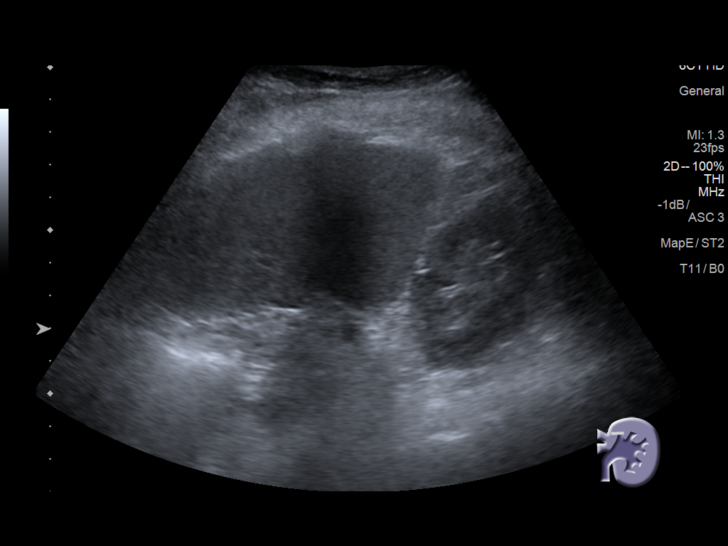
[im 15/59]
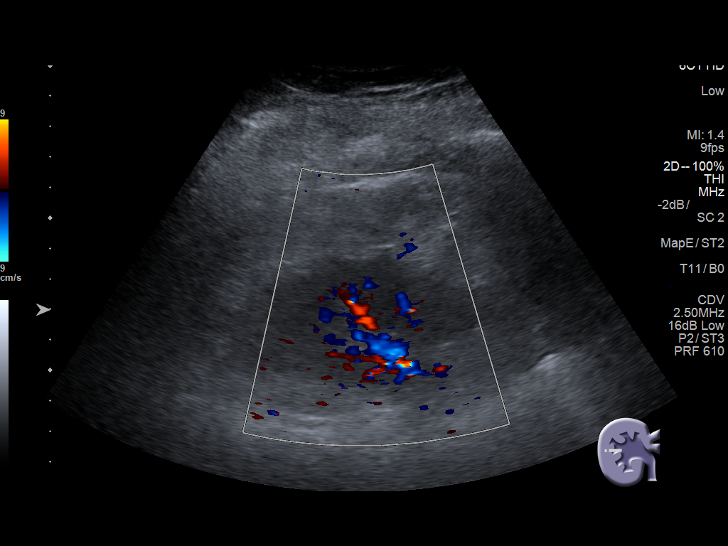
[im 20/59]
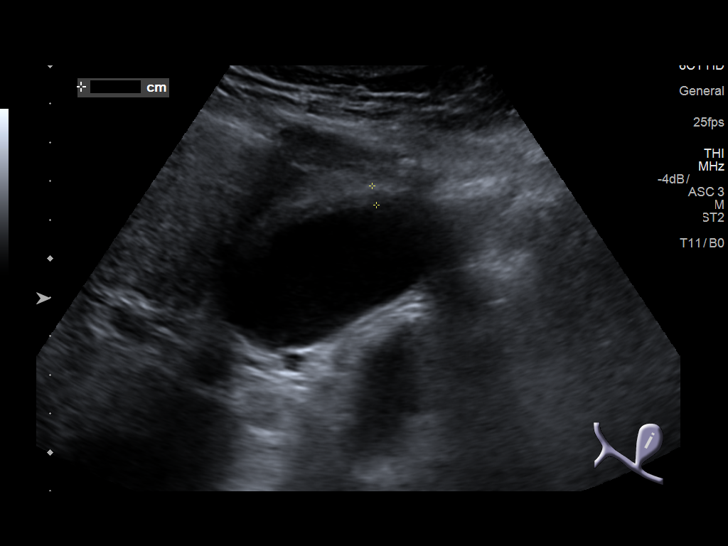
[im 25/59]
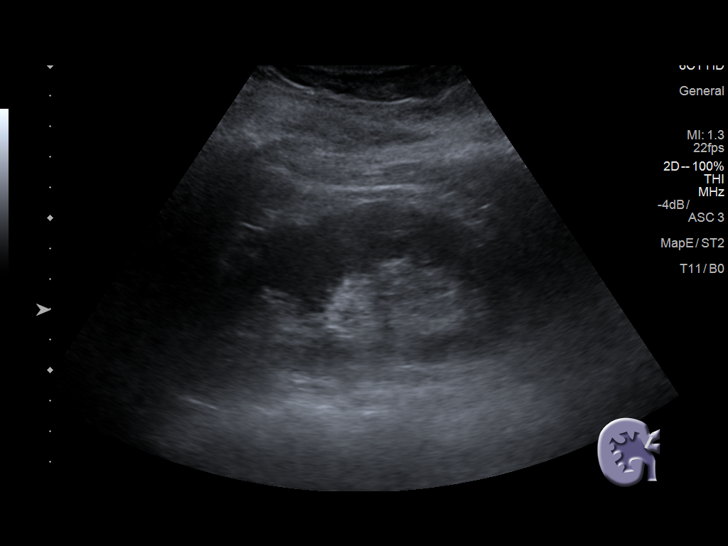
[im 30/59]
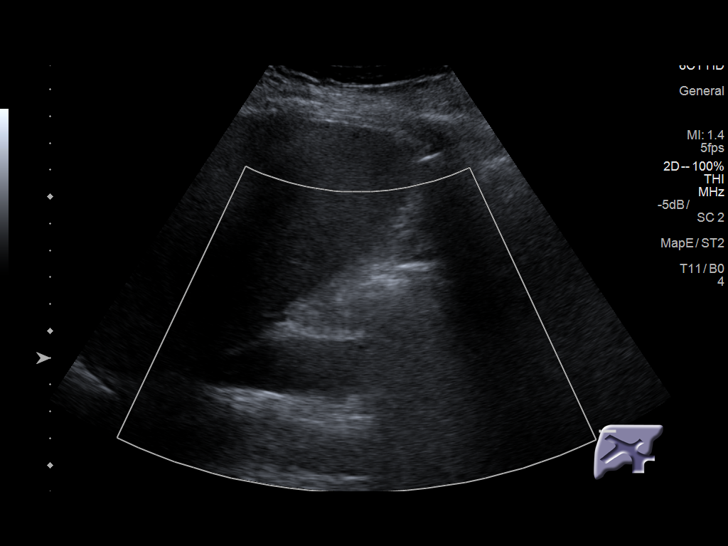
[im 34/59]
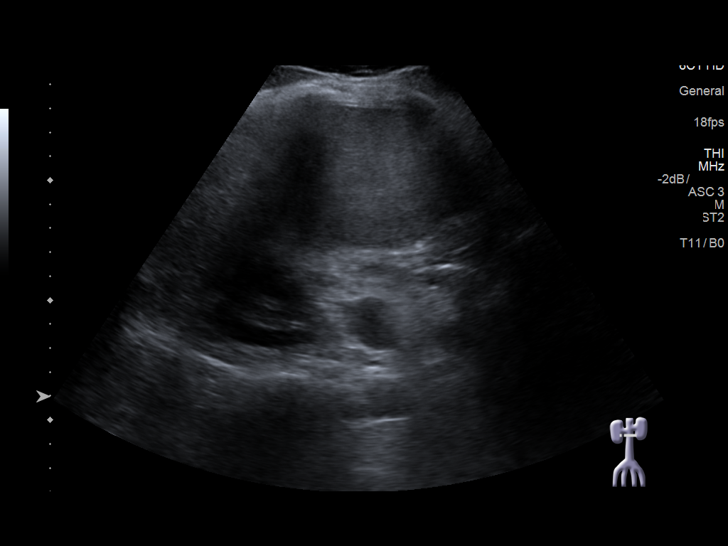
[im 39/59]
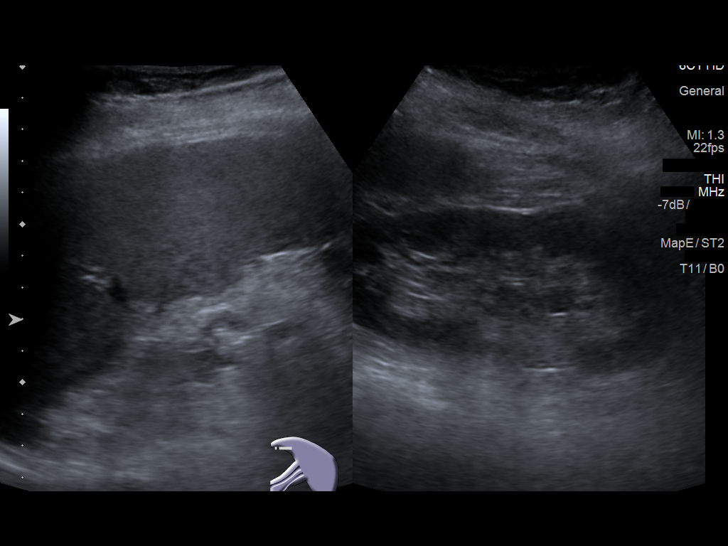
[im 44/59]
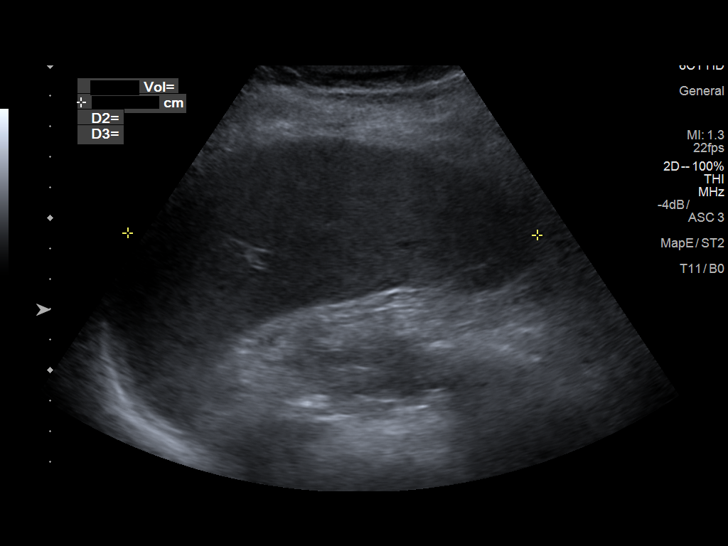
[im 49/59]
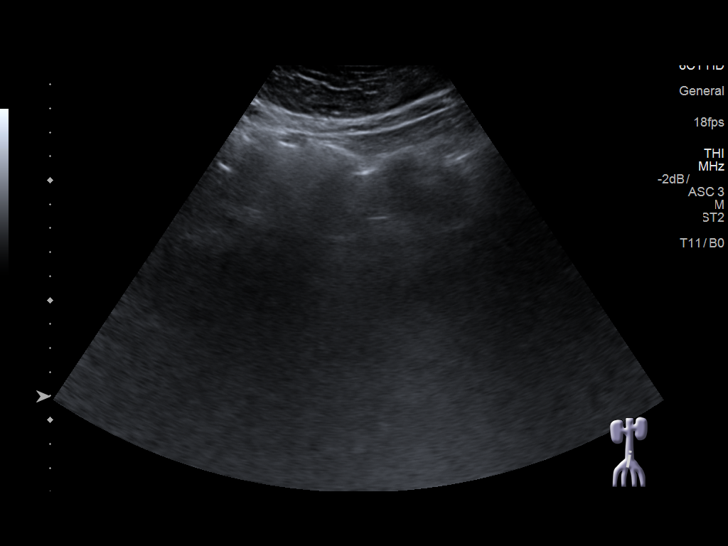
[im 54/59]
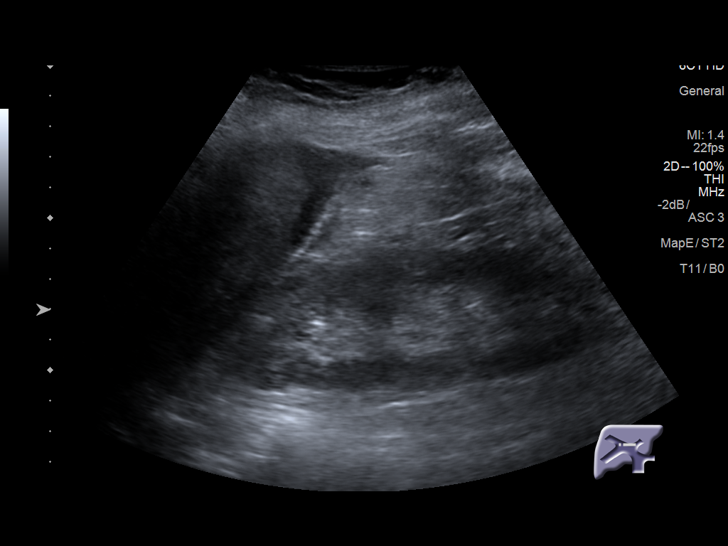
[im 59/59]
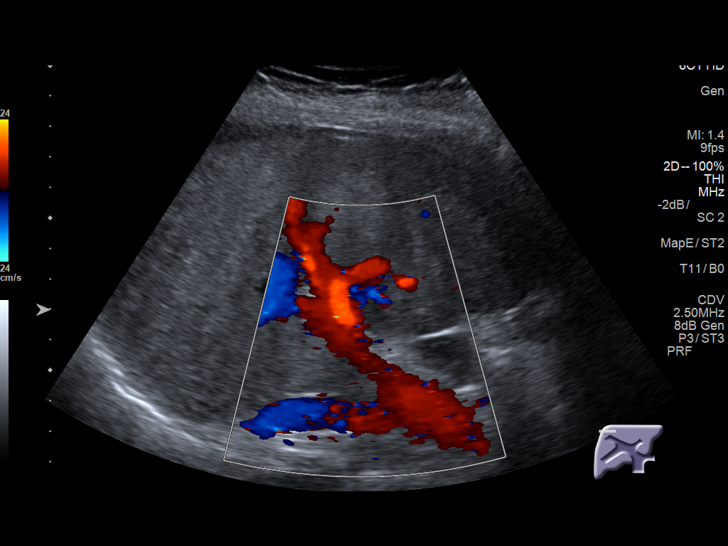

[13 of 25 positions shown; findings below may reference images not displayed]

FINDINGS: Gallbladder: No gallstones. Mild diffuse gallbladder wall
thickening. No pericholecystic fluid or sonographic Murphy's sign.

Common bile duct: Diameter: 3 mm

Liver: Liver parenchyma is diffusely markedly heterogeneous in
echotexture and liver surface is diffusely mildly irregular, in
keeping with cirrhosis. No discrete liver mass is demonstrated.

IVC: No abnormality visualized.

Pancreas: Visualized portion unremarkable.

Spleen: Mild splenomegaly (craniocaudal splenic length 13.4 cm with
splenic volume 621 cc). No splenic mass.

Right Kidney: Length: 11.7 cm. Echogenicity within normal limits. No
mass or hydronephrosis visualized.

Left Kidney: Length: 11.5 cm. Echogenicity within normal limits. No
mass or hydronephrosis visualized.

Abdominal aorta: No aneurysm visualized.

Other findings: Small volume perihepatic ascites.
IMPRESSION: 1. Cirrhosis. Although no discrete liver mass is detected, liver
parenchyma is diffusely markedly heterogeneous and a liver mass
cannot be excluded on the basis of this scan. Recommend short-term
outpatient evaluation with MRI abdomen without and with IV contrast.
2. Small volume perihepatic ascites.
3. Mild diffuse gallbladder wall thickening, with no cholelithiasis,
pericholecystic fluid or sonographic Murphy's sign, favor
noninflammatory gallbladder wall edema.
4. Mild splenomegaly.

## 2017-02-27 NOTE — Telephone Encounter (Signed)
Patient advised to come in the morning on Friday, she will also pick up her EGD prep instructions at our office.

## 2017-02-28 IMAGING — US US PARACENTESIS
1 series · 8 of 8 positions shown · non-contrast
Comparison: None.

MEDICATIONS:
10 cc 1% lidocaine

COMPLICATIONS:
None immediate.

INDICATION: Abd pain

Ascites
EXAM:
ULTRASOUND-GUIDED PARACENTESIS
TECHNIQUE: Informed written consent was obtained from the patient after a
discussion of the risks, benefits and alternatives to treatment. A
timeout was performed prior to the initiation of the procedure.

[Series 1: us paracentesis · 0.26mm/px · 8 of 8 slices shown]
[im 1/8]
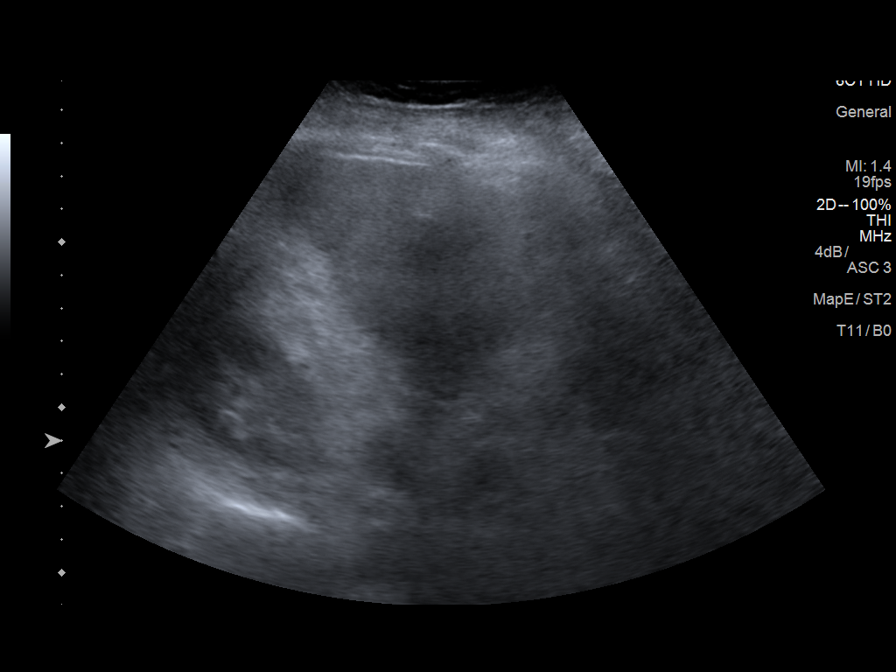
[im 2/8]
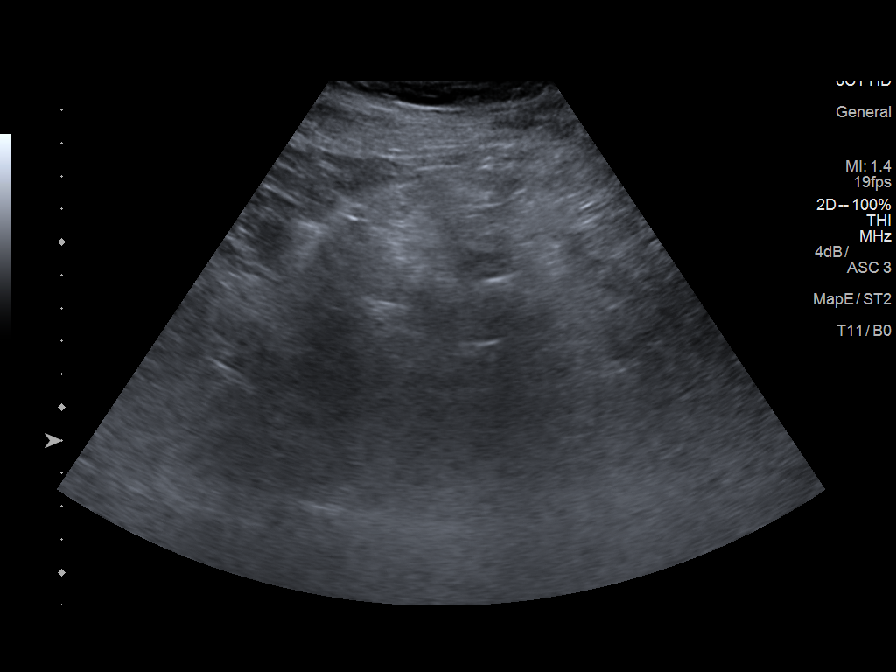
[im 3/8]
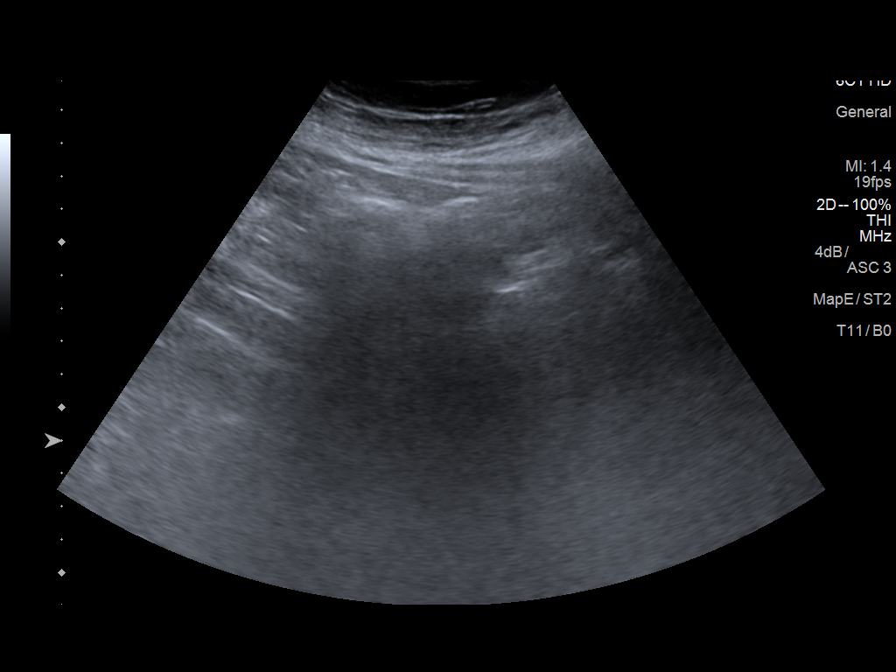
[im 4/8]
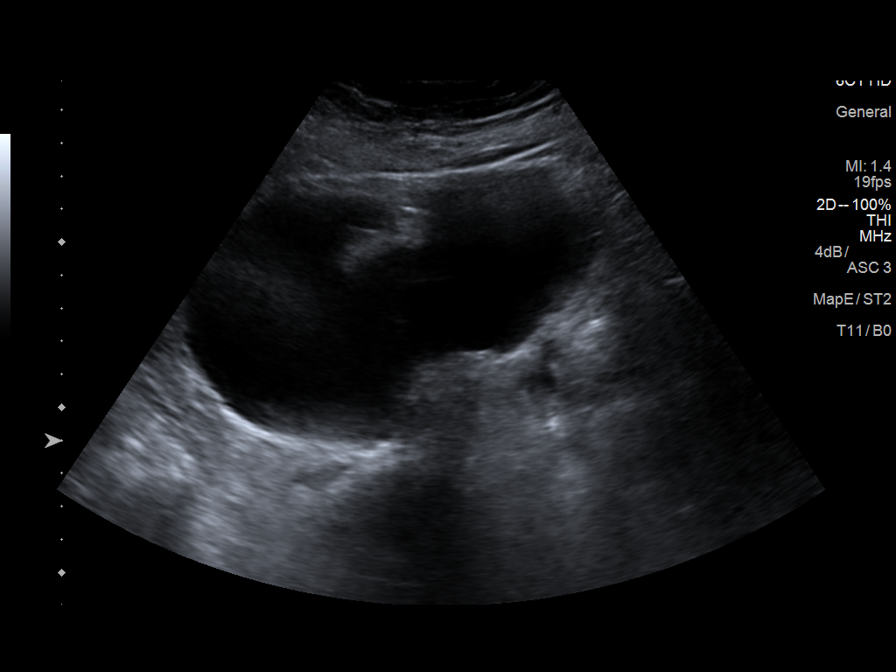
[im 5/8]
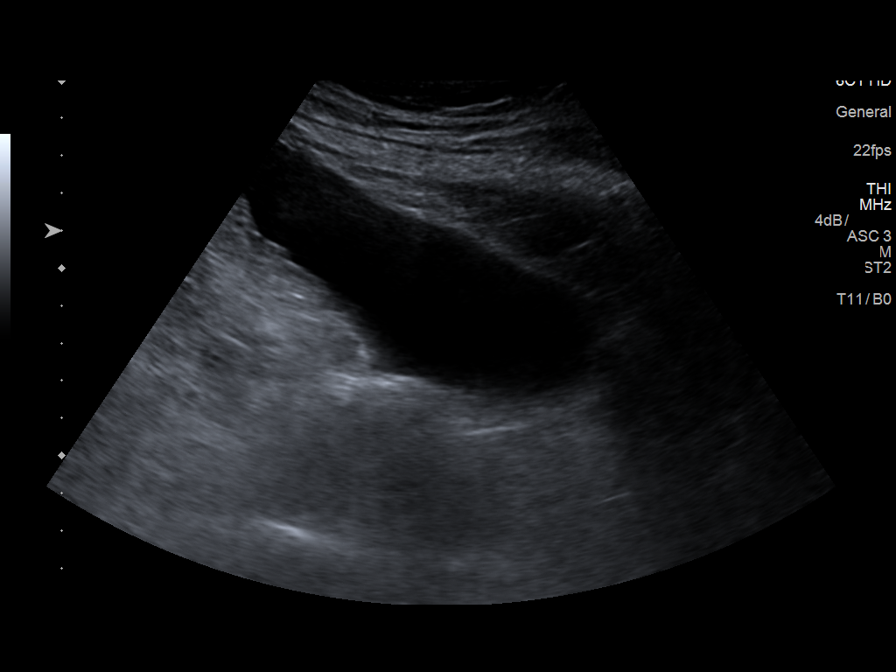
[im 6/8]
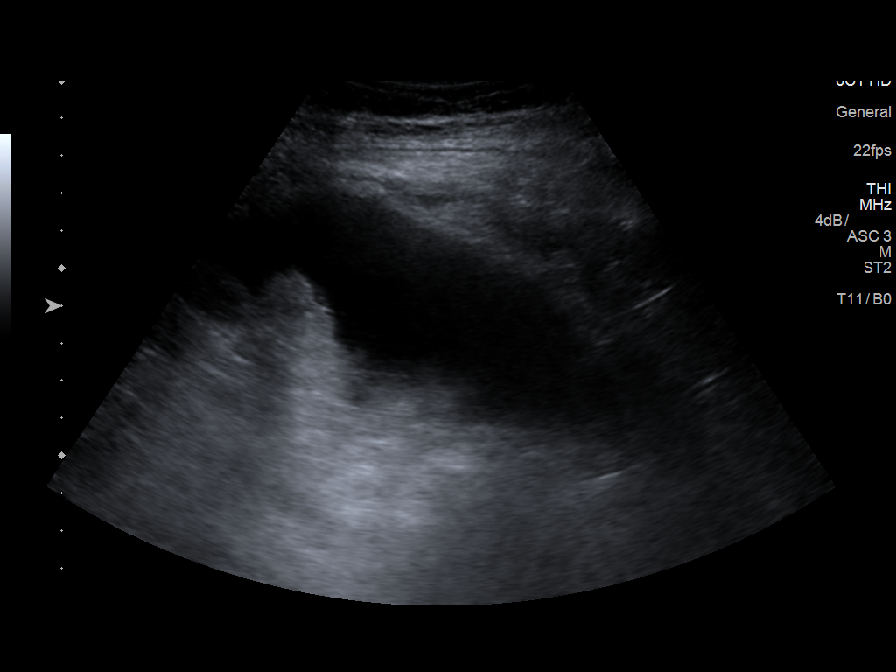
[im 7/8]
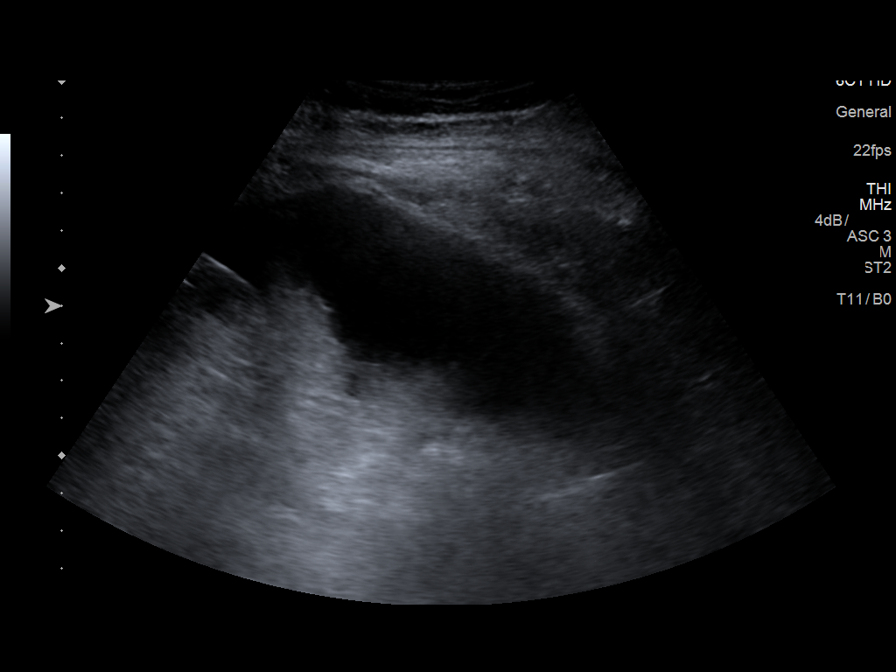
[im 8/8]
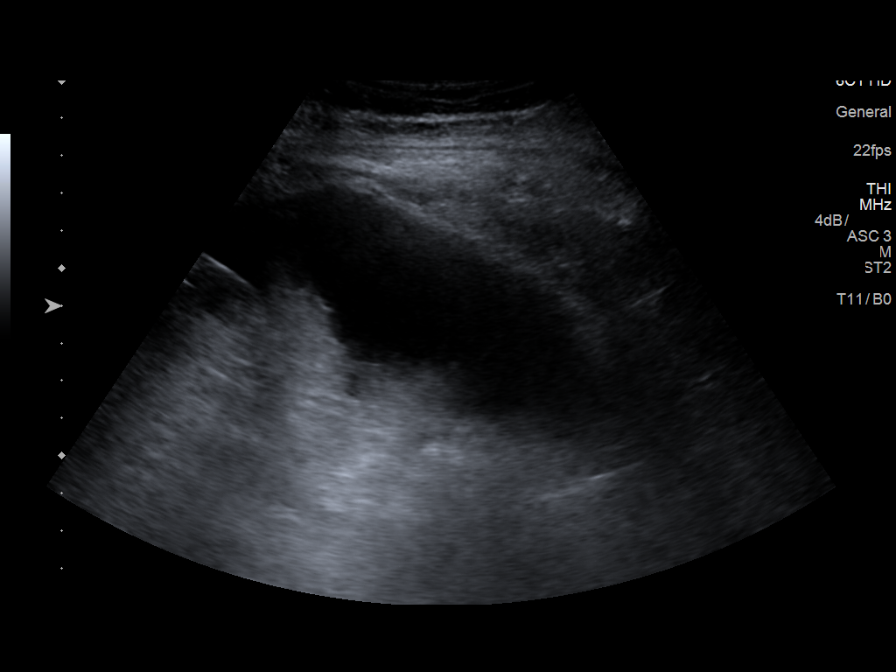

[8 of 8 positions shown; findings below may reference images not displayed]

Initial ultrasound scanning demonstrates a small amount of ascites
within the left lower abdominal quadrant. The left lower abdomen was
prepped and draped in the usual sterile fashion. 1% lidocaine with
epinephrine was used for local anesthesia. Under direct ultrasound
guidance, a 19 gauge, 7-cm, Yueh catheter was introduced. An
ultrasound image was saved for documentation purposed. The
paracentesis was performed.

Access into fluid was obtained using sterile probe guidance. 5 cc
yellow fluid obtained. Pt very painful and moving on table. Access
lost.

Reattempt at access x 3; never able to regain access; patient
painful; stopped procedure.

The catheter was removed and a dressing was applied. The patient
tolerated the procedure well without immediate post procedural
complication.
FINDINGS: A total of approximately 5 cc of yellow fluid was removed. Samples
were sent to the laboratory as requested by the clinical team.
IMPRESSION: Successful ultrasound-guided paracentesis yielding 5 cc of
peritoneal fluid.

Read by:  Quirijn Amazigh

## 2017-03-02 ENCOUNTER — Other Ambulatory Visit (INDEPENDENT_AMBULATORY_CARE_PROVIDER_SITE_OTHER): Payer: Self-pay

## 2017-03-02 DIAGNOSIS — K7031 Alcoholic cirrhosis of liver with ascites: Secondary | ICD-10-CM

## 2017-03-02 LAB — BASIC METABOLIC PANEL
BUN: 6 mg/dL (ref 6–23)
CO2: 28 mEq/L (ref 19–32)
Calcium: 9.1 mg/dL (ref 8.4–10.5)
Chloride: 97 mEq/L (ref 96–112)
Creatinine, Ser: 0.54 mg/dL (ref 0.40–1.20)
GFR: 126.17 mL/min (ref >60.00)
Glucose, Bld: 120 mg/dL — ABNORMAL HIGH (ref 70–99)
Potassium: 3.8 mEq/L (ref 3.5–5.1)
Sodium: 132 mEq/L — ABNORMAL LOW (ref 135–145)

## 2017-03-06 ENCOUNTER — Other Ambulatory Visit: Payer: Self-pay

## 2017-03-06 ENCOUNTER — Telehealth: Payer: Self-pay

## 2017-03-06 DIAGNOSIS — K7031 Alcoholic cirrhosis of liver with ascites: Secondary | ICD-10-CM

## 2017-03-06 NOTE — Telephone Encounter (Signed)
Patient advised of paracent[facility], [facility]. She will get labs done in 2 weeks.

## 2017-03-07 ENCOUNTER — Other Ambulatory Visit: Payer: Self-pay

## 2017-03-07 MED ORDER — PANTOPRAZOLE SODIUM 40 MG PO TBEC: 40.0000 mg | DELAYED_RELEASE_TABLET | Freq: Every day | ORAL | 3 refills | Status: DC

## 2017-03-09 ENCOUNTER — Encounter (HOSPITAL_COMMUNITY): Payer: Self-pay | Admitting: General Surgery

## 2017-03-09 ENCOUNTER — Ambulatory Visit (HOSPITAL_COMMUNITY)
Admission: RE | Admit: 2017-03-09 | Discharge: 2017-03-09 | Disposition: A | Discharge status: Home / Self Care | Payer: Self-pay | Source: Ambulatory Visit | Attending: Gastroenterology | Admitting: Gastroenterology

## 2017-03-09 DIAGNOSIS — K7031 Alcoholic cirrhosis of liver with ascites: Secondary | ICD-10-CM | POA: Insufficient documentation

## 2017-03-09 HISTORY — PX: IR PARACENTESIS: IMG2679

## 2017-03-09 LAB — BODY FLUID CELL COUNT WITH DIFFERENTIAL
Lymphs, Fluid: 71 %
Monocyte-Macrophage-Serous Fluid: 28 % — ABNORMAL LOW (ref 50–90)
Neutrophil Count, Fluid: 1 % (ref 0–25)
Total Nucleated Cell Count, Fluid: 36 cu mm (ref 0–1000)

## 2017-03-09 MED ORDER — FENTANYL CITRATE (PF) 100 MCG/2ML IJ SOLN: 50.0000 ug | Freq: Once | INTRAMUSCULAR | Status: DC

## 2017-03-09 MED ORDER — LIDOCAINE HCL (PF) 1 % IJ SOLN
INTRAMUSCULAR | Status: AC
  Filled 2017-03-09: qty 30

## 2017-03-09 MED ORDER — ALBUMIN HUMAN 25 % IV SOLN
50.0000 g | Freq: Once | INTRAVENOUS | Status: AC
  Administered 2017-03-09: 50 g via INTRAVENOUS
  Filled 2017-03-09: qty 200

## 2017-03-09 NOTE — Procedures (Signed)
Ultrasound-guided therapeutic paracentesis performed yielding 6 liters of serous, icteric colored fluid. No immediate complications.  Tina Haley E 3:44 PM 03/09/2017

## 2017-03-12 LAB — PATHOLOGIST SMEAR REVIEW: Path Review: REACTIVE

## 2017-03-23 ENCOUNTER — Ambulatory Visit (INDEPENDENT_AMBULATORY_CARE_PROVIDER_SITE_OTHER): Payer: Self-pay | Admitting: Gastroenterology

## 2017-03-23 ENCOUNTER — Telehealth: Payer: Self-pay | Admitting: Gastroenterology

## 2017-03-23 ENCOUNTER — Other Ambulatory Visit (INDEPENDENT_AMBULATORY_CARE_PROVIDER_SITE_OTHER): Payer: Self-pay

## 2017-03-23 ENCOUNTER — Other Ambulatory Visit: Payer: Self-pay

## 2017-03-23 DIAGNOSIS — K7031 Alcoholic cirrhosis of liver with ascites: Secondary | ICD-10-CM

## 2017-03-23 DIAGNOSIS — Z23 Encounter for immunization: Secondary | ICD-10-CM

## 2017-03-23 LAB — BASIC METABOLIC PANEL
BUN: 5 mg/dL — ABNORMAL LOW (ref 6–23)
CO2: 29 mEq/L (ref 19–32)
Calcium: 9.1 mg/dL (ref 8.4–10.5)
Chloride: 93 mEq/L — ABNORMAL LOW (ref 96–112)
Creatinine, Ser: 0.53 mg/dL (ref 0.40–1.20)
GFR: 128.9 mL/min (ref >60.00)
Glucose, Bld: 102 mg/dL — ABNORMAL HIGH (ref 70–99)
Potassium: 3.7 mEq/L (ref 3.5–5.1)
Sodium: 129 mEq/L — ABNORMAL LOW (ref 135–145)

## 2017-03-23 NOTE — Telephone Encounter (Signed)
See result note.  

## 2017-03-27 ENCOUNTER — Other Ambulatory Visit: Payer: Self-pay

## 2017-03-27 MED ORDER — SPIRONOLACTONE 100 MG PO TABS: 100.0000 mg | ORAL_TABLET | Freq: Every day | ORAL | 1 refills | Status: DC

## 2017-03-27 NOTE — Telephone Encounter (Signed)
Okay that's fine. Thanks for the update

## 2017-03-27 NOTE — Telephone Encounter (Signed)
Patient only has enough Aldactone 100 mg daily for today and tomorrow. I let her know that I will take care of refill for this time, but asked her to contact her pharmacy for refill requests. Also, she will have insurance starting up on Oct. 1, [facility], she has asked that if an opening comes up for 05/01/17 that we reschedule her. She is trying to consolidate hospital bills and costs.

## 2017-04-02 ENCOUNTER — Other Ambulatory Visit: Payer: Self-pay

## 2017-04-02 ENCOUNTER — Telehealth: Payer: Self-pay | Admitting: Gastroenterology

## 2017-04-02 DIAGNOSIS — K7031 Alcoholic cirrhosis of liver with ascites: Secondary | ICD-10-CM

## 2017-04-03 NOTE — Telephone Encounter (Signed)
Patient advised of scheduled [facility] for [facility]. She asked about lab work, told her that due to possible storm not sure if our building would be open and she could get them done at the hospital.

## 2017-04-06 ENCOUNTER — Ambulatory Visit (HOSPITAL_COMMUNITY)
Admission: RE | Admit: 2017-04-06 | Discharge: 2017-04-06 | Disposition: A | Discharge status: Home / Self Care | Payer: Self-pay | Source: Ambulatory Visit | Attending: Gastroenterology | Admitting: Gastroenterology

## 2017-04-06 ENCOUNTER — Encounter (HOSPITAL_COMMUNITY): Payer: Self-pay | Admitting: Radiology

## 2017-04-06 DIAGNOSIS — R188 Other ascites: Secondary | ICD-10-CM | POA: Insufficient documentation

## 2017-04-06 DIAGNOSIS — K7031 Alcoholic cirrhosis of liver with ascites: Secondary | ICD-10-CM

## 2017-04-06 HISTORY — PX: IR PARACENTESIS: IMG2679

## 2017-04-06 LAB — BODY FLUID CELL COUNT WITH DIFFERENTIAL
Eos, Fluid: 2 %
Lymphs, Fluid: 55 %
Monocyte-Macrophage-Serous Fluid: 41 % — ABNORMAL LOW (ref 50–90)
Neutrophil Count, Fluid: 2 % (ref 0–25)
Total Nucleated Cell Count, Fluid: 164 cu mm (ref 0–1000)

## 2017-04-06 MED ORDER — LIDOCAINE HCL (PF) 1 % IJ SOLN
INTRAMUSCULAR | Status: AC
  Filled 2017-04-06: qty 30

## 2017-04-06 NOTE — Procedures (Signed)
   US guided RLQ paracentesis  2.1 L yellow fluid obtained Sent for labs per MD  Tolerated well

## 2017-04-09 LAB — PATHOLOGIST SMEAR REVIEW

## 2017-04-17 ENCOUNTER — Encounter (HOSPITAL_COMMUNITY): Payer: Self-pay | Admitting: *Deleted

## 2017-04-18 ENCOUNTER — Ambulatory Visit (HOSPITAL_COMMUNITY): Payer: Self-pay | Admitting: Certified Registered Nurse Anesthetist

## 2017-04-18 ENCOUNTER — Ambulatory Visit (HOSPITAL_COMMUNITY)
Admission: RE | Admit: 2017-04-18 | Discharge: 2017-04-18 | Disposition: A | Discharge status: Home / Self Care | Payer: Self-pay | Source: Ambulatory Visit | Attending: Gastroenterology | Admitting: Gastroenterology

## 2017-04-18 ENCOUNTER — Other Ambulatory Visit: Payer: Self-pay | Admitting: Gastroenterology

## 2017-04-18 ENCOUNTER — Encounter (HOSPITAL_COMMUNITY)
Admission: RE | Disposition: A | Discharge status: Home / Self Care | Payer: Self-pay | Source: Ambulatory Visit | Attending: Gastroenterology

## 2017-04-18 ENCOUNTER — Encounter (HOSPITAL_COMMUNITY): Payer: Self-pay | Admitting: *Deleted

## 2017-04-18 DIAGNOSIS — Z8379 Family history of other diseases of the digestive system: Secondary | ICD-10-CM | POA: Insufficient documentation

## 2017-04-18 DIAGNOSIS — K7031 Alcoholic cirrhosis of liver with ascites: Secondary | ICD-10-CM

## 2017-04-18 DIAGNOSIS — D509 Iron deficiency anemia, unspecified: Secondary | ICD-10-CM | POA: Insufficient documentation

## 2017-04-18 DIAGNOSIS — G43909 Migraine, unspecified, not intractable, without status migrainosus: Secondary | ICD-10-CM | POA: Insufficient documentation

## 2017-04-18 DIAGNOSIS — F419 Anxiety disorder, unspecified: Secondary | ICD-10-CM | POA: Insufficient documentation

## 2017-04-18 DIAGNOSIS — K760 Fatty (change of) liver, not elsewhere classified: Secondary | ICD-10-CM | POA: Insufficient documentation

## 2017-04-18 DIAGNOSIS — Z833 Family history of diabetes mellitus: Secondary | ICD-10-CM | POA: Insufficient documentation

## 2017-04-18 DIAGNOSIS — Z8249 Family history of ischemic heart disease and other diseases of the circulatory system: Secondary | ICD-10-CM | POA: Insufficient documentation

## 2017-04-18 DIAGNOSIS — K7030 Alcoholic cirrhosis of liver without ascites: Secondary | ICD-10-CM

## 2017-04-18 DIAGNOSIS — Z6822 Body mass index (BMI) 22.0-22.9, adult: Secondary | ICD-10-CM | POA: Insufficient documentation

## 2017-04-18 DIAGNOSIS — R161 Splenomegaly, not elsewhere classified: Secondary | ICD-10-CM | POA: Insufficient documentation

## 2017-04-18 DIAGNOSIS — I8510 Secondary esophageal varices without bleeding: Secondary | ICD-10-CM

## 2017-04-18 DIAGNOSIS — Z8719 Personal history of other diseases of the digestive system: Secondary | ICD-10-CM | POA: Insufficient documentation

## 2017-04-18 DIAGNOSIS — E669 Obesity, unspecified: Secondary | ICD-10-CM | POA: Insufficient documentation

## 2017-04-18 DIAGNOSIS — I8511 Secondary esophageal varices with bleeding: Secondary | ICD-10-CM | POA: Insufficient documentation

## 2017-04-18 DIAGNOSIS — Z8711 Personal history of peptic ulcer disease: Secondary | ICD-10-CM | POA: Insufficient documentation

## 2017-04-18 HISTORY — DX: Adverse effect of unspecified anesthetic, initial encounter: T41.45XA

## 2017-04-18 HISTORY — DX: Other complications of anesthesia, initial encounter: T88.59XA

## 2017-04-18 HISTORY — PX: ESOPHAGOGASTRODUODENOSCOPY: SHX5428

## 2017-04-18 HISTORY — DX: Other ascites: R18.8

## 2017-04-18 HISTORY — PX: ESOPHAGEAL BANDING: SHX5518

## 2017-04-18 SURGERY — EGD (ESOPHAGOGASTRODUODENOSCOPY)
Anesthesia: Monitor Anesthesia Care

## 2017-04-18 MED ORDER — SODIUM CHLORIDE 0.9 % IV SOLN: INTRAVENOUS | Status: DC

## 2017-04-18 MED ORDER — ONDANSETRON HCL 4 MG/2ML IJ SOLN
INTRAMUSCULAR | Status: AC
  Filled 2017-04-18: qty 2

## 2017-04-18 MED ORDER — PROPOFOL 10 MG/ML IV BOLUS
INTRAVENOUS | Status: AC
  Filled 2017-04-18: qty 40

## 2017-04-18 MED ORDER — LACTATED RINGERS IV SOLN
INTRAVENOUS | Status: DC
  Administered 2017-04-18 (×2): via INTRAVENOUS

## 2017-04-18 MED ORDER — LIDOCAINE HCL (CARDIAC) 20 MG/ML IV SOLN
INTRAVENOUS | Status: DC | PRN
  Administered 2017-04-18: 50 mg via INTRAVENOUS

## 2017-04-18 MED ORDER — PROPOFOL 10 MG/ML IV BOLUS
INTRAVENOUS | Status: DC | PRN
Start: 2017-04-18 — End: 2017-04-18
  Administered 2017-04-18 (×4): 30 mg via INTRAVENOUS

## 2017-04-18 MED ORDER — LIDOCAINE 2% (20 MG/ML) 5 ML SYRINGE
INTRAMUSCULAR | Status: AC
  Filled 2017-04-18: qty 5

## 2017-04-18 MED ORDER — PROPOFOL 500 MG/50ML IV EMUL
INTRAVENOUS | Status: DC | PRN
  Administered 2017-04-18: 150 ug/kg/min via INTRAVENOUS

## 2017-04-18 NOTE — Anesthesia Postprocedure Evaluation (Signed)
Anesthesia Post Note  Patient: Tina Haley  Procedure(s) Performed: Procedure(s) (LRB): ESOPHAGOGASTRODUODENOSCOPY (EGD) possible varice banding (N/A) ESOPHAGEAL BANDING (N/A)     Patient location during evaluation: PACU Anesthesia Type: MAC Level of consciousness: awake and alert Pain management: pain level controlled Vital Signs Assessment: post-procedure vital signs reviewed and stable Respiratory status: spontaneous breathing Cardiovascular status: stable Anesthetic complications: no    Last Vitals:  Vitals:   04/18/17 0911 04/18/17 0925  BP: 138/65 138/69  Pulse: (!) 116 (!) 107  Resp: 18 13  Temp: 36.6 C   SpO2: 100% 100%    Last Pain:  Vitals:   04/18/17 0911  TempSrc: Oral                 Nolon Nations

## 2017-04-18 NOTE — Anesthesia Preprocedure Evaluation (Signed)
Anesthesia Evaluation  Patient identified by MRN, date of birth, ID band Patient awake    Reviewed: Allergy & Precautions, NPO status , Patient's Chart, lab work & pertinent test results  Airway Mallampati: II  TM Distance: >3 FB Neck ROM: Full    Dental no notable dental hx. (+) Dental Advisory Given, Missing, Chipped   Pulmonary neg pulmonary ROS,    Pulmonary exam normal breath sounds clear to auscultation       Cardiovascular negative cardio ROS Normal cardiovascular exam Rhythm:Regular Rate:Normal     Neuro/Psych  Headaches, PSYCHIATRIC DISORDERS Anxiety    GI/Hepatic negative GI ROS, (+) Cirrhosis   Esophageal Varices  substance abuse  alcohol use,   Endo/Other  negative endocrine ROS  Renal/GU negative Renal ROS     Musculoskeletal negative musculoskeletal ROS (+)   Abdominal (+) + obese,   Peds  Hematology  (+) anemia ,   Anesthesia Other Findings   Reproductive/Obstetrics negative OB ROS                             Anesthesia Physical  Anesthesia Plan  ASA: III  Anesthesia Plan: MAC   Post-op Pain Management:    Induction: Intravenous  PONV Risk Score and Plan: 2 and Ondansetron and Propofol infusion  Airway Management Planned: Natural Airway  Additional Equipment:   Intra-op Plan:   Post-operative Plan:   Informed Consent: I have reviewed the patients History and Physical, chart, labs and discussed the procedure including the risks, benefits and alternatives for the proposed anesthesia with the patient or authorized representative who has indicated his/her understanding and acceptance.   Dental advisory given  Plan Discussed with: CRNA  Anesthesia Plan Comments:         Anesthesia Quick Evaluation

## 2017-04-18 NOTE — Discharge Instructions (Signed)
YOU HAD AN ENDOSCOPIC PROCEDURE TODAY: Refer to the procedure report and other information in the discharge instructions given to you for any specific questions about what was found during the examination. If this information does not answer your questions, please call [place] office at [phone] to clarify.  ° °YOU SHOULD EXPECT: Some feelings of bloating in the abdomen. Passage of more gas than usual. Walking can help get rid of the air that was put into your GI tract during the procedure and reduce the bloating. If you had a lower endoscopy (such as a colonoscopy or flexible sigmoidoscopy) you may notice spotting of blood in your stool or on the toilet paper. Some abdominal soreness may be present for a day or two, also. ° °DIET: Your first meal following the procedure should be a light meal and then it is ok to progress to your normal diet. A half-sandwich or bowl of soup is an example of a good first meal. Heavy or fried foods are harder to digest and may make you feel nauseous or bloated. Drink plenty of fluids but you should avoid alcoholic beverages for 24 hours. If you had a esophageal dilation, please see attached instructions for diet.   ° °ACTIVITY: Your care partner should take you home directly after the procedure. You should plan to take it easy, moving slowly for the rest of the day. You can resume normal activity the day after the procedure however YOU SHOULD NOT DRIVE, use power tools, machinery or perform tasks that involve climbing or major physical exertion for 24 hours (because of the sedation medicines used during the test).  ° °SYMPTOMS TO REPORT IMMEDIATELY: °A gastroenterologist can be reached at any hour. Please call [phone]  for any of the following symptoms:  °Following lower endoscopy (colonoscopy, flexible sigmoidoscopy) °Excessive amounts of blood in the stool  °Significant tenderness, worsening of abdominal pains  °Swelling of the abdomen that is new, acute  °Fever of 100° or  higher  °Following upper endoscopy (EGD, EUS, ERCP, esophageal dilation) °Vomiting of blood or coffee ground material  °New, significant abdominal pain  °New, significant chest pain or pain under the shoulder blades  °Painful or persistently difficult swallowing  °New shortness of breath  °Black, tarry-looking or red, bloody stools ° °FOLLOW UP:  °If any biopsies were taken you will be contacted by phone or by letter within the next 1-3 weeks. Call [phone]  if you have not heard about the biopsies in 3 weeks.  °Please also call with any specific questions about appointments or follow up tests. ° °

## 2017-04-18 NOTE — Transfer of Care (Signed)
Immediate Anesthesia Transfer of Care Note  Patient: Tina Haley  Procedure(s) Performed: Procedure(s): ESOPHAGOGASTRODUODENOSCOPY (EGD) possible varice banding (N/A) ESOPHAGEAL BANDING (N/A)  Patient Location: PACU  Anesthesia Type:MAC  Level of Consciousness:  sedated, patient cooperative and responds to stimulation  Airway & Oxygen Therapy:Patient Spontanous Breathing and Patient connected to face mask oxgen  Post-op Assessment:  Report given to PACU RN and Post -op Vital signs reviewed and stable  Post vital signs:  Reviewed and stable  Last Vitals:  Vitals:   04/18/17 0752  BP: 134/64  Resp: (!) 21  Temp: 36.6 C  SpO2: 53%    Complications: No apparent anesthesia complications

## 2017-04-18 NOTE — H&P (Signed)
HPI:   Tina Haley is a 51 y.o. 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 Last EGD was last year, she did not follow up as recommended. Overall doing okay, no bleeding symptoms. Stopped nadolol due to worsening ascites.   Past Medical History:  Diagnosis Date  . Alcohol abuse 06/2015  . Anemia 06/2015   due to ulcer bleeding, transfused 1 PRBC.    Marland Kitchen Anxiety 06/2015   Panic attacks  . Ascites   . Chronic gastritis   . Chronic headaches 06/2015   RARE MIGRAINE  . Cirrhosis (HCC)   . Complication of anesthesia    11-30-15 ENDOSCOPY DID NOT GO WELL PAIN WITH MAY 2017 ENDOSCOPY WENT WELL PAIN FREE  . Esophageal varices (HCC)   . Fatty liver 06/2015  . GI bleed 06/2015, 10/2015   gastric ulcer per EGD dr Braulio Conte in [place]  . IDA (iron deficiency anemia)   . Splenomegaly     Past Surgical History:  Procedure Laterality Date  . ESOPHAGOGASTRODUODENOSCOPY N/A 11/04/2015   Procedure: ESOPHAGOGASTRODUODENOSCOPY (EGD);  Surgeon: Ruffin Frederick, MD;  Location: [facility] ENDOSCOPY;  Service: Gastroenterology;  Laterality: N/A;  . ESOPHAGOGASTRODUODENOSCOPY (EGD) WITH PROPOFOL N/A 11/30/2015   Procedure: ESOPHAGOGASTRODUODENOSCOPY (EGD) WITH PROPOFOL;  Surgeon: Ruffin Frederick, MD;  Location: [facility] ENDOSCOPY;  Service: Gastroenterology;  Laterality: N/A;  . IR PARACENTESIS  02/01/2017  . IR PARACENTESIS  02/19/2017  . IR PARACENTESIS  03/09/2017  . IR PARACENTESIS  04/06/2017  . ORIF TIBIA & FIBULA FRACTURES      Family History  Problem Relation Age of Onset  . Peptic Ulcer Father   . Peripheral Artery Disease Father   . Diabetes Mother   . Coronary artery disease Mother   . Heart disease Mother   . Diabetes Maternal Aunt   . Heart disease Maternal Grandfather   . Stomach cancer Neg Hx   . Colon cancer Neg Hx   . Rectal cancer Neg Hx   . Pancreatic cancer Neg Hx      Social  History  Substance Use Topics  . Smoking status: Never Smoker  . Smokeless tobacco: Never Used  . Alcohol use No     Comment: as of November 03, 2015-has had no alcohol to drink    Prior to Admission medications   Medication Sig Start Date End Date Taking? Authorizing Provider  pantoprazole (PROTONIX) 40 MG tablet Take 1 tablet (40 mg total) by mouth daily. 03/07/17  Yes Kramer Hanrahan, Willaim Rayas, MD  Prenatal Vit-Fe Fumarate-FA (PRENATAL MULTIVITAMIN) TABS tablet Take 1 tablet by mouth daily.    Yes [provider]  spironolactone (ALDACTONE) 100 MG tablet Take 1 tablet (100 mg total) by mouth daily. 03/27/17  Yes Ameya Vowell, Willaim Rayas, MD  furosemide (LASIX) 20 MG tablet Take 1 tablet (20 mg total) by mouth daily. Patient taking differently: Take 20 mg by mouth daily as needed for edema.  02/09/17   Vane Yapp, Willaim Rayas, MD    Current Facility-Administered Medications  Medication Dose Route Frequency Provider Last Rate Last Dose  . lactated ringers infusion   Intravenous Continuous Trannie Bardales, Willaim Rayas, MD        Allergies as of 02/23/2017  . (No Known Allergies)     Review of Systems:    As per HPI, otherwise negative    Physical Exam:  Vital signs in last 24 hours:     General:   Pleasant  female in NAD Lungs: Respirations even and unlabored. Lungs clear to auscultation bilaterally.    Heart:  Regular rate and rhythm; no MRG Abdomen:  Soft, moderate distended, nontender. No appreciable masses or hepatomegaly.  Extremities:  Without edema. Neurologic:  Alert and  oriented x4;  grossly normal neurologically.  LAB RESULTS: No results for input(s): WBC, HGB, HCT, PLT in the last 72 hours. BMET No results for input(s): NA, K, CL, CO2, GLUCOSE, BUN, CREATININE, CALCIUM in the last 72 hours. LFT No results for input(s): PROT, ALBUMIN, AST, ALT, ALKPHOS, BILITOT, BILIDIR, IBILI in the last 72 hours. PT/INR No results for input(s): LABPROT, INR in the last 72  hours.  STUDIES: No results found.   PREVIOUS ENDOSCOPIES:               Impression / Plan:   51 y/o female with decompensated cirrhosis, history of bleeding varices, here for surveillance EGD with banding as needed. Off nadolol due to refractory ascites. I have discussed risks / benefits of EGD with anesthesia with her, she wished to proceed.   Ileene Patrick, MD [facility] Pager [phone]

## 2017-04-18 NOTE — Interval H&P Note (Signed)
History and Physical Interval Note:  04/18/2017 7:52 AM  Tina Haley  has presented today for surgery, with the diagnosis of cirrhosis, screen for esophageal varices  The various methods of treatment have been discussed with the patient and family. After consideration of risks, benefits and other options for treatment, the patient has consented to  Procedure(s): ESOPHAGOGASTRODUODENOSCOPY (EGD) possible varice banding (N/A) ESOPHAGEAL BANDING (N/A) as a surgical intervention .  The patient's history has been reviewed, patient examined, no change in status, stable for surgery.  I have reviewed the patient's chart and labs.  Questions were answered to the patient's satisfaction.     Viviann Spare P Armbruster

## 2017-04-18 NOTE — Op Note (Signed)
[facility] Patient Name: Tina Haley Procedure Date: 04/18/2017 MRN: [id number] Attending MD: Tina Haley. Tina Koch MD, MD Date of Birth: [birth year] CSN: [id number] Age: 51 Admit Type: Outpatient Procedure:                Upper GI endoscopy Indications:              For surveillance / therapy of esophageal varices,                            history of decompensated cirrhosis, history of                            variceal bleeding in 2017, off beta blockade due to                            worsening ascites Providers:                Tina Haley. Tina Barreras MD, MD, Tina End, RN,                            Tina Haley, Technician Referring MD:              Medicines:                Monitored Anesthesia Care Complications:            No immediate complications. Estimated blood loss:                            Minimal. Estimated Blood Loss:     Estimated blood loss was minimal. Procedure:                Pre-Anesthesia Assessment:                           - Prior to the procedure, a History and Physical                            was performed, and patient medications and                            allergies were reviewed. The patient's tolerance of                            previous anesthesia was also reviewed. The risks                            and benefits of the procedure and the sedation                            options and risks were discussed with the patient.                            All questions were answered, and informed consent  was obtained. Prior Anticoagulants: The patient has                            taken no previous anticoagulant or antiplatelet                            agents. ASA Grade Assessment: III - A patient with                            severe systemic disease. After reviewing the risks                            and benefits, the patient was deemed in                            satisfactory  condition to undergo the procedure.                           After obtaining informed consent, the endoscope was                            passed under direct vision. Throughout the                            procedure, the patient's blood pressure, pulse, and                            oxygen saturations were monitored continuously. The                            EG-[id number] ([id number]) scope was introduced through the                            mouth, and advanced to the second part of duodenum.                            The upper GI endoscopy was accomplished without                            difficulty. The patient tolerated the procedure                            well. Scope In: Scope Out: Findings:      Esophagogastric landmarks were identified: the Z-line was found at 38       cm, the gastroesophageal junction was found at 38 cm and the upper       extent of the gastric folds was found at 38 cm from the incisors.      Varices were found in the middle third of the esophagus and in the lower       third of the esophagus. They were medium in size, no high risk stigmata       for bleeding note. Scarring from prior banding also appreciated. Four       bands were successfully placed (lower and mid  esophagus) resulting in       deflation of varices. There was no bleeding during, and at the Haley, of       the procedure.      The exam of the esophagus was otherwise normal.      The entire examined stomach was normal.      The duodenal bulb and second portion of the duodenum were normal. Impression:               - Esophagogastric landmarks identified.                           - Esophageal varices. Banded x 4 as above                           - Normal stomach.                           - Normal duodenal bulb and second portion of the                            duodenum.                           - No specimens collected. Moderate Sedation:      No moderate sedation, case performed with  MAC Recommendation:           - Patient has a contact number available for                            emergencies. The signs and symptoms of potential                            delayed complications were discussed with the                            patient. Return to normal activities tomorrow.                            Written discharge instructions were provided to the                            patient.                           - Liquid diet today, soft diet tomorrow, advance                            slowly as tolerated thereafter.                           - Continue present medications.                           - No aspirin, ibuprofen, naproxen, or other                            non-steroidal anti-inflammatory drugs.                           -  Repeat upper endoscopy in 4-8 weeks for                            retreatment                           - Go to lab for blood draw (due for BMET) Procedure Code(s):        --- Professional ---                           [phone], Esophagogastroduodenoscopy, flexible,                            transoral; with band ligation of esophageal/gastric                            varices Diagnosis Code(s):        --- Professional ---                           I85.00, Esophageal varices without bleeding CPT copyright 2016 American Medical Association. All rights reserved. The codes documented in this report are preliminary and upon coder review may  be revised to meet current compliance requirements. Tina Haley. Jayana Kotula MD, MD 04/18/2017 9:14:23 AM This report has been signed electronically. Number of Addenda: 0

## 2017-04-19 ENCOUNTER — Encounter (HOSPITAL_COMMUNITY): Payer: Self-pay | Admitting: Gastroenterology

## 2017-04-25 ENCOUNTER — Other Ambulatory Visit: Payer: Self-pay

## 2017-04-25 ENCOUNTER — Telehealth: Payer: Self-pay

## 2017-04-25 ENCOUNTER — Other Ambulatory Visit (HOSPITAL_COMMUNITY)
Admission: RE | Admit: 2017-04-25 | Discharge: 2017-04-25 | Disposition: A | Discharge status: Home / Self Care | Payer: 59 | Source: Ambulatory Visit | Attending: Gastroenterology | Admitting: Gastroenterology

## 2017-04-25 DIAGNOSIS — I8500 Esophageal varices without bleeding: Secondary | ICD-10-CM

## 2017-04-25 DIAGNOSIS — K7031 Alcoholic cirrhosis of liver with ascites: Secondary | ICD-10-CM

## 2017-04-25 LAB — PROTIME-INR
INR: 2.22
Prothrombin Time: 24.4 seconds — ABNORMAL HIGH (ref 11.4–15.2)

## 2017-04-25 LAB — BASIC METABOLIC PANEL
BUN: 19 mg/dL (ref 6–23)
CO2: 28 mEq/L (ref 19–32)
Calcium: 9.3 mg/dL (ref 8.4–10.5)
Chloride: 95 mEq/L — ABNORMAL LOW (ref 96–112)
Creatinine, Ser: 0.99 mg/dL (ref 0.40–1.20)
GFR: 62.65 mL/min (ref >60.00)
Glucose, Bld: 116 mg/dL — ABNORMAL HIGH (ref 70–99)
Potassium: 4 mEq/L (ref 3.5–5.1)
Sodium: 130 mEq/L — ABNORMAL LOW (ref 135–145)

## 2017-04-25 LAB — HEPATIC FUNCTION PANEL
ALT: 24 U/L (ref 14–54)
AST: 61 U/L — ABNORMAL HIGH (ref 15–41)
Albumin: 2.8 g/dL — ABNORMAL LOW (ref 3.5–5.0)
Alkaline Phosphatase: 83 U/L (ref 38–126)
Bilirubin, Direct: 2.9 mg/dL — ABNORMAL HIGH (ref 0.1–0.5)
Indirect Bilirubin: 7.3 mg/dL — ABNORMAL HIGH (ref 0.3–0.9)
Total Bilirubin: 10.2 mg/dL — ABNORMAL HIGH (ref 0.3–1.2)
Total Protein: 7.9 g/dL (ref 6.5–8.1)

## 2017-04-25 LAB — SODIUM: Sodium: 129 mmol/L — ABNORMAL LOW (ref 135–145)

## 2017-04-25 LAB — CREATININE, SERUM
Creatinine, Ser: 1.07 mg/dL — ABNORMAL HIGH (ref 0.44–1.00)
GFR calc Af Amer: >60 mL/min (ref >60)
GFR calc non Af Amer: 59 mL/min — ABNORMAL LOW (ref >60)

## 2017-04-25 NOTE — Telephone Encounter (Signed)
Called patient, lvm with next scheduled date for EGD at [facility] on 06/26/17. Mailed prep instructions to patient, the only other date available in December is 24th, but that will be too far out from last procedure.

## 2017-05-02 NOTE — Telephone Encounter (Signed)
Okay thanks for letting me know

## 2017-05-02 NOTE — Telephone Encounter (Signed)
Received copy of verification from [facility] that they are in the process of verifying pt's insurance benefits prior to scheduling her for an evaluation.  They will notify Dawn Drazek's office and [place] once an appt is scheduled.  Contact for future Pre-transplant coordination is Jamey Reas, Charity fundraiser. Phone: [phone], fax: [phone].

## 2017-05-16 ENCOUNTER — Other Ambulatory Visit: Payer: Self-pay

## 2017-05-16 ENCOUNTER — Telehealth: Payer: Self-pay | Admitting: Gastroenterology

## 2017-05-16 DIAGNOSIS — K7031 Alcoholic cirrhosis of liver with ascites: Secondary | ICD-10-CM

## 2017-05-16 MED ORDER — NA SULFATE-K SULFATE-MG SULF 17.5-3.13-1.6 GM/177ML PO SOLN: 1.0000 | Freq: Once | ORAL | 0 refills | Status: AC

## 2017-05-16 NOTE — Telephone Encounter (Signed)
Yes you can coordinate paracentesis with IR. Please see my other note I sent you regarding her diuretics. She needs repeat BMET to ensure stable. If taking more than 5 L for paracentesis she will need albumin. Please send cell count to rule out SBP. Thanks

## 2017-05-16 NOTE — Telephone Encounter (Signed)
[facility], Dr. Pervis HockingNeil Shah from Hepatology.  He increased her diuretics - aldactone to 100mg , lasix to 20mg . She has had a history of hyponatremia and renal insufficiency on higher doses in the past. I think she warrants a repeat BMET.   Otherwise, they noted the patient was not immune to hep A and B and had not received these vaccines, but the patient did receive these vaccines in August. Can you please let her know this.  She is scheduled for a repeat EGD with me at the hospital on 12/4.  She is otherwise overdue for a colonoscopy, if she has never had one and needs this done for her transplant evaluation.   Raynelle FanningJulie can you ask the patient to go to the lab for a BMET and also schedule [facility]. We can alternatively see if they can add colonoscopy to her EGD at the hospital on 12/4 but I don't think there is room on the schedule that day. Thanks

## 2017-05-16 NOTE — Telephone Encounter (Signed)
Okay why don't we await her repeat labs and see what it show prior to starting Lasix. This was recommended by her Hepatologist but her electrolytes has been tenous,

## 2017-05-16 NOTE — Telephone Encounter (Signed)
Please advise 

## 2017-05-16 NOTE — Telephone Encounter (Signed)
Spoke to patient, let her know to get labs done first to see how electrolytes are before restarting Lasix. She is also aware of colonoscopy that has been added onto scheduled [facility]. I have mailed prep instructions to her and Rx for prep to her pharmacy.

## 2017-05-16 NOTE — Telephone Encounter (Signed)
Spoke to patient, she was unaware of the lasix 20 mg order to start taking. Please advise. She will come get BMET tomorrow when IR paracentesis is done. Patient advised of hep A and B, she is aware that she needs last dose of Hep B.

## 2017-05-17 ENCOUNTER — Ambulatory Visit (HOSPITAL_COMMUNITY)
Admission: RE | Admit: 2017-05-17 | Discharge: 2017-05-17 | Disposition: A | Discharge status: Home / Self Care | Payer: 59 | Source: Ambulatory Visit | Attending: Gastroenterology | Admitting: Gastroenterology

## 2017-05-17 ENCOUNTER — Encounter (HOSPITAL_COMMUNITY): Payer: Self-pay | Admitting: Student

## 2017-05-17 ENCOUNTER — Other Ambulatory Visit: Payer: Self-pay

## 2017-05-17 DIAGNOSIS — K7031 Alcoholic cirrhosis of liver with ascites: Secondary | ICD-10-CM

## 2017-05-17 HISTORY — PX: IR PARACENTESIS: IMG2679

## 2017-05-17 LAB — BODY FLUID CELL COUNT WITH DIFFERENTIAL
Eos, Fluid: NONE SEEN %
Lymphs, Fluid: 72 %
Monocyte-Macrophage-Serous Fluid: 17 % — ABNORMAL LOW (ref 50–90)
Neutrophil Count, Fluid: 11 % (ref 0–25)
Total Nucleated Cell Count, Fluid: 297 cu mm (ref 0–1000)

## 2017-05-17 LAB — BASIC METABOLIC PANEL
Anion gap: 7 (ref 5–15)
BUN: 8 mg/dL (ref 6–20)
CO2: 28 mmol/L (ref 22–32)
Calcium: 8.8 mg/dL — ABNORMAL LOW (ref 8.9–10.3)
Chloride: 93 mmol/L — ABNORMAL LOW (ref 101–111)
Creatinine, Ser: 0.47 mg/dL (ref 0.44–1.00)
GFR calc Af Amer: >60 mL/min (ref >60)
GFR calc non Af Amer: >60 mL/min (ref >60)
Glucose, Bld: 120 mg/dL — ABNORMAL HIGH (ref 65–99)
Potassium: 4 mmol/L (ref 3.5–5.1)
Sodium: 128 mmol/L — ABNORMAL LOW (ref 135–145)

## 2017-05-17 MED ORDER — LIDOCAINE 2% (20 MG/ML) 5 ML SYRINGE
INTRAMUSCULAR | Status: AC
  Filled 2017-05-17: qty 10

## 2017-05-17 MED ORDER — LIDOCAINE HCL (PF) 1 % IJ SOLN
INTRAMUSCULAR | Status: DC | PRN
  Administered 2017-05-17: 10 mL

## 2017-05-17 NOTE — Procedures (Signed)
PROCEDURE SUMMARY:  Successful US guided paracentesis from right lateral abdomen.  Yielded 5.0 liters of yellow fluid.  No immediate complications.  Pt tolerated well.   Specimen was not sent for labs.  Hoyt KochKacie Sue-Ellen Jeovany Huitron PA-C 05/17/2017 1:38 PM

## 2017-05-18 ENCOUNTER — Other Ambulatory Visit: Payer: Self-pay

## 2017-05-18 DIAGNOSIS — K7031 Alcoholic cirrhosis of liver with ascites: Secondary | ICD-10-CM

## 2017-05-18 LAB — PATHOLOGIST SMEAR REVIEW

## 2017-05-22 ENCOUNTER — Other Ambulatory Visit: Payer: Self-pay

## 2017-05-28 ENCOUNTER — Encounter (HOSPITAL_COMMUNITY): Payer: Self-pay | Admitting: Emergency Medicine

## 2017-05-28 DIAGNOSIS — K7031 Alcoholic cirrhosis of liver with ascites: Secondary | ICD-10-CM | POA: Diagnosis present

## 2017-05-28 DIAGNOSIS — K652 Spontaneous bacterial peritonitis: Secondary | ICD-10-CM | POA: Diagnosis present

## 2017-05-28 DIAGNOSIS — K7200 Acute and subacute hepatic failure without coma: Secondary | ICD-10-CM | POA: Diagnosis present

## 2017-05-28 DIAGNOSIS — R12 Heartburn: Secondary | ICD-10-CM | POA: Diagnosis present

## 2017-05-28 DIAGNOSIS — N179 Acute kidney failure, unspecified: Secondary | ICD-10-CM | POA: Diagnosis present

## 2017-05-28 DIAGNOSIS — D684 Acquired coagulation factor deficiency: Secondary | ICD-10-CM | POA: Diagnosis present

## 2017-05-28 DIAGNOSIS — D509 Iron deficiency anemia, unspecified: Secondary | ICD-10-CM | POA: Diagnosis present

## 2017-05-28 DIAGNOSIS — N390 Urinary tract infection, site not specified: Secondary | ICD-10-CM | POA: Diagnosis present

## 2017-05-28 DIAGNOSIS — I8510 Secondary esophageal varices without bleeding: Secondary | ICD-10-CM | POA: Diagnosis present

## 2017-05-28 DIAGNOSIS — E8770 Fluid overload, unspecified: Secondary | ICD-10-CM | POA: Diagnosis present

## 2017-05-28 DIAGNOSIS — A4151 Sepsis due to Escherichia coli [E. coli]: Secondary | ICD-10-CM | POA: Diagnosis not present

## 2017-05-28 DIAGNOSIS — E872 Acidosis: Secondary | ICD-10-CM | POA: Diagnosis present

## 2017-05-28 DIAGNOSIS — B9620 Unspecified Escherichia coli [E. coli] as the cause of diseases classified elsewhere: Secondary | ICD-10-CM | POA: Diagnosis present

## 2017-05-28 DIAGNOSIS — R6521 Severe sepsis with septic shock: Secondary | ICD-10-CM | POA: Diagnosis present

## 2017-05-28 DIAGNOSIS — E875 Hyperkalemia: Secondary | ICD-10-CM | POA: Diagnosis not present

## 2017-05-28 DIAGNOSIS — E876 Hypokalemia: Secondary | ICD-10-CM | POA: Diagnosis present

## 2017-05-28 DIAGNOSIS — K7210 Chronic hepatic failure without coma: Secondary | ICD-10-CM | POA: Diagnosis present

## 2017-05-28 DIAGNOSIS — D696 Thrombocytopenia, unspecified: Secondary | ICD-10-CM | POA: Diagnosis present

## 2017-05-28 DIAGNOSIS — K767 Hepatorenal syndrome: Secondary | ICD-10-CM | POA: Diagnosis present

## 2017-05-28 DIAGNOSIS — T502X5A Adverse effect of carbonic-anhydrase inhibitors, benzothiadiazides and other diuretics, initial encounter: Secondary | ICD-10-CM | POA: Diagnosis not present

## 2017-05-28 DIAGNOSIS — D539 Nutritional anemia, unspecified: Secondary | ICD-10-CM | POA: Diagnosis present

## 2017-05-28 DIAGNOSIS — E871 Hypo-osmolality and hyponatremia: Secondary | ICD-10-CM | POA: Diagnosis present

## 2017-05-28 DIAGNOSIS — K766 Portal hypertension: Secondary | ICD-10-CM | POA: Diagnosis present

## 2017-05-28 DIAGNOSIS — R200 Anesthesia of skin: Secondary | ICD-10-CM | POA: Diagnosis present

## 2017-05-28 DIAGNOSIS — Z7682 Awaiting organ transplant status: Secondary | ICD-10-CM

## 2017-05-28 LAB — COMPREHENSIVE METABOLIC PANEL
ALT: 23 U/L (ref 14–54)
AST: 54 U/L — ABNORMAL HIGH (ref 15–41)
Albumin: 2.2 g/dL — ABNORMAL LOW (ref 3.5–5.0)
Alkaline Phosphatase: 91 U/L (ref 38–126)
Anion gap: 14 (ref 5–15)
BUN: 23 mg/dL — ABNORMAL HIGH (ref 6–20)
CO2: 20 mmol/L — ABNORMAL LOW (ref 22–32)
Calcium: 9 mg/dL (ref 8.9–10.3)
Chloride: 88 mmol/L — ABNORMAL LOW (ref 101–111)
Creatinine, Ser: 2.49 mg/dL — ABNORMAL HIGH (ref 0.44–1.00)
GFR calc Af Amer: 25 mL/min — ABNORMAL LOW (ref >60)
GFR calc non Af Amer: 21 mL/min — ABNORMAL LOW (ref >60)
Glucose, Bld: 108 mg/dL — ABNORMAL HIGH (ref 65–99)
Potassium: 5.1 mmol/L (ref 3.5–5.1)
Sodium: 122 mmol/L — ABNORMAL LOW (ref 135–145)
Total Bilirubin: 10.8 mg/dL — ABNORMAL HIGH (ref 0.3–1.2)
Total Protein: 7.7 g/dL (ref 6.5–8.1)

## 2017-05-28 LAB — CBC
HCT: 29 % — ABNORMAL LOW (ref 36.0–46.0)
Hemoglobin: 9.6 g/dL — ABNORMAL LOW (ref 12.0–15.0)
MCH: 38.4 pg — ABNORMAL HIGH (ref 26.0–34.0)
MCHC: 33.1 g/dL (ref 30.0–36.0)
MCV: 116 fL — ABNORMAL HIGH (ref 78.0–100.0)
Platelets: 254 10*3/uL (ref 150–400)
RBC: 2.5 MIL/uL — ABNORMAL LOW (ref 3.87–5.11)
RDW: 18.3 % — ABNORMAL HIGH (ref 11.5–15.5)
WBC: 23 10*3/uL — ABNORMAL HIGH (ref 4.0–10.5)

## 2017-05-28 LAB — LIPASE, BLOOD: Lipase: 22 U/L (ref 11–51)

## 2017-05-28 NOTE — ED Triage Notes (Signed)
Pt c/o 8/10 abd pain and abd distention, pt [facility], [facility] unable to get her today, pt here to have this procedure done.

## 2017-05-29 ENCOUNTER — Encounter (HOSPITAL_COMMUNITY): Payer: Self-pay | Admitting: Internal Medicine

## 2017-05-29 ENCOUNTER — Inpatient Hospital Stay (HOSPITAL_COMMUNITY): Payer: 59

## 2017-05-29 ENCOUNTER — Inpatient Hospital Stay (HOSPITAL_COMMUNITY)
Admission: EM | Admit: 2017-05-29 | Discharge: 2017-06-03 | DRG: 871 | Payer: 59 | Attending: Pulmonary Disease | Admitting: Pulmonary Disease

## 2017-05-29 DIAGNOSIS — N179 Acute kidney failure, unspecified: Secondary | ICD-10-CM | POA: Diagnosis present

## 2017-05-29 DIAGNOSIS — D684 Acquired coagulation factor deficiency: Secondary | ICD-10-CM | POA: Diagnosis present

## 2017-05-29 DIAGNOSIS — R6510 Systemic inflammatory response syndrome (SIRS) of non-infectious origin without acute organ dysfunction: Secondary | ICD-10-CM | POA: Diagnosis present

## 2017-05-29 DIAGNOSIS — I8510 Secondary esophageal varices without bleeding: Secondary | ICD-10-CM | POA: Diagnosis present

## 2017-05-29 DIAGNOSIS — T502X5A Adverse effect of carbonic-anhydrase inhibitors, benzothiadiazides and other diuretics, initial encounter: Secondary | ICD-10-CM | POA: Diagnosis not present

## 2017-05-29 DIAGNOSIS — R6521 Severe sepsis with septic shock: Secondary | ICD-10-CM | POA: Diagnosis present

## 2017-05-29 DIAGNOSIS — A4151 Sepsis due to Escherichia coli [E. coli]: Secondary | ICD-10-CM | POA: Diagnosis not present

## 2017-05-29 DIAGNOSIS — N390 Urinary tract infection, site not specified: Secondary | ICD-10-CM | POA: Diagnosis present

## 2017-05-29 DIAGNOSIS — E871 Hypo-osmolality and hyponatremia: Secondary | ICD-10-CM | POA: Diagnosis present

## 2017-05-29 DIAGNOSIS — D72829 Elevated white blood cell count, unspecified: Secondary | ICD-10-CM | POA: Diagnosis not present

## 2017-05-29 DIAGNOSIS — K7031 Alcoholic cirrhosis of liver with ascites: Secondary | ICD-10-CM | POA: Diagnosis not present

## 2017-05-29 DIAGNOSIS — Z452 Encounter for adjustment and management of vascular access device: Secondary | ICD-10-CM

## 2017-05-29 DIAGNOSIS — R200 Anesthesia of skin: Secondary | ICD-10-CM | POA: Diagnosis present

## 2017-05-29 DIAGNOSIS — Z5189 Encounter for other specified aftercare: Secondary | ICD-10-CM

## 2017-05-29 DIAGNOSIS — D638 Anemia in other chronic diseases classified elsewhere: Secondary | ICD-10-CM | POA: Diagnosis not present

## 2017-05-29 DIAGNOSIS — K7200 Acute and subacute hepatic failure without coma: Secondary | ICD-10-CM | POA: Diagnosis present

## 2017-05-29 DIAGNOSIS — K652 Spontaneous bacterial peritonitis: Secondary | ICD-10-CM

## 2017-05-29 DIAGNOSIS — N3000 Acute cystitis without hematuria: Secondary | ICD-10-CM

## 2017-05-29 DIAGNOSIS — D539 Nutritional anemia, unspecified: Secondary | ICD-10-CM | POA: Diagnosis not present

## 2017-05-29 DIAGNOSIS — R945 Abnormal results of liver function studies: Secondary | ICD-10-CM | POA: Diagnosis not present

## 2017-05-29 DIAGNOSIS — E872 Acidosis: Secondary | ICD-10-CM | POA: Diagnosis present

## 2017-05-29 DIAGNOSIS — K7290 Hepatic failure, unspecified without coma: Secondary | ICD-10-CM | POA: Diagnosis present

## 2017-05-29 DIAGNOSIS — R188 Other ascites: Secondary | ICD-10-CM

## 2017-05-29 DIAGNOSIS — D649 Anemia, unspecified: Secondary | ICD-10-CM | POA: Diagnosis present

## 2017-05-29 DIAGNOSIS — D509 Iron deficiency anemia, unspecified: Secondary | ICD-10-CM | POA: Diagnosis present

## 2017-05-29 DIAGNOSIS — D689 Coagulation defect, unspecified: Secondary | ICD-10-CM | POA: Diagnosis not present

## 2017-05-29 DIAGNOSIS — E875 Hyperkalemia: Secondary | ICD-10-CM | POA: Diagnosis not present

## 2017-05-29 DIAGNOSIS — Z09 Encounter for follow-up examination after completed treatment for conditions other than malignant neoplasm: Secondary | ICD-10-CM

## 2017-05-29 DIAGNOSIS — K7210 Chronic hepatic failure without coma: Secondary | ICD-10-CM | POA: Diagnosis present

## 2017-05-29 DIAGNOSIS — D696 Thrombocytopenia, unspecified: Secondary | ICD-10-CM | POA: Diagnosis present

## 2017-05-29 DIAGNOSIS — E876 Hypokalemia: Secondary | ICD-10-CM | POA: Diagnosis not present

## 2017-05-29 DIAGNOSIS — K766 Portal hypertension: Secondary | ICD-10-CM | POA: Diagnosis present

## 2017-05-29 DIAGNOSIS — K767 Hepatorenal syndrome: Secondary | ICD-10-CM | POA: Diagnosis not present

## 2017-05-29 DIAGNOSIS — A419 Sepsis, unspecified organism: Secondary | ICD-10-CM

## 2017-05-29 DIAGNOSIS — K7460 Unspecified cirrhosis of liver: Secondary | ICD-10-CM | POA: Diagnosis present

## 2017-05-29 DIAGNOSIS — E8770 Fluid overload, unspecified: Secondary | ICD-10-CM | POA: Diagnosis present

## 2017-05-29 DIAGNOSIS — B9620 Unspecified Escherichia coli [E. coli] as the cause of diseases classified elsewhere: Secondary | ICD-10-CM | POA: Diagnosis present

## 2017-05-29 DIAGNOSIS — K7030 Alcoholic cirrhosis of liver without ascites: Secondary | ICD-10-CM

## 2017-05-29 DIAGNOSIS — R7881 Bacteremia: Secondary | ICD-10-CM | POA: Diagnosis not present

## 2017-05-29 DIAGNOSIS — R12 Heartburn: Secondary | ICD-10-CM | POA: Diagnosis present

## 2017-05-29 LAB — BASIC METABOLIC PANEL
Anion gap: 9 (ref 5–15)
BUN: 25 mg/dL — ABNORMAL HIGH (ref 6–20)
CO2: 21 mmol/L — ABNORMAL LOW (ref 22–32)
Calcium: 8.2 mg/dL — ABNORMAL LOW (ref 8.9–10.3)
Chloride: 94 mmol/L — ABNORMAL LOW (ref 101–111)
Creatinine, Ser: 2.5 mg/dL — ABNORMAL HIGH (ref 0.44–1.00)
GFR calc Af Amer: 25 mL/min — ABNORMAL LOW (ref >60)
GFR calc non Af Amer: 21 mL/min — ABNORMAL LOW (ref >60)
Glucose, Bld: 99 mg/dL (ref 65–99)
Potassium: 5.7 mmol/L — ABNORMAL HIGH (ref 3.5–5.1)
Sodium: 124 mmol/L — ABNORMAL LOW (ref 135–145)

## 2017-05-29 LAB — BLOOD CULTURE ID PANEL (REFLEXED)

## 2017-05-29 LAB — TYPE AND SCREEN
ABO/RH(D): O POS
Antibody Screen: NEGATIVE

## 2017-05-29 LAB — URINALYSIS, ROUTINE W REFLEX MICROSCOPIC
Bilirubin Urine: NEGATIVE
Glucose, UA: NEGATIVE mg/dL
Hgb urine dipstick: NEGATIVE
Ketones, ur: NEGATIVE mg/dL
Nitrite: NEGATIVE
Protein, ur: 30 mg/dL — AB
Specific Gravity, Urine: 1.025 (ref 1.005–1.030)
pH: 5 (ref 5.0–8.0)

## 2017-05-29 LAB — PROTIME-INR
INR: 2.86
Prothrombin Time: 29.8 seconds — ABNORMAL HIGH (ref 11.4–15.2)

## 2017-05-29 LAB — CBC
HCT: 25 % — ABNORMAL LOW (ref 36.0–46.0)
Hemoglobin: 8.4 g/dL — ABNORMAL LOW (ref 12.0–15.0)
MCH: 38.7 pg — ABNORMAL HIGH (ref 26.0–34.0)
MCHC: 33.6 g/dL (ref 30.0–36.0)
MCV: 115.2 fL — ABNORMAL HIGH (ref 78.0–100.0)
Platelets: 181 10*3/uL (ref 150–400)
RBC: 2.17 MIL/uL — ABNORMAL LOW (ref 3.87–5.11)
RDW: 18.6 % — ABNORMAL HIGH (ref 11.5–15.5)
WBC: 21.1 10*3/uL — ABNORMAL HIGH (ref 4.0–10.5)

## 2017-05-29 LAB — DIFFERENTIAL
Band Neutrophils: 0 %
Basophils Absolute: 0 10*3/uL (ref 0.0–0.1)
Basophils Relative: 0 %
Blasts: 0 %
Eosinophils Absolute: 0 10*3/uL (ref 0.0–0.7)
Eosinophils Relative: 0 %
Lymphocytes Relative: 6 %
Lymphs Abs: 1.4 10*3/uL (ref 0.7–4.0)
Metamyelocytes Relative: 0 %
Monocytes Absolute: 2.8 10*3/uL — ABNORMAL HIGH (ref 0.1–1.0)
Monocytes Relative: 12 %
Myelocytes: 0 %
Neutro Abs: 18.8 10*3/uL — ABNORMAL HIGH (ref 1.7–7.7)
Neutrophils Relative %: 82 %
Other: 0 %
Promyelocytes Absolute: 0 %
nRBC: 0 /100 WBC

## 2017-05-29 LAB — HEPATIC FUNCTION PANEL
ALT: 17 U/L (ref 14–54)
AST: 42 U/L — ABNORMAL HIGH (ref 15–41)
Albumin: 1.9 g/dL — ABNORMAL LOW (ref 3.5–5.0)
Alkaline Phosphatase: 68 U/L (ref 38–126)
Bilirubin, Direct: 3.3 mg/dL — ABNORMAL HIGH (ref 0.1–0.5)
Indirect Bilirubin: 5.9 mg/dL — ABNORMAL HIGH (ref 0.3–0.9)
Total Bilirubin: 9.2 mg/dL — ABNORMAL HIGH (ref 0.3–1.2)
Total Protein: 6.8 g/dL (ref 6.5–8.1)

## 2017-05-29 LAB — LACTIC ACID, PLASMA
Lactic Acid, Venous: 2.4 mmol/L (ref 0.5–1.9)
Lactic Acid, Venous: 1.7 mmol/L (ref 0.5–1.9)

## 2017-05-29 LAB — CBG MONITORING, ED
Glucose-Capillary: 98 mg/dL (ref 65–99)
Glucose-Capillary: 89 mg/dL (ref 65–99)

## 2017-05-29 LAB — MRSA PCR SCREENING: MRSA by PCR: NEGATIVE

## 2017-05-29 LAB — I-STAT CG4 LACTIC ACID, ED
Lactic Acid, Venous: 3.65 mmol/L (ref 0.5–1.9)
Lactic Acid, Venous: 3.28 mmol/L (ref 0.5–1.9)

## 2017-05-29 LAB — APTT: aPTT: 46 seconds — ABNORMAL HIGH (ref 24–36)

## 2017-05-29 LAB — CREATININE, URINE, RANDOM: Creatinine, Urine: 215.07 mg/dL

## 2017-05-29 LAB — PROCALCITONIN: Procalcitonin: 2.63 ng/mL

## 2017-05-29 LAB — SODIUM, URINE, RANDOM: Sodium, Ur: <10 mmol/L

## 2017-05-29 LAB — HIV ANTIBODY (ROUTINE TESTING W REFLEX): HIV Screen 4th Generation wRfx: NONREACTIVE

## 2017-05-29 LAB — GLUCOSE, CAPILLARY: Glucose-Capillary: 65 mg/dL (ref 65–99)

## 2017-05-29 MED ORDER — SODIUM CHLORIDE 0.9 % IV BOLUS (SEPSIS)
250.0000 mL | Freq: Once | INTRAVENOUS | Status: AC
  Administered 2017-05-29: 250 mL via INTRAVENOUS

## 2017-05-29 MED ORDER — ONDANSETRON HCL 4 MG/2ML IJ SOLN
4.0000 mg | Freq: Four times a day (QID) | INTRAMUSCULAR | Status: DC | PRN
Start: 2017-05-29 — End: 2017-06-03
  Administered 2017-05-29: 4 mg via INTRAVENOUS

## 2017-05-29 MED ORDER — ALBUMIN HUMAN 25 % IV SOLN
12.5000 g | Freq: Four times a day (QID) | INTRAVENOUS | Status: DC
  Filled 2017-05-29 (×2): qty 50

## 2017-05-29 MED ORDER — FENTANYL CITRATE (PF) 100 MCG/2ML IJ SOLN
25.0000 ug | Freq: Once | INTRAMUSCULAR | Status: AC
Start: 2017-05-29 — End: 2017-05-29
  Administered 2017-05-29: 25 ug via INTRAVENOUS

## 2017-05-29 MED ORDER — FENTANYL CITRATE (PF) 100 MCG/2ML IJ SOLN
100.0000 ug | Freq: Once | INTRAMUSCULAR | Status: DC
  Filled 2017-05-29: qty 2

## 2017-05-29 MED ORDER — SODIUM CHLORIDE 0.9 % IV BOLUS (SEPSIS)
500.0000 mL | Freq: Once | INTRAVENOUS | Status: AC
  Administered 2017-05-29: 500 mL via INTRAVENOUS

## 2017-05-29 MED ORDER — ACETAMINOPHEN 650 MG RE SUPP: 650.0000 mg | Freq: Four times a day (QID) | RECTAL | Status: DC | PRN

## 2017-05-29 MED ORDER — PIPERACILLIN-TAZOBACTAM 3.375 G IVPB
3.3750 g | Freq: Three times a day (TID) | INTRAVENOUS | Status: DC
  Administered 2017-05-29 – 2017-05-30 (×4): 3.375 g via INTRAVENOUS
  Filled 2017-05-29 (×5): qty 50

## 2017-05-29 MED ORDER — ALBUMIN HUMAN 25 % IV SOLN
12.5000 g | Freq: Four times a day (QID) | INTRAVENOUS | Status: AC
  Administered 2017-05-29 (×3): 12.5 g via INTRAVENOUS
  Filled 2017-05-29 (×3): qty 50

## 2017-05-29 MED ORDER — IOPAMIDOL (ISOVUE-300) INJECTION 61%
INTRAVENOUS | Status: AC
  Filled 2017-05-29: qty 30

## 2017-05-29 MED ORDER — ALBUMIN HUMAN 25 % IV SOLN: 25.0000 g | Freq: Once | INTRAVENOUS | Status: DC

## 2017-05-29 MED ORDER — ONDANSETRON HCL 4 MG/2ML IJ SOLN
4.0000 mg | Freq: Once | INTRAMUSCULAR | Status: DC
  Filled 2017-05-29: qty 2

## 2017-05-29 MED ORDER — SODIUM CHLORIDE 0.9 % IV SOLN
1000.0000 mL | INTRAVENOUS | Status: DC
  Administered 2017-05-29 – 2017-05-31 (×3): 1000 mL via INTRAVENOUS

## 2017-05-29 MED ORDER — KETOROLAC TROMETHAMINE 15 MG/ML IJ SOLN
15.0000 mg | Freq: Once | INTRAMUSCULAR | Status: AC
  Administered 2017-05-29: 15 mg via INTRAVENOUS
  Filled 2017-05-29: qty 1

## 2017-05-29 MED ORDER — PIPERACILLIN-TAZOBACTAM 3.375 G IVPB 30 MIN
3.3750 g | Freq: Once | INTRAVENOUS | Status: AC
  Administered 2017-05-29: 3.375 g via INTRAVENOUS
  Filled 2017-05-29: qty 50

## 2017-05-29 MED ORDER — ONDANSETRON HCL 4 MG PO TABS: 4.0000 mg | ORAL_TABLET | Freq: Four times a day (QID) | ORAL | Status: DC | PRN

## 2017-05-29 MED ORDER — SODIUM CHLORIDE 0.9 % IV BOLUS (SEPSIS)
1000.0000 mL | Freq: Once | INTRAVENOUS | Status: AC
Start: 2017-05-29 — End: 2017-05-29
  Administered 2017-05-29: 1000 mL via INTRAVENOUS

## 2017-05-29 MED ORDER — VANCOMYCIN HCL 500 MG IV SOLR
500.0000 mg | INTRAVENOUS | Status: DC
  Administered 2017-05-30: 500 mg via INTRAVENOUS
  Filled 2017-05-29: qty 500

## 2017-05-29 MED ORDER — VANCOMYCIN HCL IN DEXTROSE 1-5 GM/200ML-% IV SOLN
1000.0000 mg | Freq: Once | INTRAVENOUS | Status: AC
  Administered 2017-05-29: 1000 mg via INTRAVENOUS
  Filled 2017-05-29: qty 200

## 2017-05-29 MED ORDER — VITAMIN K1 10 MG/ML IJ SOLN
5.0000 mg | Freq: Once | INTRAMUSCULAR | Status: AC
  Administered 2017-05-29: 5 mg via INTRAVENOUS
  Filled 2017-05-29: qty 0.5

## 2017-05-29 MED ORDER — ACETAMINOPHEN 325 MG PO TABS
650.0000 mg | ORAL_TABLET | Freq: Four times a day (QID) | ORAL | Status: DC | PRN
  Administered 2017-05-29 – 2017-05-31 (×7): 650 mg via ORAL
  Filled 2017-05-29 (×7): qty 2

## 2017-05-29 NOTE — ED Notes (Signed)
Sent Page to MD Margo AyeHall about pt's BP

## 2017-05-29 NOTE — ED Notes (Signed)
Attempted Report x1.   

## 2017-05-29 NOTE — Progress Notes (Addendum)
PHARMACY - PHYSICIAN COMMUNICATION CRITICAL VALUE ALERT - BLOOD CULTURE IDENTIFICATION (BCID)  Results for orders placed or performed during the hospital encounter of 05/29/17  Blood Culture ID Panel (Reflexed) (Collected: 05/29/2017  3:52 AM)  Result Value Ref Range   Enterococcus species NOT DETECTED NOT DETECTED   Listeria monocytogenes NOT DETECTED NOT DETECTED   Staphylococcus species NOT DETECTED NOT DETECTED   Staphylococcus aureus NOT DETECTED NOT DETECTED   Streptococcus species NOT DETECTED NOT DETECTED   Streptococcus agalactiae NOT DETECTED NOT DETECTED   Streptococcus pneumoniae NOT DETECTED NOT DETECTED   Streptococcus pyogenes NOT DETECTED NOT DETECTED   Acinetobacter baumannii NOT DETECTED NOT DETECTED   Enterobacteriaceae species DETECTED (A) NOT DETECTED   Enterobacter cloacae complex NOT DETECTED NOT DETECTED   Escherichia coli DETECTED (A) NOT DETECTED   Klebsiella oxytoca NOT DETECTED NOT DETECTED   Klebsiella pneumoniae NOT DETECTED NOT DETECTED   Proteus species NOT DETECTED NOT DETECTED   Serratia marcescens NOT DETECTED NOT DETECTED   Carbapenem resistance NOT DETECTED NOT DETECTED   Haemophilus influenzae NOT DETECTED NOT DETECTED   Neisseria meningitidis NOT DETECTED NOT DETECTED   Pseudomonas aeruginosa NOT DETECTED NOT DETECTED   Candida albicans NOT DETECTED NOT DETECTED   Candida glabrata NOT DETECTED NOT DETECTED   Candida krusei NOT DETECTED NOT DETECTED   Candida parapsilosis NOT DETECTED NOT DETECTED   Candida tropicalis NOT DETECTED NOT DETECTED   Blood culture growing gram negative rods in two different sets. BCID identified as E Coli. Urine culture collected without results yet. Started on vancomycin and zosyn for suspected sepsis, possibly related to intra-abdominal process.   Name of physician (or Provider) Contacted: Dr. Toniann FailKakrakandy  Changes to prescribed antibiotics required: Consider discontinuing vancomycin. Since concern for possible  intra-abdominal source, continue zosyn and de-escalate pending further work-up.   Girard CooterKimberly Perkins, PharmD Clinical Pharmacist  Pager: [phone] Phone: [phone] 05/29/2017  7:54 PM

## 2017-05-29 NOTE — Plan of Care (Signed)
Pt given OTD of Toradol for abdominal/hip pain. Vitals are trending towards normal with most recent BP 97 systolic; HR 125; O2 satting in high 90s. Will continue to monitor pt.   [place]  Nyrie Sigal, RN

## 2017-05-29 NOTE — ED Notes (Signed)
Admitting provider bedside 

## 2017-05-29 NOTE — Progress Notes (Signed)
Pharmacy Antibiotic Note  Tina BoerKathy A Haley is a 51 y.o. female admitted on 05/29/2017 with abdominal pain and distention. Pharmacy has been consulted for vancomycin and zosyn dosing for suspected sepsis.  Temp 97.5, WBC elevated at 23, and LA elevated at 3.65. Noted AKI: SCr 2.49 for estimated CrCl ~ 20-25 mL/min.   Vancomycin 1g IV x1 and Zosyn 3.375g IV x1 given in ED.   Plan: Vancomycin 500 mg IV q24hr  Zosyn 3.375g IV q8hr  Vancomycin trough at SS and PRN (goal 15-20 mcg/mL) Monitor renal function, clinical picture, and culture data  Height: 5\' 3"  (160 cm) Weight: 125 lb (56.7 kg) IBW/kg (Calculated) : 52.4  Temp (24hrs), Avg:97.5 F (36.4 C), Min:97.5 F (36.4 C), Max:97.5 F (36.4 C)  Recent Labs  Lab 05/28/17 2205 05/29/17 0415  WBC 23.0*  --   CREATININE 2.49*  --   LATICACIDVEN  --  3.65*    Estimated Creatinine Clearance: 22.1 mL/min (A) (by C-G formula based on SCr of 2.49 mg/dL (H)).    No Known Allergies  Antimicrobials this admission: 11/6 Vanc >>  11/6 Zosyn >>   Microbiology results: pending   Tina CrowKatherine Haley, PharmD Clinical Pharmacist 05/29/17 5:34 AM

## 2017-05-29 NOTE — ED Provider Notes (Signed)
TIME SEEN: 4:37 AM  CHIEF COMPLAINT: Abdominal pain and distention  HPI: Patient is a 51 year old female with history of alcoholic cirrhosis, esophageal varices who presents to the emergency department with abdominal pain, distention for the past several days.  She has had 5 previous paracentesis, last was October 25 with a drained approximately 5-6 L.  She reports that this is the fastest that her fluid is ever reaccumulated.  She has had nausea but no vomiting.  No diarrhea.  No fever.  No history of abdominal surgeries.  No previous history of SBP.  States that her meld score was 29 a week ago.  [facility].  Her GI doctor is Dr. Adela [facility]ankArmbruster with [facility].  She states that she has just started [facility] a [facility] for [facility] but is not yet on the liver transplant list.  ROS: See HPI Constitutional: no fever  Eyes: no drainage  ENT: no runny nose   Cardiovascular:  no chest pain  Resp: no SOB  GI: no vomiting GU: no dysuria Integumentary: no rash  Allergy: no hives  Musculoskeletal: no leg swelling  Neurological: no slurred speech ROS otherwise negative  PAST MEDICAL HISTORY/PAST SURGICAL HISTORY:  Past Medical History:  Diagnosis Date  . Alcohol abuse 06/2015  . Anemia 06/2015   due to ulcer bleeding, transfused 1 PRBC.    Marland Kitchen. Anxiety 06/2015   Panic attacks  . Ascites   . Chronic gastritis   . Chronic headaches 06/2015   RARE MIGRAINE  . Cirrhosis (HCC)   . Complication of anesthesia    11-30-15 ENDOSCOPY DID NOT GO WELL PAIN WITH MAY 2017 ENDOSCOPY WENT WELL PAIN FREE  . Esophageal varices (HCC)   . Fatty liver 06/2015  . GI bleed 06/2015, 10/2015   gastric ulcer per EGD dr [place]heimer [place]  . IDA (iron deficiency anemia)   . Splenomegaly     MEDICATIONS:  Prior to Admission medications   Medication Sig Start Date End Date Taking? Authorizing Provider  pantoprazole (PROTONIX) 40 MG tablet Take 1 tablet (40 mg  total) by mouth daily. 03/07/17   Armbruster, Willaim RayasSteven P, MD  Prenatal Vit-Fe Fumarate-FA (PRENATAL MULTIVITAMIN) TABS tablet Take 1 tablet by mouth daily.     [provider]  spironolactone (ALDACTONE) 100 MG tablet Take 1 tablet (100 mg total) by mouth daily. 03/27/17   Armbruster, Willaim RayasSteven P, MD    ALLERGIES:  No Known Allergies  SOCIAL HISTORY:  Social History   Tobacco Use  . Smoking status: Never Smoker  . Smokeless tobacco: Never Used  Substance Use Topics  . Alcohol use: No    Alcohol/week: 0.0 oz    Comment: as of November 03, 2015-has had no alcohol to drink    FAMILY HISTORY: Family History  Problem Relation Age of Onset  . Peptic Ulcer Father   . Peripheral Artery Disease Father   . Diabetes Mother   . Coronary artery disease Mother   . Heart disease Mother   . Diabetes Maternal Aunt   . Heart disease Maternal Grandfather   . Stomach cancer Neg Hx   . Colon cancer Neg Hx   . Rectal cancer Neg Hx   . Pancreatic cancer Neg Hx     EXAM: BP (!) 102/42   Pulse (!) 134   Temp (!) 97.5 F (36.4 C) (Rectal)   Resp 17   Ht 5\' 3"  (1.6 m)   Wt 56.7 kg (125 lb)  SpO2 100%   BMI 22.14 kg/m  CONSTITUTIONAL: Alert and oriented and responds appropriately to questions.  Chronically ill-appearing, jaundiced, thin HEAD: Normocephalic EYES:  pupils appear equal, EOMI, scleral icterus ENT: normal nose; moist mucous membranes NECK: Supple, no meningismus, no nuchal rigidity, no LAD  CARD: Regular and tachycardic; S1 and S2 appreciated; no murmurs, no clicks, no rubs, no gallops RESP: Normal chest excursion without splinting or tachypnea; breath sounds clear and equal bilaterally; no wheezes, no rhonchi, no rales, no hypoxia or respiratory distress, speaking full sentences ABD/GI: Normal bowel sounds; distended with fluid wave, soft, no significant tenderness on exam, no peritoneal signs, no rebound or guarding BACK:  The back appears normal and is non-tender to  palpation, there is no CVA tenderness EXT: Normal ROM in all joints; non-tender to palpation; no edema; normal capillary refill; no cyanosis, no calf tenderness or swelling    SKIN: Normal color for age and race; warm; no rash NEURO: Moves all extremities equally PSYCH: The patient's mood and manner are appropriate. Grooming and personal hygiene are appropriate.  MEDICAL DECISION MAKING: Patient here with worsening liver failure.  Her meld score is currently 38.  She is tachycardic and has slightly soft blood pressure but she reports this blood pressure is normal for her.  She is afebrile.  She does have a leukocytosis and possible UTI.  Blood cultures, urine culture have been sent.  We will give broad-spectrum antibiotics.  Patient has hyponatremia with sodium of 122, acute renal failure with creatinine of 2.49.  Her INR is 2.68.  Given her elevated INR I do not feel comfortable performing a paracentesis at the bedside and she is very apprehensive about this as well she states that often times her bowel is in the way that has to be performed under ultrasound by interventional radiology.  Patient will need admission.  She is comfortable with the plan to stay here given she is just started [facility].  Will give 30 mL/kg IV fluid bolus and vancomycin and Zosyn.  Will discuss with hospitalist.  ED PROGRESS:     4:43 AM Discussed patient's case with hospitalist, Dr. Toniann FailKakrakandy.  I have recommended admission and patient (and family if present) agree with this plan. Admitting physician will place admission orders.   I reviewed all nursing notes, vitals, pertinent previous records, EKGs, lab and urine results, imaging (as available).      EKG Interpretation  Date/Time:  Monday May 28 2017 21:57:54 EST Ventricular Rate:  138 PR Interval:  148 QRS Duration: 64 QT Interval:  290 QTC Calculation: 439 R Axis:   10 Text Interpretation:  Sinus tachycardia Possible Left atrial enlargement  Left ventricular hypertrophy Possible Inferior infarct , age undetermined Anterior infarct , age undetermined Abnormal ECG No old tracing to compare Confirmed by Jermar Colter, Baxter HireKristen [phone]([id number]) on 05/29/2017 4:37:50 AM        CRITICAL CARE Performed by: Raelyn NumberWARD, Braley Luckenbaugh N   Total critical care time: 55 minutes  Critical care time was exclusive of separately billable procedures and treating other patients.  Critical care was necessary to treat or prevent imminent or life-threatening deterioration.  Critical care was time spent personally by me on the following activities: development of treatment plan with patient and/or surrogate as well as nursing, discussions with consultants, evaluation of patient's response to treatment, examination of patient, obtaining history from patient or surrogate, ordering and performing treatments and interventions, ordering and review of laboratory studies, ordering and review of radiographic studies, pulse  oximetry and re-evaluation of patient's condition.    Mashawn Brazil, Layla Maw, DO 05/29/17 [phone]

## 2017-05-29 NOTE — ED Notes (Signed)
Phlebotomy at the bedside  

## 2017-05-29 NOTE — ED Notes (Signed)
Notified pharmacy for Albumin.

## 2017-05-29 NOTE — ED Notes (Signed)
Spoke with admitting over the phone about pt pain and request for medication. Offered pt rectal tylenol and she refused. Pt is currently NPO, cannot have oral tylenol. Admitting reports she will come down to meet with pt. Face to face about pain control. Offered for pt to sit on side of bed, pt declined. Updated pt about plan of care.

## 2017-05-29 NOTE — H&P (Signed)
History and Physical    Tina Haley ZOX:[id number] DOB: 09-06-1965 DOA: 05/29/2017  PCP: [name], Brian, MD  Patient coming from: Home.  Chief Complaint: Abdominal pain.  HPI: Tina BoerKathy A Haley is a 51 y.o. female with with history of alcoholic liver cirrhosis with esophageal varices status post banding a month ago also has been having recurrent paracentesis for ascites last one was 10 days ago presents with abdominal pain distention over the last 3 days with poor appetite and nausea.  Denies any fever chills chest pain or shortness of breath.  Patient states he has been taking his spironolactone but held off of Lasix as instructed by the physician.  Pain is mostly in the flank area patient states she has been making urine denies any diarrhea.  ED Course: In the ER patient is found to be tachycardic on exam patient has distended abdomen and bilateral lower extremity edema.  Patient remained afebrile.  Patient's INR is elevated at 2.8 and lactic acid was elevated at 3.6 following and the heart rate was in the 130s sinus tachycardia and creatinine was 2.4.  UA shows large amount of leukocyte esterase with squamous epithelial cells and too numerous to count WBC and few bacteria.  Blood cultures and urine cultures were obtained patient was started on IV fluids sepsis protocol due to renal failure and possible sepsis.  Patient was started on empiric antibiotics.  Review of Systems: As per HPI, rest all negative.   Past Medical History:  Diagnosis Date  . Alcohol abuse 06/2015  . Anemia 06/2015   due to ulcer bleeding, transfused 1 PRBC.    Marland Kitchen. Anxiety 06/2015   Panic attacks  . Ascites   . Chronic gastritis   . Chronic headaches 06/2015   RARE MIGRAINE  . Cirrhosis (HCC)   . Complication of anesthesia    11-30-15 ENDOSCOPY DID NOT GO WELL PAIN WITH MAY 2017 ENDOSCOPY WENT WELL PAIN FREE  . Esophageal varices (HCC)   . Fatty liver 06/2015  . GI bleed 06/2015, 10/2015   gastric ulcer per  [place] dr Bra[place]heimer [place]  . IDA (iron deficiency anemia)   . Splenomegaly     Past Surgical History:  Procedure Laterality Date  . IR PARACENTESIS  02/01/2017  . IR PARACENTESIS  02/19/2017  . IR PARACENTESIS  03/09/2017  . IR PARACENTESIS  04/06/2017  . IR PARACENTESIS  05/17/2017  . ORIF TIBIA & FIBULA FRACTURES       reports that  has never smoked. she has never used smokeless tobacco. She reports that she does not drink alcohol or use drugs.  No Known Allergies  Family History  Problem Relation Age of Onset  . Peptic Ulcer Father   . Peripheral Artery Disease Father   . Diabetes Mother   . Coronary artery disease Mother   . Heart disease Mother   . Diabetes Maternal Aunt   . Heart disease Maternal Grandfather   . Stomach cancer Neg Hx   . Colon cancer Neg Hx   . Rectal cancer Neg Hx   . Pancreatic cancer Neg Hx     Prior to Admission medications   Medication Sig Start Date End Date Taking? Authorizing Provider  furosemide (LASIX) 40 MG tablet Take 40 mg daily as needed by mouth for fluid.  04/23/17  Yes [provider]  pantoprazole (PROTONIX) 40 MG tablet Take 1 tablet (40 mg total) by mouth daily. 03/07/17  Yes Armbruster, Willaim RayasSteven P, MD  Prenatal Vit-Fe Fumarate-FA (PRENATAL  MULTIVITAMIN) TABS tablet Take 1 tablet by mouth daily.    Yes [provider]  spironolactone (ALDACTONE) 100 MG tablet Take 1 tablet (100 mg total) by mouth daily. 03/27/17  Yes Benancio Deeds, MD    Physical Exam: Vitals:   05/29/17 0415 05/29/17 0430 05/29/17 0445 05/29/17 0530  BP: (!) 99/52 (!) 101/49 102/67 (!) 102/46  Pulse: (!) 127 (!) 126 (!) 125 (!) 128  Resp:    13  Temp:      TempSrc:      SpO2: 100% 100% 100% 99%  Weight:      Height:          Constitutional: Moderately built and nourished. Vitals:   05/29/17 0415 05/29/17 0430 05/29/17 0445 05/29/17 0530  BP: (!) 99/52 (!) 101/49 102/67 (!) 102/46  Pulse: (!) 127 (!) 126 (!) 125 (!) 128    Resp:    13  Temp:      TempSrc:      SpO2: 100% 100% 100% 99%  Weight:      Height:       Eyes: Anicteric mild pallor. ENMT: No discharge from the ears eyes nose or mouth. Neck: No mass felt.  No JVD appreciated. Respiratory: No rhonchi or crepitations. Cardiovascular: S1-S2 heard tachycardic. Abdomen: Distended abdomen otherwise nontender.  Bowel sounds present. Musculoskeletal: Bilateral lower extremity edema. Skin: No rash.  Skin appears pale. Neurologic: Alert awake oriented to time place and person.  Moves all extremities. Psychiatric: Appears normal.  Normal affect.   Labs on Admission: I have personally reviewed following labs and imaging studies  CBC: Recent Labs  Lab 05/28/17 2205  WBC 23.0*  NEUTROABS 18.8*  HGB 9.6*  HCT 29.0*  MCV 116.0*  PLT 254   Basic Metabolic Panel: Recent Labs  Lab 05/28/17 2205  NA 122*  K 5.1  CL 88*  CO2 20*  GLUCOSE 108*  BUN 23*  CREATININE 2.49*  CALCIUM 9.0   GFR: Estimated Creatinine Clearance: 22.1 mL/min (A) (by C-G formula based on SCr of 2.49 mg/dL (H)). Liver Function Tests: Recent Labs  Lab 05/28/17 2205  AST 54*  ALT 23  ALKPHOS 91  BILITOT 10.8*  PROT 7.7  ALBUMIN 2.2*   Recent Labs  Lab 05/28/17 2205  LIPASE 22   No results for input(s): AMMONIA in the last 168 hours. Coagulation Profile: Recent Labs  Lab 05/29/17 0345  INR 2.86   Cardiac Enzymes: No results for input(s): CKTOTAL, CKMB, CKMBINDEX, TROPONINI in the last 168 hours. BNP (last 3 results) No results for input(s): PROBNP in the last 8760 hours. HbA1C: No results for input(s): HGBA1C in the last 72 hours. CBG: No results for input(s): GLUCAP in the last 168 hours. Lipid Profile: No results for input(s): CHOL, HDL, LDLCALC, TRIG, CHOLHDL, LDLDIRECT in the last 72 hours. Thyroid Function Tests: No results for input(s): TSH, T4TOTAL, FREET4, T3FREE, THYROIDAB in the last 72 hours. Anemia Panel: No results for input(s):  VITAMINB12, FOLATE, FERRITIN, TIBC, IRON, RETICCTPCT in the last 72 hours. Urine analysis:    Component Value Date/Time   COLORURINE AMBER (A) 05/29/2017 0254   APPEARANCEUR CLOUDY (A) 05/29/2017 0254   LABSPEC 1.025 05/29/2017 0254   PHURINE 5.0 05/29/2017 0254   GLUCOSEU NEGATIVE 05/29/2017 0254   HGBUR NEGATIVE 05/29/2017 0254   BILIRUBINUR NEGATIVE 05/29/2017 0254   KETONESUR NEGATIVE 05/29/2017 0254   PROTEINUR 30 (A) 05/29/2017 0254   NITRITE NEGATIVE 05/29/2017 0254   LEUKOCYTESUR LARGE (A) 05/29/2017 0254  Sepsis Labs: @LABRCNTIP (procalcitonin:4,lacticidven:4) )No results found for this or any previous visit (from the past 240 hour(s)).   Radiological Exams on Admission: Dg Chest Port 1 View  Result Date: 05/29/2017 CLINICAL DATA:  Shortness of breath, sepsis, and abdominal pain. EXAM: PORTABLE CHEST 1 VIEW COMPARISON:  01/07/2016 FINDINGS: Linear atelectasis in both lung bases. Heart size and pulmonary vascularity are normal. No consolidation in the lungs. No blunting of costophrenic angles. No pneumothorax. Mediastinal contours appear intact. IMPRESSION: Linear atelectasis in the lung bases.  No focal consolidation. Electronically Signed   By: Burman Nieves M.D.   On: 05/29/2017 06:29    EKG: Independently reviewed.  Sinus tachycardia.  Assessment/Plan Principal Problem:   SIRS (systemic inflammatory response syndrome) (HCC) Active Problems:   Esophageal varices in alcoholic cirrhosis (HCC)   ARF (acute renal failure) (HCC)   Anemia   Decompensated hepatic cirrhosis (HCC)   Hyponatremia    1. SIRS likely for possible cause could be intra-abdominal -once patient is stable will need to get paracentesis.  For now we will continue with vancomycin and Zosyn follow cultures.  Patient is receiving IV fluids since patient also has acute renal failure and possible sepsis.  Patient on exam does have fluid overload like features and should closely monitor for any respiratory  distress.  Follow lactate levels pro calcitonin levels.  CT of the abdomen and pelvis has been ordered. 2. Decompensated liver cirrhosis -will hold off patient's spironolactone due to acute renal failure and hyponatremia.  In any case patient has not been taking her Lasix.  Urine sodium and creatinine has been ordered.  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  Follow PT/INR will order vitamin K. 3. Acute renal failure -patient was mildly hypotensive on presentation.  Patient also states she has not been eating well the last few days.  Check FENa and closely follow intake output and metabolic panel.  Follow CT scan results. 4. Microcytic hypochromic anemia -which has worsened from recent.  Patient did not see any obvious bleed.  Follow CBC.  I have type and screen.  Patient has had recent EGD with banding.  If there is any further drop in hemoglobin patient probably will need EGD.  Follow CT scan results.  I have reviewed patient's old charts and labs.   DVT prophylaxis: SCDs. Code Status: Full code. Family Communication: Discussed with patient. Disposition Plan: Home. Consults called: None. Admission status: Inpatient stepdown.   Eduard Clos MD [facility] Pager [phone].  If 7PM-7AM, please contact night-coverage [email or web address] Password [facility]  05/29/2017, 6:47 AM

## 2017-05-29 NOTE — ED Notes (Signed)
Admitting MD made aware of patients BP. 

## 2017-05-29 NOTE — Progress Notes (Signed)
PROGRESS NOTE  Tina Haley:[id number]:[id number] DOB: [birth year] DOA: 05/29/2017 PCP: Tina Haley  HPI/Recap of past 24 hours: Hospitalization day#0. Patient seen and examined at her bedside in the ED. Hypotensive and tachycardic with 3+ pitting edema in lower extremities bilaterally. Reviewed cxr, no sign of pulmonary edema. Will give albumin and 500 cc IV bolus NS to improve MAP to at least close to 65, current MAP is 56. Broad IV antibiotics IV vanc and IV zosyn.  The writer called GI on call who deferred to the patient's [facility] which [facility]. Will continue to closely monitor and treat.  Assessment/Plan: Principal Problem:   SIRS (systemic inflammatory response syndrome) (HCC) Active Problems:   Esophageal varices in alcoholic cirrhosis (HCC)   ARF (acute renal failure) (HCC)   Anemia   Decompensated hepatic cirrhosis (HCC)   Hyponatremia   Code Status: Full  Objective: Vitals:   05/29/17 0645 05/29/17 0700 05/29/17 0730 05/29/17 0800  BP: (!) 112/56 (!) 106/56 (!) 103/49 (!) 102/45  Pulse: (!) 127 (!) 127 (!) 127 (!) 128  Resp: 14 18 18 17   Temp:      TempSrc:      SpO2: 99% 97% 96% 98%  Weight:      Height:       No intake or output data in the 24 hours ending 05/29/17 0907 Filed Weights   05/28/17 2155  Weight: 56.7 kg (125 lb)    Exam:   General:  Thin built uncomfortable due to back pain and severely distended abd  Cardiovascular: RRR no murmurs rubs or gallops  Respiratory: CTA no wheezes or rales  Abdomen: severely distended with fluid shift  Musculoskeletal: moves limbs freely  Skin: No open lesions  Psychiatry: Mood is anxious   Data Reviewed: CBC: Recent Labs  Lab 05/28/17 2205  WBC 23.0*  NEUTROABS 18.8*  HGB 9.6*  HCT 29.0*  MCV 116.0*  PLT 254   Basic Metabolic Panel: Recent Labs  Lab 05/28/17 2205  NA 122*  K 5.1  CL 88*  CO2 20*  GLUCOSE 108*  BUN 23*  CREATININE 2.49*  CALCIUM 9.0   GFR: Estimated Creatinine  Clearance: 22.1 mL/min (A) (by C-G formula based on SCr of 2.49 mg/dL (H)). Liver Function Tests: Recent Labs  Lab 05/28/17 2205  AST 54*  ALT 23  ALKPHOS 91  BILITOT 10.8*  PROT 7.7  ALBUMIN 2.2*   Recent Labs  Lab 05/28/17 2205  LIPASE 22   No results for input(s): AMMONIA in the last 168 hours. Coagulation Profile: Recent Labs  Lab 05/29/17 0345  INR 2.86   Cardiac Enzymes: No results for input(s): CKTOTAL, CKMB, CKMBINDEX, TROPONINI in the last 168 hours. BNP (last 3 results) No results for input(s): PROBNP in the last 8760 hours. HbA1C: No results for input(s): HGBA1C in the last 72 hours. CBG: Recent Labs  Lab 05/29/17 0807  GLUCAP 98   Lipid Profile: No results for input(s): CHOL, HDL, LDLCALC, TRIG, CHOLHDL, LDLDIRECT in the last 72 hours. Thyroid Function Tests: No results for input(s): TSH, T4TOTAL, FREET4, T3FREE, THYROIDAB in the last 72 hours. Anemia Panel: No results for input(s): VITAMINB12, FOLATE, FERRITIN, TIBC, IRON, RETICCTPCT in the last 72 hours. Urine analysis:    Component Value Date/Time   COLORURINE AMBER (A) 05/29/2017 0254   APPEARANCEUR CLOUDY (A) 05/29/2017 0254   LABSPEC 1.025 05/29/2017 0254   PHURINE 5.0 05/29/2017 0254   GLUCOSEU NEGATIVE 05/29/2017 0254   HGBUR NEGATIVE 05/29/2017 0254  BILIRUBINUR NEGATIVE 05/29/2017 0254   KETONESUR NEGATIVE 05/29/2017 0254   PROTEINUR 30 (A) 05/29/2017 0254   NITRITE NEGATIVE 05/29/2017 0254   LEUKOCYTESUR LARGE (A) 05/29/2017 0254   Sepsis Labs: @LABRCNTIP (procalcitonin:4,lacticidven:4)  )No results found for this or any previous visit (from the past 240 hour(s)).    Studies: Dg Chest Port 1 View  Result Date: 05/29/2017 CLINICAL DATA:  Shortness of breath, sepsis, and abdominal pain. EXAM: PORTABLE CHEST 1 VIEW COMPARISON:  01/07/2016 FINDINGS: Linear atelectasis in both lung bases. Heart size and pulmonary vascularity are normal. No consolidation in the lungs. No blunting of  costophrenic angles. No pneumothorax. Mediastinal contours appear intact. IMPRESSION: Linear atelectasis in the lung bases.  No focal consolidation. Electronically Signed   By: Burman NievesWilliam  Stevens M.D.   On: 05/29/2017 06:29    Scheduled Meds: . iopamidol        Continuous Infusions: . sodium chloride 1,000 mL (05/29/17 0539)  . albumin human    . phytonadione (VITAMIN K) IV 5 mg (05/29/17 0811)  . piperacillin-tazobactam (ZOSYN)  IV    . [START ON 05/30/2017] vancomycin       LOS: 0 days     Tina Droparole N Hall, Haley [facility] Pager [phone]  If 7PM-7AM, please contact night-coverage [email or web address] Password [email or web address] 05/29/2017, 9:07 AM

## 2017-05-29 NOTE — Progress Notes (Signed)
Hypoglycemic Event  CBG: 65  Treatment: orange juice x2  Symptoms: None  Follow-up CBG: Time: 15mins CBG Result:   Possible Reasons for Event:   Comments/MD notified: No   At shift change, next RN notified to check again in LakCheyenne County Hospitale Es(1324Barbaraann Sh40ArmaEditor, commImogene Burnssio541-49150387189WaynardUnited StatesOscar LKent31ucCarr530oShaLaver734tRenaye RakEndos107cTalmagePortland Va469-Ka eSMemorial HealtKentucky5409811HartfoCPamelia HoitarEnedinaZollGlen Endoscopy Center LLCie SKorePrudy65 FVick137457 5BalJeannett SenKentuckAugusto914782161Excell SA941610960Maxine 7821610960CaroliCarris Health LLC-Rice Memorial HospiJuel BurrowGilda CGardNicho1610First [place] Hos536644016JacTresa EnEx1610978FiKatrinka Blazin846KentucKentuckykyDaM#Q4Baiting HoExcell SeltzerPU1 09[facility] - CharltoKentucky4409811HartfoCPamelia HoitarEnedinaZollSt John Vianney31 CVick1350265 S.Jeannett SenKentuckAugusto914782161Excell SA181610960Maxine 7821610960CarolinChapman Medical CenJuel BurrowGilda CGardNicho1610Lodi Community Hos536644016JacTresa EnEx1610978SuffiKatrinka Blazin84Kentucky6William HaRevKentuckyonDaM#Q4SabExcell SeltzU1 09SuKentucky409811HartfoCPamelia HoitarEnedinaZollAdventist Medical Center - Reed63leVick1373267Jeannett SenKentuckAugusto914782161Excell SA371610960Maxine 7821610960CarolinUniversity Of Toledo Medical CenJuel BurrowGilda CGardNicho1610Cumberland River Hos536644016JacTresa EnEx1610978SKatrinka Blazin8469Molly19KentucKentuckykyDaM#Q4FoExcell SeltzerDU1 09Spectrum HeaKentucky74098HartfoCPamelia HoitarEnedinaZollHoly Cross Germantown Hospi73taVick3538Jeannett SenKentuckAugusto914782161Excell SA211610960Maxine 7821610960CarolinNortheast Ohio Surgery Center Juel BurrowGilda CGardNicho1610Mountain Home Va Medical C536644016JacTresa EnEx1Katrinka Blazin8469MolKentuckKentuckyylDaM#Q4ThoExcell SeltzerNorth LU1L2409811914sanRevonda 4Frances Furbi52msh611Edward JollyNew York Methodist Hos161096167428 G>ulatory Surg46e69624VinceJ lyCommunity [facility], [place]1324Barbaraann Sh40ArmaEditor, commImogene Burnssio734 2313647161WaynardUnited StatesOscar LKent86ucCarr5077oShaLaver4292tRenaye RakThe Physicians'45 TalmageApple(251)KathleSouth DR691KoreaKorea37952JAmerican Financ18i1 Summer StToneIle91 SheffiAlanso80Upper Cumberland Physicians Surg24e69624VinceJ lyRegions HospitalB1324Barbaraann Sh40ArmaEditor, commImogene Burnssio785-0506700176WaynardUnited StatesOscar LKent45ucCarr21oShaLaver5622tRenaye RakMs Ba67pTalmageCenter For Advanced346-KathleSouth DR64KoreaKorea79952JAmerican Financ88i[address]ToneIle8540 RichAlanso72 N. GSaint Cl62a69624VinceJ<MEASURScottsdale Liberty HospitalE1324Barbaraann Sh40ArmaEditor, commImogene Burnssio773-2112343172WaynardUnited StatesOscar LKent22ucCarr5920oShaLaver2159tRenaye RakSisters27 TalmageRiverbridge Spe(514)KathleSouth DR647KoreaKorea27952JAmerican Financ61i9093 Country Club DToneIle23Alanso8229 WePhiladeLPhia Su49r69624VinceJ lySaint ALPhonsus MYOsborne County Memorial Hospitalanke1324Barbaraann Sh40ArmaEditor, commImogene Burnssio925-45618165WaynardUnited StatesOscar LKent61ucCarr5137oShaLaver270tRenaye RakSaint Joseph Hos1pTalmagePresence Chicago Hospitals Network Dba Presence Saint [facility] Nazareth 808-KathleSouth DR673KoreaKorea31952JAmerican Financ62i8768 Ridge ToneIle36 SwAlanso8118 South La Liga Ba19p69624VinceJ lyEastern New Mexico Medical Centerost1324Barbaraann Sh40ArmaEditor, commImogene Burnssio819-3210132161WaynardUnited StatesOscar LKent22ucCarr3362oShaLaver438tRenaye RakCountrysid71eTalmageValley Medical Plaza251-KathleSouth DR633KoreaKorea36952JAmerican Financ63i[address]2GreToneIle7351 PilgAlanso836Alicia 4S69624VinceJ lyEndoscopy CentFoCarmel Ambulatory Surgery Center LLCres1324Barbaraann Sh40ArmaEditor, commImogene Burnssio425807650169WaynardUnited StatesOscar LKent67ucCarr3075oShaLaver5032tRenaye R12aTalmageCarrus Rehabili405-KathleSouth DR666KoreaKorea109952JAmerican Financ33i421 Newbridge LaneToneIle734 NorthAlanso7583 ILakeview Center - Psychi47a69624VinceJ<MEASUREMENMercy HospitalT>l1324Barbaraann Sh40ArmaEditor, commImogene Burnssio507-526374158WaynardUnited StatesOscar LKent26ucCarr3757oShaLaver62108tRenaye RakDayt82oTalmageCheyenne 575-KathleSouth DR641KoreaKorea67952JAmerican Financ41i84 W. [place] DToneIle442 ChestAlanso67 LiPhysicians Surgery Center Of Chattanooga LLC Dba Physicians Surgery Center 15O69624VinceJ lyNovant Hospital CharlLake Charles Memorial HospitaloGo1324Barbaraann Sh40ArmaEditor, commImogene Burnssio365-51142138WaynardUnited StatesOscar LKent62ucCarr2855oShaLaver664tRenaye RakOklahoma Ci68tTalmageVidant223-KathleSouth DR62KoreaKorea31952JAmerican Financ29i[address]6DrexelToneIle7 CourAlanso22Valley Forge Medical Cen26t69624VinceJ Upmc Carlislel1324Barbaraann Sh40ArmaEditor, commImogene Burnssio351-59132116WaynardUnited StatesOscar LKent90ucCarr4745oShaLaver971tRenaye RakHa52nTalmageBristol Regional682-KathleSouth DR639KoreaKorea2952JAmerican Financ[address]71ToneIle256 PiAlanso811Wesley Long Comm25u69624VinceJ<MEASUREMENLoHosp San Cristobalsan1324Barbaraann Sh40ArmaEditor, commImogene Burnssio(980)671716139WaynardUnited StatesOscar LKent73ucCarr5957oShaLaver8175tRenaye RakCamc Women And36 TalmageNorthwest Medical Center - Willow Creek W502-KathleSouth DR667KoreaKorea69952JAmerican Financ797164 Stillwater StreetaMcSheToneIle347 LivingAlanso4Ascension [facility] Wichita25 69624VinceJ lyValley SCataract Institute Of Oklahoma LLCweet(1324Barbaraann Sh40ArmaEditor, commImogene Burnssio251-72876114WaynardUnited StatesOscar LKent23ucCarr4272oShaLaver676tRenaye RakChesapeake 48STalmageCharleston E925 KathleSouth DR652KoreaKorea80952JAmerican Financ[address]1HToneIle21 NAlanso373 [facility] Orthopedic 19S69624VinceJ lyHudson County MeadowviHawaii State HospitalBelv1324Barbaraann Sh40ArmaEditor, commImogene Burnssio928-55173590138WaynardUnited StatesOscar LKent28ucCarr8359oShaLaver269tRenaye RakCarilion New River 32VTalmageNor e(Johnson Kentuck409811HartfoCPamelia HoitarEnedinaZollK Hovnanian Childrens Hospitalie SKor68ePVick13572 So1uthJeannett SenKentuckAugusto914782161Excell SA301610960Maxine 7821610960CaroliSt. Vincent'S BirmingJuel BurrowGilda CGardNicho1610Southwest Healthcare System-Mur536644016JacTresa EnEx16Katrinka Blazin8469Molly9549 WKentuckyKentuckyeWDaM#Q4InnsbExcell SeltzeU1L2409811914ganevonda 4Frances Furbi69msh571Edward JollyPortsmouth Regional Hos161096162270 G>y County Mem42o69624VinceJ lyThe Medical Center Of SoutheasSlatCarolinas Rehabilitatione S1324Barbaraann Sh40ArmaEditor, commImogene Burnssio267-2682763137WaynardUnited StatesOscar LKent62ucCarr523oShaLaver6854tRenaye RakAdena Green9fTalmageThe Surgery Center Of Alta Bates Summit Med215-KathleSouth DR658KoreaKorea33952JAmerican Financ49i[address]ToneIle9653 LoAlanso76 EaEast Alabama 17M69624VinceJ lyMainegeneraNCrescent Medical Center Lancasteruna1324Barbaraann Sh40ArmaEditor, commImogene Burnssio(813)79125717169WaynardUnited StatesOscar LKent57ucCarr3786oShaLaver3040tRenaye RakAce Endoscop56yTalmageAdventist Healthcare Behavioral He(351) KathleSouth DR620KoreaKorea6952JAmerican Financ71i[address]6HeidToneIle2 EdgAlanso931Gastrodiagnostics A Medical Group Dba United Surgery68 69624VinceJ lyWooster Milltown SpeciBSutter Bay Medical Foundation Dba Surgery Center Los Altosloom1324Barbaraann Sh40ArmaEditor, commImogene Burnssio865 3546422156WaynardUnited StatesOscar LKent47ucCarr4260oShaLaver2498tRenaye RakHallandale Outpatien60tTalmageJohnson Regional48KathleSouth DR648KoreaKorea40952JAmerican Financ23ia9440 E. San Juan DToneIle944 LAlanso8280 Ad[place] County Mem59o69624VinceJ lyChristus DubuisPWestmoreland Asc LLC Dba Apex Surgical Centereac1324Barbaraann Sh40ArmaEditor, commImogene Burnssio(661) 4644693111WaynardUnited StatesOscar LKent4ucCarr7567oShaLaver6733tRenaye RakOur Lady Of The Lake Reg30iTalmageIntegris Bass Baptis(506)KathleSouth DR66KoreaKorea 52Texas Health Heart & VascularKentucky7409811HartfoCPamelia HoitarEnedinaZollMonroeville Ambulatory Surgery Center LLCie SKorePrudy F45eeVick1375575 WiJeannett SenKentucAugusto914782161Excell SA751610960Maxine 7821610960CarolinGi Wellness Center Of Frederick Juel BurrowGilda CGardNicho1610Washington County Hos536644016JacTresa EnEx161097Katrinka Blazin8469MoKentuckylKentuckyWiDaM#Q4PondsvExcell SeltzU1L2409811914xanvonda 4Frances Furbi54msh551Edward JollyDixie Regional Medical C161096162663 G>nter A Medic32a69624VinceJ lyMaWesley Rehabilitation Hospitaline1324Barbaraann Sh40ArmaEditor, commImogene Burnssio647-363550121WaynardUnited StatesOscar LKent36ucCarr1269oShaLaver9258tRenaye RakHalifax Health Medical37 TalmageSutter Solano(909)KathleSouth DR654KoreaKorea50952JAmerican Financ61i9460 Newbridge StreeToneIle8218 BrickyAlanso7Horiz52o69624VinceJ lyMNorthridge Surgery CenteridS1324Barbaraann Sh40ArmaEditor, commImogene Burnssio516-77925256151WaynardUnited StatesOscar LKent35ucCarr6837oShaLaver7962tRenaye RakDrexel Town 48STalmageCommunity Ho 77Mayo Clinic Health [place]409811HartfoCPamelia HoitarEnedinaZollJps Health Network - Trinity Springs Northie SKorePrudy Feeler(76Renee 76RaVick139771 W. 27WilJeannett SenKentuckAugusto914782161Excell SA241610960Maxine 7821610960CaroliBeacham Memorial HospiJuel BurrowGilda CGardNicho1610Novamed Eye Surgery Center Of Overland Par536644016JacTresa EnEx1610978New BKatrinka Blazin8469Molly5KentuckKentuckyy0DaM#Q4EExcell SeltzerWU1 09Encompass Health Rehab HoKentucky6409811HartfoCPamelia HoitarEnedinaZollMountain West Surgery Cente72r Vick7937Jeannett SenKentuckAugusto914782161Excell SA1011610960Maxine 7821610960CaroliHarmony Surgery Center Juel BurrowGilda CGardNicho1610Hershey Outpatient Surgery Cent536644016JacTresa EnEx1610978WKatrinka Blazin8469Molly77Kentucky William HaKentuckyReDaM#Q4CarExcell SeltzeU1 09Kinston MedKentucky6409811(8HartfoCPamelia HoitarEnedinaZollSt. Mary'S Regional Medical Centerie SKorePrudy 58FeVick1382521 PaJeannett SenKentuckAugusto914782161Excell SA731610960Maxine 7821610960CaroliMarion Eye Surgery Center Juel BurrowGilda CGardNicho1610B[facility] Surgery C536644016JacTresa EnEx1610Katrinka Blazin846Kentucky9William HaReKentuckyvoDaM#Q4ConesvExcell SeltzeU1L2409811914danes Furbi16msh801Edward JollyLawnwood Regional Medical Center & 16109616(9053 G>nt Michaels 51M69624VinceJ lyMcgeeNew GrHomestead Hospitaland 1324Barbaraann Sh40ArmaEditor, commImogene Burnssio216-69601423175WaynardUnited StatesOscar LKent50ucCarr5725oShaLaver2026tRenaye RakLa Pee87rTalma omEast CampusKentucky6409811HartfoCPamelia HoitarEnedinaZollBaylor St Lukes Medical Center - Mcnair Campusie SKorePrudy Feeler8Re28neVick76374Jeannett SenKentuckAugusto914782161Excell SA511610960Maxine 7821610960CarolinaKernersville Medical CenterJuel BurrowGilda CGardNicho1610Witham Health Ser536644016JacTresa EnEx161097Katrinka Blazin8469Molly868 WesKentuckytWilliaKentuckym DaM#Q4Las PaExcell SeltzeU1L2409811914ean4Frances Furbi21msh801Edward JollyVa Medical Center - Albany Str16109616(846 G>0Mcpeak Surg60e69624VinceJ lyBellinLinSurgical Center Of Isleta Village Proper Countycoln1324Barbaraann Sh40ArmaEditor, commImogene Burnssio939-67562397145WaynardUnited StatesOscar LKent59ucCarr643oShaLaver7757tRenaye Ra45kTalmageMonadnock Com(709 atTrihealth EvenKentucky1409811HartfoCPamelia HoitarEnedinaZollProvidence Valdez Medical Centerie SK61orVick1370717 EJeannett SenKentuckAugusto914782161Excell SA591610960Maxine 7821610960CaroJefferson Endoscopy Center At BJuel BurrowGilda CGardNicho1610Southern Bone And Joint As536644016JacTresa EnEx161Katrinka Blazin8469Molly77Kentucky0William HaRKentuckyevDaM#Q4St. JoExcell SeltU1L2409811914eances Furbi82msh741Edward JollySaint ALPhonsus Medical Center - On161096162398 G>n Valley Hea15l69624VinceJ<MEASUREMTuscan Surgery Center At Las Colinas1324BarbaraaEditor, commImogene Bu628rOsc24arCarr3063Laver707Chambers 580679KoreaKorea5898 [address] Av8125 LexiAlan30sJ lyCapital DistHRaritan Bay Medical Center - Perth Amboyaye1324Barbaraann Sh40ArmaEditor, commImogene Burnssio(502) 500765126WaynardUnited StatesOscar LKent45ucCarr6143oShaLaver935tRenaye RakCascade 76ETalmageCedar(817) KathleSo DSouth Rommel Hogston EKentucky7409811HartfoCPamelia HoitarEnedinaZollAdvanced Vision Surgery Center LLCie25 SVick5937Jeannett SenKentuckAugusto914782161Excell SA361610960Maxine 7821610960CaroliFulton State HospiJuel BurrowGilda CGardNicho1610Portland Va Medical C536644016JacTresa EnEx161097Katrinka Blazin8469MoKentuckylWilKentuckyliDaM#Q4FalExcell SeltzerBairoa La VeiU1L2409811914oanda 4Frances Furbi27msh591Edward JollyJohn C Stennis Memorial Hos161096168365 G> Center Of C53h69624VinceJ lyMobridge RDayton Children'S HospitalegiD1324Barbaraann Sh40ArmaEditor, commImogene Burnssio619-7773158144WaynardUnited StatesOscar LKent70ucCarr637oShaLaver57tRenaye RakNorth Shore Medical C46eTalmageFreedom Vision Su(580) KathleSouth DR654KoreaKorea4952JAmerican Financ[address]5SportsmToneIle88AlansMason Ge75n69624VinceJ lySouthernUnBaylor Scott & White Medical Center At Waxahachieion1324Barbaraann Sh40ArmaEditor, commImogene Burnssio(360)761612136WaynardUnited StatesOscar LKent3ucCarr4779oShaLaver826tRenaye RakQuillen Reh82aTalmageBapti MeRancho MiKentucky6409811HartfoCPamelia HoitarEnedinaZollGreene County General Hospitalie 48SKVick136 East 80RocJeannett SenKentuckAugusto914782161Excell SA351610960Maxine 7821610960CaroliBoice Willis CliJuel BurrowGilda CGardNicho1610Conway Regional Medical C536644016JacTresa EnEx16109Katrinka Blazin8469MKentuckyoWiKentuckyllDaM#Q4Inman MExcell SelU1 09Springwoods BehavioKentucky6409811HartfoCPamelia HoitarEnedinaZollDenver West Endoscopy Center LLCie SKor2ePVick24334Jeannett SenKentuckAugusto914782161Excell SA511610960Maxine 7821610960CaroGreene County General HospiJuel BurrowGilda CGardNicho1610Mason City Ambulatory Surgery Cente536644016JacTresa EnEx161097Katrinka Blazin8469MoKentuckylWilliam HaRevonKentuckydaDaM#Q4AExcell SeltzU1L2409811914eanFurbi68msh841Edward JollyRehabilitation Institute Of Northwest Fl16109616810 G>egions Behav10i69624VinceJ lyGeorgeHendeTitusville Area Hospitalrso1324Barbaraann Sh40ArmaEditor, commImogene Burnssio810-362650155WaynardUnited StatesOscar LKent7ucCarr483oShaLaver6453tRenaye RakNorth Carol43iTalmageNeuropsychiatric Hospital Of In(614)KathleSouth DR643KoreaKorea68952JAmerican Financ[address]ToneIle52 AuAlansoRound Rock Surg45e69624VinceJ<MEASUCrestwood San Jose Psychiatric Health FacilityREM1324Barbaraann Sh40ArmaEditor, commImogene Burnssio514-5782332519WaynardUnited StatesOscar LKent10ucCarr5866oShaLaver654tRenay52eTalmageMarshfield Medical C(475) KathleSo DNortKentucky44098HartfoCPamelia HoitarEnedinaZollMonadnock Community Hospitalie 31SKVick134284 AJeannett SenKentuckAugusto914782161Excell SA831610960Maxine 7821610960CaroliLegacy Meridian Park Medical CenJuel BurrowGilda CGardNicho1610Providence Va Medical C536644016JacTresa EnEx1610978Katrinka Blazin8469Molly880Kentucky6WiKentuckyllDaM#Q4MinneExcell SeltzerCentU1 09Eye SurgeKentucky9409811HartfoCPamelia HoitarEnedinaZollRose Ambulatory Surgery Center LPie SKorePrud34y Vick138019 So23uthJeannett SenKentuckAugusto914782161Excell SA801610960Maxine 7821610960CaroBaylor Scott & White Medical Center - CentennJuel BurrowGilda CGardNicho1610Magn[facility] 536644016JacTresa EnEx1610978Katrinka Blazin8469MKentuckyoWillKentuckyiaDaM#Q4MaExcell SeltzerFort U1 09Mount DesKentuck409811HartfoCPamelia HoitarEnedinaZollThe Endoscopy Center Consultants In Gastroenterologyie SKorePr35udVick1319259 FJeannett SenKentucAugusto914782161Excell SA761610960Maxine 7821610960CaroLee'S Summit Medical CenJuel BurrowGilda CGardNicho1610St. Tamm[facility] Parish Hos536644016JacTresa EnEx161097Katrinka Blazin8469MollyKentuckyWilliam HaRevondaKentucky 4DaM#Q4Bedford HExcell SeltzeU1L2409811914danrbi43msh161Edward JollyInova Ambulatory Surgery Center At Lorto16109616(6523 G>Digestive En15d69624VinceJ<MEASUREMENTEncompass Health Rehabilitation Hospital Of Kingsport>lBr(1324Barbaraann Sh40ArmaEditor, commImogene Burnssio331-676525160WaynardUnited StatesOscar LKent62ucCarr760oShaLaver7866tRenaye RakAncora 50PTalmageMemorial H979-KathleSou DRVa Maine HealKentucky6409811(2HartfoCPamelia HoitarEnedinaZollJewish Hospital Shelbyvilleie SKorePr26udVick139925 Sou59th Jeannett SenKentuckAugusto914782161Excell SA641610960Maxine 7821610960CarolinaEast Ms State HospiJuel BurrowGilda CGardNicho1610Calvert Digestive Disease Associates Endoscopy And Surgery Cente536644016JacTresa EnEx1610978Katrinka Blazin8469Molly7KentucKentuckykyDaM#Q4MontExcell SeltzerEagletonU1L2409811914eanRevonda 4Frances Furbi52msh631Edward JollyTruxtun Surgery Cente16109616(7361 G>th [place] 85M69624VinceJ lyMidmichigan Medical Center-GladwinUche1324Barbaraann Sh40ArmaEditor, commImogene Burnssio215-6803440129WaynardUnited StatesOscar LKent33ucCarr6515oShaLaver3739tRenaye RakBroa27dTalmageEye Surgery Center Of No(626)KathleSouth DR624KoreaKorea83952JAmerican Financ57i630 Hudson LanToneIle2 GleAlanso84Cataract And Laser Center73 69624VinceJ lyWeFirst Hill Surgery Center LLC1324Barbaraann Sh40ArmaEditor, commImogene Burnssio760-746543117WaynardUnited StatesOscar LKent46ucCarr6375oShaLaver6250tRenaye RakNorthwood Dea54cTalmageMercer County Sur34KathleSouth DR681KoreaKo 17Rome OrthopaKentucky2409811HartfoCPamelia HoitarEnedinaZollHosp Oncologico Dr Isaac Gonzalez Martinezie SKorePr49udVick136980Jeannett SenKentuck1Augusto914782161Excell SA221610960Maxine 7821610960CarolinCypress Grove Behavioral Health Juel BurrowGilda CGardNicho1610Arkansas Continued Care Hospital Of Jone536644016JacTresa EnEx16109Katrinka Blazin8469MKKentuckyenDaM#Q4St. MExcell SeltzerWeU1L2409811914naniam HaRevonda 4Frances Furbi22msh521Edward JollyGuidance Center16109616(5375 G>n Management63 69624VinceJ lyThree Rivers HospitalISt.(1324Barbaraann Sh40ArmaEditor, commImogene Burnssio803-2436443198WaynardUnited StatesOscar LKent30ucCarr5966oShaLaver2231tRenaye RakHospi34tTalmageAscensi419-KathleSouth DR662KoreaKorea31952JAmerican Financ23i[address]ToneIle49AlansoGrossmont Sur74g69624VinceJ lyHPSt Joseph Memorial Hospitalo1324Barbaraann Sh40ArmaEditor, commImogene Burnssio(605) 47623162WaynardUnited StatesOscar LKent32ucCarr907oShaLaver73tRenaye RakJewish 71HTalmageCenter For21Quad Ci60tyVickKentuck20yHe161Excell Selt16Maxine GlenCarolina SiGilda Cre16JKentuckyacDarU1 09Grace HoKentucky409811(HartfoCPamelia HoitarEnedinaZollCape Fear Valley - Bladen County Hospitalie SKorePru32dyVick3639Jeannett SenKentuckAugusto914782161Excell SA131610960Maxine 7821610960CarolinFair Park Surgery CenJuel BurrowGilda CGardNicho1610Riverpointe Surgery C536644016JacTresa EnEx1610978Katrinka Blazin8469Molly8KentuckyKentuckyWiDaM#Q4St. VinExcell SeltzerWest BU1 09The RKentucky4409811(HartfoCPamelia HoitarEnedinaZollBaker Eye Instituteie SKorePrudy Feeler8Renee Ra57musnDu31ffVi49ck1Jeannett SenKentuckAugusto914782161Excell SA351610960Maxine 7821610960CarolinNorth Hali Balgobin Eye Surgery CenJuel BurrowGilda CGardNicho1610Central State Hos536644016JacTresa EnEx1610978Katrinka Blazin8Kentucky4William HaRevonda 4KentuckyFrDaM#Q4MorrisExcell SeltzU1L2409811914yani60msh581Edward JollySan Ramon Regional Medical Center South Bui161096166798 G>e County Mem22o69624VinceJ<MEASUREMENAlaska Native Medical Center - AnmcT>lP1324Barbaraann Sh40ArmaEditor, commImogene Burnssio(619) 151673177WaynardUnited StatesOscar LKent86ucCarr241oShaLaver5825tRenaye RakProvidence Little Company Of Mary Tran82sTalmageHollywood Presbyterian917-KathleSouth DR629KoreaKorea50952JAmerican Financ23i[address]4PToneIle8268 CobblAlansTeton Vall5e69624VinceJ lyRoseville Surgery CenterMid1324Barbaraann Sh40ArmaEditor, commImogene Burnssio207-57435743114WaynardUnited StatesOscar LKent59ucCarr5228oShaLaver2956tRenaye RakTrihealth Sur57gTalmageWillow662-KathleSouth DR679KoreaKorea63952JAmerican Financ[address]4ToneIle693 High PoAlanso921Hendrick 78M69624VinceJ<MEACriPiedmont Rockdale Hospitalder1324Barbaraann Sh40ArmaEditor, commImogene Burnssio423-05553595152WaynardUnited StatesOscar LKent21ucCarr5028oShaLaver5915tRenaye RakBay Par46kT(401)KathleSouth DR666KoreaKorea9952JAmerican Financ17i[address]5SadsbToneIle10AlanUc Regents Dba Ucla Health Pain Management35 69624VinceJ lyAurora Advanced Healthcare NortFreDundy County Hospitalnch 1324Barbaraann Sh40ArmaEditor, commImogene Burnssio25035121152WaynardUnited StatesOscar LKent35ucCarr5245oShaLaver1966tRenaye RakAncho81rTalmageS531 [address]South [address]637KoreaKorea29952JAmerican Financ78i752 Pheasant AveToneIle7685 TemAlanso829 CaAmbulatory Urology Surgi36c69624VinceJ lyGPremier Surgery Center Of Santa MariaaAt1324Barbaraann Sh40ArmaEditor, commImogene Burnssio2103557715WaynardUnited StatesOscar LKent2ucCarr3139oShaLaver5835tRenaye RakNorton87 TalmageGeary Com(316)KathleSouth DR621KoreaKorea6495 meAdvanced Endoscopy Kentucky409811(HartfoCPamelia HoitarEnedinaZollCovington - Amg Rehabilitation Hospitalie 44SKVick1391 30HigJeannett SenKentuckAugusto914782161Excell SA681610960Maxine 7821610960CarolinaVa Puget Sound Health Care System - American Lake DivisJuel BurrowGilda CGardNicho1610Select Rehabilitation Hospital Of D536644016JacTresa EnEx1610978FKatrinka Blazin8469KKentuckyenDaM#Q4PiExcell SeltzerFU1L2409811914taniam HaRevonda 4Frances Furbi61msh741Edward JollyWilton Surgery C161096165730 G>a Underwood 42S69624VinceJ lyRSilver Spring Ophthalmology LLCosB1324Barbaraann Sh40ArmaEditor, commImogene Burnssio847-66210148WaynardUnited StatesOscar LKent53ucCarr4738oShaLaver7663tRenaye RakChristus Mother Frances Ho43sTalmageMartin Army Com432-K leThe Eye Surgery Center Of Kentuck7409811HartfoCPamelia HoitarEnedinaZollBeltway Surgery Centers LLC Dba East Washington Surgery Centerie 81SKVick13888857 WeJeannett SenKentuckAugusto914782161Excell SA581610960Maxine 7821610960CaroliLafayette General Endoscopy Center Juel BurrowGilda CGardNicho1610Tupelo Surgery Cente536644016JacTresa EnEx16109Katrinka Blazin8469MoKentuckKentuckyylDaM#Q4TrimounExcell SelU1L2409811914nanRevonda 4Frances Furbi79msh301Edward JollyValley Endoscopy Cente161096168416 G>s Surgery An52d69624VinceJ lyPenn PresMouProwers Medical Centernt 1324Barbaraann Sh40ArmaEditor, commImogene Burnssio951-77385535137WaynardUnited StatesOscar LKent63ucCarr7174oShaLaver6650tRenaye RakCleveland Clinic Rehabili70tTalmageHa a Seton SKentucky409811HartfoCPamelia HoitarEnedinaZollVa Middle [facility] Healthcare Systemie SKorePrud45y Vick1368 3LakJeannett [id number] SA541610960Maxine [id number] Va Medical CenJuel BurrowGilda CGardNicho1610[facility] In536644016JacTresa EnEx1610978SaintKatrinka Blazin8469MollKentuckyKentuckyyWDaM#Q4Excell SeltzerPine KnolU1L2409811914sanevonda 4Frances Furbi40msh781Edward JollyGrossmont Surgery Cent16109616(5683 G>he Memorial 25H69624VinceJ lyHernando EndosBaylor Institute For Rehabilitation At Northwest DallasEll1324Barbaraann Sh40ArmaEditor, commImogene Burnssio201-5763341128WaynardUnited StatesOscar LKent48ucCarr7028oShaLaver3471tRenaye RakComprehensiv80eTalmageNorth Alabama Re(415)KathleSouth DR639KoreaKorea46952JAmerican Financ[address]3ToneIle8981 SheffiAlanso8446L[place] Comm42u69624VinceJ Ely Bloomenson Comm Hospitall1324Barbaraann Sh40ArmaEditor, commImogene Burnssio3346390555177WaynardUnited StatesOscar LKent58ucCarr367oShaLaver5057tRenaye RakLakelan50dTalmageAcute And Chronic Pain Manag516-KathleSouth DR64KoreaKorea64952JAmerican Financ20i334 [facility] DriToneIle12 FairfAlanso7725 RiSouth Big Horn County Critical A5c69624VinceJ lyBayhealtAvenuMorganton Eye Physicians Pae1324Barbaraann Sh40ArmaEditor, commImogene Burnssio224-2153315110WaynardUnited StatesOscar LKent81ucCarr7918oShaLaver751tRenaye RakAdv60eTalmageCorvallis Clinic Pc Dba e University Of California IrKentucky7409811HartfoCPamelia HoitarEnedinaZollCandescent Eye Health Surgicenter LLCie SKore64PrVick13708613 AJeannett SenKentuckAugusto914782161Excell SA331610960Maxine 7821610960CaroliWest Valley HospiJuel BurrowGilda CGardNicho1610Asc Tc536644016JacTresa EnEx161Katrinka Blazin8469MolKentuckyKentuckylWDaM#Q4BunnlExcell SeltzerShepaU1L2409811914eanevonda 4Frances Furbi32msh611Edward JollyPark Hill Surgery Cente161096168877 G>oCec Surgica41l69624VinceJ<MEASUCambridge Behavorial HospitalHil1324Barbaraann Sh40ArmaEditor, commImogene Burnssio337 [id number] StatesOscar LKent75ucCarr5124oShaLaver3653tRenaye RakMichigan Outpatien22tTalmagePiedmont Columbus R340-KathleSouth D [address] Surgery [place]4409811(HartfoCPamelia HoitarEnedinaZollAdvanced Eye Surgery Center P94aiVick13712924 GrJeannett SenKentuckAugusto914782161Excell SA421610960Maxine 7821610960CarolinaOchsner Lsu Health ShrevepJuel BurrowGilda CGardNicho1610Atlanta Endoscopy C536644016JacTresa EnEx1610Katrinka Blazin8469MolKentuckylWilliKentuckyamDaM#Q4SharpsExcell SeltzerWhaU1L2409811914ean 4Frances Furbi15msh721Edward JollyEdwin Shaw Rehabilitation Inst16109616565 G>Thomas River68 69624VinceJ lyMissSt Josephs Community Hospital Of [facility] IncioO1324Barbaraann Sh40ArmaEditor, commImogene Burnssio405-66472325183WaynardUnited StatesOscar LKent1ucCarr4942oShaLaver4710tRenaye RakArkansas Continued Care H54oTalmageAurora Surg613-KathleSouth DR625KoreaKorea52952JAmerican Financ9i[address]ToneIle[address] Surg64e69624VinceJ<MEASURE132Editor, commImogene Bu4574KoreaKorea23r7700 Cedar Swam<MEASUREBoulder Community HospitalMEN1324Barbaraann Sh40ArmaEditor, commImogene [id number] StatesOscar LKent62ucCarr3318oShaLaver5416tRenaye Ra67kTalmageEsse atVision [place]409811HartfoCPamelia HoitarEnedinaZollKindred Hospital Bay Areaie SKorePru37dyVick13725 AJeannett SenKentuckAugusto914782161Excell SA721610960Maxine 7821610960CaroliDoctors Hospital Of MantJuel BurrowGilda CGardNicho1610Frio Regional Hos536644016JacTresa EnEx16Katrinka Blazin8469MKentuckyoWilliam HaRevonda 4FKentuckyraDaM#Q4SturgeonExcell SeltzerU1L2409811914wan75msh821Edward JollyBeth Israel Deaconess Medical Center - East C16109616770 G>ce [place] 46M69624VinceJ Arizona Spine & Joint HospitallVin1324Barbaraann Sh40ArmaEditor, commImogene Burnssio(414)3822543163WaynardUnited StatesOscar LKent61ucCarr806oShaLaver738tRenaye RakPrisma Health Gre51eTalmageChesapeake R onNorth Spring BeKentuck409811(HartfoCPamelia HoitarEnedinaZollReno Orthopaedic Surgery Center LLCie SKorePrudy Feel69erVick1358757Jeannett SenKentuckAugusto914782161Excell SA501610960Maxine 7821610960CaroliThe University Of Kansas Health System [facility] Bend CamJuel BurrowGilda CGardNicho1610Summit As536644016JacTresa EnEx1610978VilKatrinka Blazin8469Molly95Kentucky4WilKentuckyliDaM#Q4New Excell SeltzerWiU1 09[place] SpiKentucky4409811HartfoCPamelia HoitarEnedinaZollWestern Pennsylvania Hospitalie SKorePrudy Feele61r8Vick1390914 N.Jeannett SenKentuckAugusto914782161Excell SA281610960Maxine 7821610960CarolinaSouthern Regional Medical CenJuel BurrowGilda CGardNicho1610Pagosa Mountain Hos536644016JacTresa EnEx1610Katrinka Blazin8469MoKentucKentuckykyDaM#Q4ElExcell SeltzerLU1L2409811914ganaRevonda 4Frances Furbi43msh241Edward JollySt Cloud Va Medical C16109616(310 G> Children'S 69M69624VinceJ lyUpdegraff VisioAurora Endoscopy Center LLCn LN(1324Barbaraann Sh40ArmaEditor, commImogene Burnssio773-18231030176WaynardUnited StatesOscar LKent55ucCarr8974oShaLaver6tRenaye RakProvi1dTalmageSea Pines Rehabili51Kathle thBanner EstrKentucky2409811HartfoCPamelia HoitarEnedinaZollWellbridge Hospital Of Fort Worthie SKorePr40udVick1391 M19anoJeannett SenKentuckAugusto914782161Excell SA591610960Maxine 7821610960CaroliPerry HospiJuel BurrowGilda CGardNicho1610Pipestone Co Med C & Asht536644016JacTresa EnEx161097Katrinka Blazin8469MKentuKentuckyckDaM#Q4Woodcliff Excell SeltzerBU1L2409811914eanHaRevonda 4Frances Furbi68msh311Edward JollyMs Band Of Choctaw Hos1610961693 G>ory Surgery 69M69624VinceJ lyColonNorthcrest Medical Centerial 1324Barbaraann Sh40ArmaEditor, commImogene Burnssio915-6741703148WaynardUnited StatesOscar LKent39ucCarr4983oShaLaver2648tRenaye RakT81hTalmagePeninsul(712)KathleSouth DR650KoreaKorea13952JAmerican Financ74i[address]5RToneIle[address] Clare Surgi43c69624VinceJ lyEndoscopy Center WBrunswick Community Hospitalilli1324Barbaraann Sh40ArmaEditor, commImogene Burnssio443-7655109160WaynardUnited StatesOscar LKent63ucCarr2585oShaLaver163tRenaye RakOutpatient Surgica28lTalmageL(585)KathleSouth D 1KTheda Oaks Gastroenterology And EKentucky5409811HartfoCPamelia HoitarEnedinaZollParrish Medical Centerie S41KoVick7035Jeannett SenKentuckAugusto914782161Excell SA261610960Maxine 7821610960CarolinaMental Health InstitJuel BurrowGilda CGardNicho1610Rosato Plastic Surgery Cente536644016JacTresa EnEx1610978Katrinka Blazin846KentuckyWilliam HaRevonda 4KentuckyFrDaM#Q4Stotonic VilExcell SeltzerQuU1 09Emory RehaKentucky409811(HartfoCPamelia HoitarEnedinaZollFroedtert South St Catherines Medical Ce81ntVick1353 S. 10WelJeannett SenKentuckAugusto914782161Excell SA831610960Maxine 7821610960CarolinRivertown Surgery Juel BurrowGilda CGardNicho1610St. Landry Extended Care Hos536644016JacTresa EnEx161097Katrinka Blazin8469MolKentuckylWilliaKentuckym DaM#Q4TallmExcell SeltzeU1L2409811914dan4Frances Furbi51msh861Edward JollyArnold Palmer Hospital For Chi161096166385 G>doscopy And 80S69624VinceJ lyPacificBaptist Surgery And Endoscopy Centers LLC Dba [facility] CRo1324Barbaraann Sh40ArmaEditor, commImogene Burnssio629-98645145WaynardUnited StatesOscar LKent51ucCarr172oShaLaver762tRenaye RakLandmark Hospital 60OTalmageVa Gulf Coast He26KathleSouth DR675KoreaKorea27952JAmerican Financ79i[address]8CandeleToneIle7054 La AlanLogan Mem29o69624VinceJ lyPain Treatment Center Of [place] LLC DbSKindred Hospital-Central Tampacot1324Barbaraann Sh40ArmaEditor, commImogene Burnssio249 3337271150WaynardUnited StatesOscar LKent72ucCarr6855oShaLaver6225tRenaye RakPeter71sTalmageBon Secours Richmond Com(321) Kat SoLaurel Laser AnKentucky409811HartfoCPamelia HoitarEnedinaZollWashington County Hospitalie SKor54ePVick132548 Jeannett SenKentuckAugusto914782161Excell SA661610960Maxine 7821610960CaroSouthwestern Endoscopy Center Juel BurrowGilda CGardNicho1610[facility] Morgan C536644016JacTresa EnEx1610978SpKatrinka BlaKentuckyziDaM#Q4MinneapExcell SeltzU1L2409811914nanky9William HaRevonda 4Frances Furbi21msh431Edward JollyMercy West16109616720 G>ba Clinical 70A69624VinceJ lyPomerado OutRichmond University Medical Center - Bayley Seton CampuspaDe1324Barbaraann Sh40ArmaEditor, commImogene Burnssio435-7270275139WaynardUnited StatesOscar LKent44ucCarr2345oShaLaver6485tRenaye RakPlai34nTalmagePhysician Surgery Center Of 939-Kathl utSouthlaKentucky5409811(HartfoCPamelia HoitarEnedinaZollUnited Memorial Medical Centerie84 SVick1325 66LowJeannett SenKentuckAugusto914782161Excell SA701610960Maxine 7821610960CaroliHighline Medical CenJuel BurrowGilda CGardNicho1610Clinch Memorial Hos536644016JacTresa EnEx1610978WKatrinka Blazin8469MolKentuckylWilliam HaRevonda 4Frances Furbi5Kentucky2mDaM#Q4FairExcell SeltzerStU1L2409811914kand JollyParkview Adventist Medical Center : [facility] Hos161096165292 G>re [place] 33M69624VinceJ<MEASUREMENPeachtree Orthopaedic Surgery Center At Piedmont LLCT>We1324Barbaraann Sh40ArmaEditor, commImogene Burnssio872-0470245159WaynardUnited StatesOscar LKent96ucCarr731oShaLaver7931tRenaye RakPennsylvania P97sTalmageMonroe KaSwall [place]7409811HartfoCPamelia HoitarEnedinaZollUniversity Hospital Suny Health Science Centerie S15KoVick137 East P4urpJeannett SenKentuckAugusto914782161Excell SA291610960Maxine 7821610960CaroliGeorgetown Community HospiJuel BurrowGilda CGardNicho1610Center For Ambulatory And Minimally Invasive Surger536644016JacTresa EnEx1610978ThrKatrinka Blazin8469MollyKentucky8WilliKentuckyamDaM#Q4TylersvExcell SeltzerU1L2409811914nan 4Frances Furbi56msh371Edward JollySt. Luke'S Rehabilitation Hos161096168345 G>ery Center G55r69624VinceJ lyCityDhhs Phs Naihs [facility] Indian HospitalYar1324Barbaraann Sh40ArmaEditor, commImogene Burnssio220-57711017115WaynardUnited StatesOscar LKent67ucCarr7234oShaLaver5155tRenaye RakStar V16aTalmageSt Joseph Hospital (978)Kathl utNorthern LiKentucky4409811HartfoCPamelia HoitarEnedinaZollWaupun Mem Hsptlie SKorePrudy Feeler(7Ren54eeVick138644 BJeannett SenKentuckAugusto914782161Excell SA211610960Maxine 7821610960CaroliSuncoast Behavioral Health CenJuel BurrowGilda CGardNicho1610B[facility]hav[facility] Hospital536644016JacTresa EnEx161097Katrinka Blazin8469MoKentuckylWillKentuckyiaDaM#Q4Grand PExcell SeltzerPrairU1L2409811914kana 4Frances Furbi46msh401Edward JollyArundel Ambulatory Surgery C161096163611 G>Park Tower 101S69624VinceJ lyHawthorn ChildPrecision Surgical Center Of Northwest Arkansas LLCrenS1324Barbaraann Sh40ArmaEditor, commImogene Burnssio(501) 2336312144WaynardUnited StatesOscar LKent35ucCarr8223oShaLaver3643tRenaye RakAncora 54PTalmageBaylor Scott & W e Saint FraKentucky409811HartfoCPamelia HoitarEnedinaZollRoanoke Ambulatory Surgery Center LLCie SKore41PrVick5036Jeannett SenKentuckAugusto914782161Excell SA551610960Maxine 7821610960CaroliWellstar Spalding Regional HospiJuel BurrowGilda CGardNicho1610Valley Surgical Cente536644016JacTresa EnEx161Katrinka Blazin8469MoKenKentuckytuDM#Q4TahleExcell SeltU1 09AvenKentucky409811HartfoCPamelia HoitarEnedinaZollEncompass Health Rehabilitation Hospital Of Midland/Odessaie60 SVick13845 ThJeannett SenKentuckAugusto914782161Excell SA671610960Maxine 7821610960CaroliPauls Valley General HospiJuel BurrowGilda CGardNicho1610North Palm Beach County Surgery Cente536644016JacTresa EnEx1610978RaKatrinka Blazin8469Molly41 SKentuckyWWilliam HKentuckyaRDaM#Q4Twain HExcell SeltU1 09Kelsey SeybolKentucky4409811HartfoCPamelia HoitarEnedinaZollLake Huron Medical Centerie SKorePr52udVick1346047 Jeannett SenKentuckAugusto914782161Excell SA841610960Maxine 7821610960CaroliTourney Plaza Surgical CenJuel BurrowGilda CGardNicho1610Eye Surgery Center Of Saint Augustin536644016JacTresa EnEx1610Katrinka Blazin8469MKentuckyoKentuckyWiDaM#Q4East PeExcell SeU1 09Wyoming CountyKentucky5409811(HartfoCPamelia HoitarEnedinaZollShepherd Centerie SKorePr33udVick13965297Jeannett SenKentuckAugusto914782161Excell SA491610960Maxine 7821610960CarolinSouthwestern Endoscopy Center Juel BurrowGilda CGardNicho1610Memorialcare Saddleback Medical C536644016JacTresa EnEx1610978NortKatrinka Blazin8469MollKentuckKentuckyyyDaM#Q4Bellows FExcell SeltzU1 09Filutowski Cataract AndKentucky4098HartfoCPamelia HoitarEnedinaZollValley Health Warren Memorial Hospitalie SKore52PrVick13647 StJeannett SenKentuckAugusto914782161Excell SA851610960Maxine 7821610960CaroliAscension - All SaiJuel BurrowGilda CGardNicho1610Excela Health Latrobe Hos536644016JacTresa EnEx1610978CitruKatrinka Blazin8469MKentKentuckyucDaM#Q4Uplands Excell SeltzerBrooklynU1L2409811914san HaRevonda 4Frances Furbi29msh481Edward JollyMyrtue Memorial Hos161096163893 G> [place] 60S69624VinceJ lyLivonia OutpatSmyth County Community HospitalAve (1324Barbaraann Sh40ArmaEditor, commImogene Burnssio628-2281643150WaynardUnited StatesOscar LKent39ucCarr3251oShaLaver5459tRenaye RakSurgical Institute18 TalmageSpringfield Hospital Inc - Dba [place] Behaviora(240)KathleSouth DR657KoreaKorea40952JAmerican Financ33i53 Academy StToneIle389 PAlanso2 SW.Henry Mayo Newhall Mem55o69624VinceJ lyAnthony Medical CenterMin1324Barbaraann Sh40ArmaEditor, commImogene Burnssio509-32387148WaynardUnited StatesOscar LKent68ucCarr6123oShaLaver137tRenaye RakGrand 93ITalmageThedacare Regional Medical Cent64KathleSouth DR675KoreaKorea72952JAmerican Financ6i[address]7VernoToneIle638A WilAlanso5Acadian [facility] ([facility] M35e69624VinceJ lSaLaurel Laser And Surgery Center Altoonaint1324Barbaraann Sh40ArmaEditor, commImogene Burnssio(323)6917757151WaynardUnited StatesOscar LKent53ucCarr5149oShaLaver273tRenaye RakWisconsi25nTalmageGeorge E Weems Me(567)K leSheriKentucky409811HartfoCPamelia HoitarEnedinaZollEagle Eye Surgery And Laser Cent25erVick13801 W75altJeannett SenKentuckAugusto914782161Excell SA381610960Maxine 7821610960CaroKaiser Permanente Sunnybrook Surgery CenJuel BurrowGilda CGardNicho1610Big Spring State Hos536644016JacTresa EnEx1610978Katrinka Blazin8469KentuckyMWiKentuckyllDaM#Q4Fairview CrossrExcell SeltzerU1L2409811914eannda 4Frances Furbi45msh771Edward JollyMaryland Eye Surgery Cente161096165230 G> [place] 39S69624VinceJ lKaiser Permanente Baldwin Park Medical CenterTheo(1324Barbaraann Sh40ArmaEditor, commImogene Burnssio208 [id number] StatesOscar LKent82ucCarr725oShaLaver7072tRenaye RakNew York Presbyterian Hospital - 33WTalmageWalden Behav319 Kat SoSurgery CeKentucky409811(HartfoCPamelia HoitarEnedinaZollEndoscopy Center Of The Rockies LLCie SKoreP47ruVick138794 North49 HoJeannett SenKentuckAugusto914782161Excell SA381610960Maxine 7821610960CaroliCameron Memorial Community Hospital Juel BurrowGilda CGardNicho1610Big Spring State Hos536644016JacTresa EnEx161Katrinka Blazin8469MolKentuckylWilliam HaRevonda 4Frances KentuckyFuDaM#Q4Sandy CExcell SeltzerU1L2409811914dan1Edward JollyBlue Island Hospital Co LLC Dba Metrosouth Medical C16109616635 G>ulatory Surg43e69624VinceJ<MEAS M5Venice RegiKentucky2409811HartfoCPamelia HoitarEnedinaZollDameron Hospitalie SKore54PrVick13934463 SyJeannett SenKentuckAugusto914782161Excell SA51610960Maxine 7821610960CaroliChase County Community HospiJuel BurrowGilda CGardNicho1610Va Medical Center And Ambulatory Care C536644016JacTresa EnEx1610978SodKatrinka Blazin8469Molly9KentuckyWilliam HaRevonda 4FranKentuckyceDM#Q4Lake Ellsworth AddiExcell SeltzerU1L2409811914lansh521Edward JollyEncompass Health Rehabilitation Hospital Of Vin161096162402 G>r668KoreaK8539 Wils824621J lyHermitaPicaMethodist Healthcare - Memphis Hospitalc1324Barbaraann Sh40ArmaEditor, commImogene Burnssio(936) 63530925112WaynardUnited StatesOscar LKent49ucCarr4724oShaLaver64tRenaye RakActd LLC Dba Green Mou37nTalmageEast Metro Endo740-KathleSouth DR672KoreaKorea49952JAmerican Financ6i5[address]4ToneIle589 RAlanso9600 GrI87s69624VinceJ<MEASURPleasHaxtun Hospital Districtant1324Barbaraann Sh40ArmaEditor, commImogene Burnssio2291565516WaynardUnited StatesOscar LKent79uc161Excell SelMaxine GlenMolly Ma49dRevonda [facility] Hosp13 09Stonewall JacksoKentucky6409811(HartfoCPamelia HoitarEnedinaZollSlidell Memorial Hospitalie SKore60PrVick14356Jeannett SenKentuckAugusto914782161Excell SA81610960Maxine 7821610960CarolinLake Charles Memorial Hospital For WoJuel BurrowGilda CGardNicho1610Porter Regional Hos536644016JacTresa EnEx1610978HooKatrinka Blazin8469Molly7Kentucky0William HaRevonda 4FranKentuckyceDaM#Q4KankExcell SeltzeU1L2409811914ranmsh81Edward JollySpaulding Hospital For Continuing Med Care Camb161096168581 G>Alanso13 LeaM4o69624VinceJ lyThe Center For Plastic And Reconstructive Surgeryudjo1324Barbaraann Sh40ArmaEditor, commImogene Burnssio(867)3133413127WaynardUnited StatesOscar LKent66ucCarr62oShaLaver2132tRenaye RakA74lTalmag(9 KaBienvilleKentuck409811HartfoCPamelia HoitarEnedinaZollSioux Falls Specialty Hospital, LLPie S36KoVi17ck1Jeannett SenKentuck1Augusto914782161Excell SA131610960Maxine 7821610960CaroliHouston Urologic Surgicenter Juel BurrowGilda CGardNicho1610Harris County Psychiatric C536644016JacTresa EnEx1610978ChampioKatrinka Blazin8469Molly8KentuKentuckyckDaM#Q4BournevExcell SeltzU1 0[place]2409811HartfoCPamelia HoitarEnedinaZollStafford County Hospi51taVick132397 WeJeannett SenKentuckAugusto914782161Excell SA381610960Maxine 7821610960CarolinaKahi MohJuel BurrowGilda CGardNicho1610[facility] And Health C536644016JacTresa EnEx16109Katrinka Blazin846KentucKentuckykyDaM#Q4CExcell SeltzerMoU1L2409811914yanaRevonda 4Frances Furbi35msh741Edward JollyTrigg County Hospital161096168685 G>rn [place] S66u69624VinceJ lyFlSurgicare Of Miramar LLCas 1324Barbaraann Sh40ArmaEditor, commImogene Burnssio650-3317507114WaynardUnited StatesOscar LKent87ucCarr5365oShaLaver266tRenaye RakMayo Clinic Healt59hTalmagePottstown Memorial(505)KathleSouth DR66KoreaKorea13952JAmerican Financ14i46 W. Pine LaneToneIle877Alanso35 Sister Emm27a69624VinceJ lyFirsthealth RColonial Outpatient Surgery CentericR1324Barbaraann Sh40ArmaEditor, commImogene Burnssio640-2723113137WaynardUnited StatesOscar LKent81ucCarr3222oShaLaver2117tRenaye RakSaint Mi59cTalmageSt. Catherine 82Acadiana EKentucky5409811HartfoCPamelia HoitarEnedinaZollLakeview Center - Psychiatric Hospitalie SKorePru38dyVick1364 N. 71RidJeannett SenKentucAugusto914782161Excell SA241610960Maxine 7821610960CaroliDigestive Health SpecialistsJuel BurrowGilda CGardNicho1610Well[facility] Regional Hos536644016JacTresa EnEx16Katrinka Blazin8469MollKenKentuckytuDaM#Q4UnalExcell SeltzerGarU1L2409811914nanm HaRevonda 4Frances Furbi8msh161Edward JollyJames P Thompson 16109616(41563 G>hopedic Surg48e69624VinceJ lyKearney Ambulatory Surgical Center LLC Dba HOutpatient Surgical Specialties Centerali1324Barbaraann Sh40ArmaEditor, commImogene Burnssio469-6571620149WaynardUnited StatesOscar LKent56ucCarr6034oShaLaver767tRenaye RakAscension 49STalmageBoynto337-Ka eSWest VaKentucky409811(HartfoCPamelia HoitarEnedinaZollSt. Lukes'S Regional Medical Centerie SK38orVick138027 P31ariJeannett SenKentuckAugusto914782161Excell SA671610960Maxine 7821610960CaroliEncino Surgical Center Juel BurrowGilda CGardNicho1610Angelina Theresa Bucci Eye Surgery C536644016JacTresa EnEx1610978PhiKatrinka Blazin8469KentuckyMWiKentuckyllDaM#Q4BerryExcell SeltzeU1L2409811914aannda 4Frances Furbi78msh681Edward JollyMagnolia Regional Health C161096162565 G>o Long Term 48A69624VinceJ lyKerlan HolVidant Bertie Hospitalts 1324Barbaraann Sh40ArmaEditor, commImogene Burnssio913-6992543147WaynardUnited StatesOscar LKent52ucCarr6714oShaLaver16tRenaye RakLehigh Vall60eTalmageUpson Regional571 KathleSouth DR649KoreaKorea28952JAmerican Financ3i2 Rockwell DrivToneIle827 Alanso40 North StTomah Mem50o69624VinceJ lyValleWilson Surgicentery Am1324Barbaraann Sh40ArmaEditor, commImogene Burnssio920-7390137130WaynardUnited StatesOscar LKent59ucCarr2972oShaLaver6462tRenaye RakBrown Memorial 76CTalmageHeart Of Florid670-KathleSo DGreater SpringfieldKentucky4409811(HartfoCPamelia HoitarEnedinaZollWm Darrell Gaskins LLC Dba Gaskins Eye Care And Surgery Cent22erVick134473Jeannett SenKentuckAugusto914782161Excell SA21610960Maxine 7821610960CaroliMemorial Hermann Southwest HospiJuel BurrowGilda CGardNicho1610Desert Cliffs Surgery Cente536644016JacTresa EnEx1610Katrinka Blazin8469KentuckyMWilliam HKentuckyaRDaM#Q4South La PaExcell SeltzerWoU1 09Evergreen EKentucky409811HartfoCPamelia HoitarEnedinaZollBailey Medical Centerie SKorePrudy61 FVick1385589 NJeannett SenKentuckAugusto914782161Excell SA351610960Maxine 7821610960CaroliGrand Street Gastroenterology Juel BurrowGilda CGardNicho1610Community Surgery Center Nort536644016JacTresa EnEx16109Katrinka Blazin8469MKentuckyKentuckyoWDaM#Q4GauExcell SeltzeU1L2409811914ranevonda 4Frances Furbi54msh651Edward JollyColumbia Tn Endoscopy As16109616241 G> Ambulatory 66S69624VinceJ lyTriad SurgeFreLower Keys Medical Centernch1324Barbaraann Sh40ArmaEditor, commImogene Burnssio825-0219141183WaynardUnited StatesOscar LKent66ucCarr8140oShaLaver798tRenaye RakHe74rTalmageMartha'S Vi41KathleSouth DR620KoreaKorea71952JAmerican Financ30544 Trus[address] AveToneIle125 S. dADesotKentucky4098HartfoCPamelia HoitarEnedinaZollNorthern Westchester Hospitalie 68SKVick13754352 NoJeannett SenKentuckAugusto914782161Excell SA161610960Maxine 7821610960CaroliH B Magruder Memorial HospiJuel BurrowGilda CGardNicho1610Wills Eye Hos536644016JacTresa EnEx161Katrinka Blazin8469MollyKentuckyWillKentuckyiaDaM#Q4MilExcell SeltzerMountainU1L2409811914eana 4Frances Furbi21msh481Edward JollyKings Eye Center Medical Grou16109616873 G>gene Bu3555rOs655KoreaK13J lyMPasadena Plastic Surgery Center IncckCh1324Barbaraann Sh40ArmaEditor, commImogene Burnssio952 [id number] StatesOscar LKent108ucCarr5320oShaLaver4681tRenaye RakYuma Reg54iTalmageUniversi OrBaptist Health RehabKentucky6409811(6HartfoCPamelia HoitarEnedinaZollAtmore Community Hospitali83e Vick13321 North S19ilvJeannett SenKentuck1Augusto914782161Excell SA741610960Maxine 7821610960CarolinChildrens Specialized HospiJuel BurrowGilda CGardNicho1610[address] Eye Surgery C536644016JacTresa EnEx1610978RapKatrinka Blazin8469MKentuckyoWKentuckyilDaM#Q4BeExcell SeltzerSanU1 09Ucsd Surgical CentKentucky9409811HartfoCPamelia HoitarEnedinaZollRuston Regional Specialty Hospitalie SKore21PrVick60362Jeannett SenKentuckAugusto914782161Excell SA601610960Maxine 7821610960CarolinBear Lake Memorial HospiJuel BurrowGilda CGardNicho1610[facility] Children - 536644016JacTresa EnEx1610978WKatrinka Blazin8469MoKentuckylWiKentuckyllDaM#Q4JaExcell SeltzeU1 09Care One At [place]2409811(HartfoCPamelia HoitarEnedinaZollBrattleboro Retreatie SKore78PrVick139531 S85ilvJeannett SenKentuckAugusto914782161Excell SA681610960Maxine 7821610960CaroBhc Streamwood Hospital Behavioral Health CenJuel BurrowGilda CGardNicho1610Plains Regional Medical Center C536644016JacTresa EnEx1610978BeuKatrinka Blazin8469MollKentuckyyWillKentuckyiaDaM#Q4OrExcell SeltzerCollU1 09Coordinated Health [place]54098HartfoCPamelia HoitarEnedinaZollParis Regional Medical Center - North Campusie SKorePr28udVick1325882Jeannett SenKentuckAugusto914782161Excell SA431610960Maxine 7821610960CaroliSt. Albans Community Living CenJuel BurrowGilda CGardNicho1610Carolina Mountain Gastroenterology Endoscopy Cente536644016JacTresa EnEx1610978PleKatrinka Blazin8469MKentuckyoWilKentuckyliDaM#Q4ProsExcell SeltU1 09[facility] Seay Behavioral [place]24098HartfoCPamelia HoitarEnedinaZollHca Houston Healthcare Southeastie SKorePrudy Fe19elVick133693 NJeannett SenKentuckAugusto914782161Excell SA101610960Maxine 7821610960CaroliOsu James Cancer Hospital & Solove Research InstitJuel BurrowGilda CGardNicho1610Puget Sound Gastroetnerology At Kirklandevergreen End536644016JacTresa EnEx1610978Katrinka Blazin8469MoKentuckylWillKentuckyiaDaM#Q4Mountain Excell SeltzerWU1L2409811914hana 4Frances Furbi21msh561Edward JollyCentral Az Gi And Liver [id number] G> [place] V8a69624VinceJ lyEyes Of YGreenville Endoscopy CenterRos1324Barbaraann Sh40ArmaEditor, commImogene Burnssio639-6626145177WaynardUnited StatesOscar LKent89ucCarr3143oShaLaver144tRenaye RakSouth Coast G40lTalmageWest Suburban(650) Kath ouEndo Surgi CenteKentucky24098HartfoCPamelia HoitarEnedinaZollNorthwest Endo Center LLCie SKorePrudy Feeler(40Ren16eeVick135822Jeannett SenKentuckAugusto914782161Excell SA161610960Maxine 7821610960CaroliFacey Medical FoundatJuel BurrowGilda CGardNicho1610Midwest Surgery C536644016JacTresa EnEx1610978East Katrinka Blazin8469MoKentuckylWilliam HaRevoKentuckyndDaM#Q4Wailua HomestExcell SeltzerBuU1 09KaKentucky5409811HartfoCPamelia HoitarEnedinaZollDuke Health Buhl Hospitalie SKore54PrVick1399233 BeJeannett SenKentuckAugusto914782161Excell SA671610960Maxine 7821610960CaroliThe Hospitals Of Providence East CamJuel BurrowGilda CGardNicho1610Western State Hos536644016JacTresa EnEx1610Katrinka Blazin8469MolKentuckylWiKentuckyllDaM#Q4SavaExcell SeltzU1L2409811914iannda 4Frances Furbi44msh261Edward JollyCountryside Surgery Cente16109616(2696 G>bas Hospital66 69624VinceJ lyWooster Milltown SpecThe Greenbrier CliniciAr1324Barbaraann Sh40ArmaEditor, commImogene Burnssio353656256185WaynardUnited StatesOscar LKent98ucCarr2854oShaLaver7054tRenaye RakEncompass Health Rehabilitation43 TalmageSoutheastern Ambula y Rome OrthopaKentucky4098HartfoCPamelia HoitarEnedinaZollSt. David'S Rehabilitation Centerie44 SVick1346222 Jeannett SenKentuckAugusto914782161Excell SA591610960Maxine 7821610960CaroliPacific Alliance Medical Center, IJuel BurrowGilda CGardNicho1610Gamma Surgery [id number] [id number] Blazin8469KentuckyMWilliam HaRevKentuckyonDaM#Q4KExcell [id number] Furbi25msh581Edward JollyBaptist Emergency Hospital - [id number] G>orPinecrest [id number] lyBaptist Memorial HoDublin Va Medical CenterMan1324Barbaraann Sh40ArmaEditor, commImogene [phone]) [id number] StatesOscar LKent41ucCarr3545oShaLaver5015tRenaye RakPeacehealth Cottage Grov49eTalmageSsm CKindreKentucky4409811HartfoCPamelia HoitarEnedinaZollSaint ALPhonsus Eagle Health Plz-Erie 12SKVick134574 Jeannett [id number] [id number] [id number] HospiJuel BurrowGilda CGardNicho1610[facility] [place] [id number] EnEx1610978Katrinka Blazin8469Molly89Kentucky2William HaRevoKentuckyndDaM#Q4McGrExcell SeltzU1 09[place]10409811HartfoCPamelia HoitarEnedinaZollUniversity Hospitals Rehabilitation Hospitalie SKore71PrVick138197 Nor34th Jeannett SenKentuckAugusto914782161Excell [id number] [id number] Surgery CenterJuel BurrowGilda CGardNicho1610[facility] [id number] EnEx16109Katrinka Blazin8469KentuckyMWilliam HaRevonda Kentucky4FDaM#Q4ManExcell [id number] JollyAdvanced Surgery Center Of Palm Beach [id number] G>iser Fnd Hos42p69624VinceJ<MEASUREMENMMental Health Institutearr1324Barbaraann Sh40ArmaEditor, commImogene Burnssio(843)[phone] StatesOscar LKent59ucCarr5968oShaLaver5774tRenaye RakSaint Fr76aTalmageWindhaven(854) KathleSouth DR655KoreaKorea38952JAmerican Financ28i[address]9MeinToneIle718 SouthAlanso13 South FUpstate New York Va Healthcare System (Western Ny Va Heal4t69624VinceJ lyQuadTAnkeny Medical Park Surgery Centeroms1324Barbaraann Sh40ArmaEditor, commImogene Burnssio585-466421017165WaynardUnited StatesOscar LKent12ucCarr499oShaLaver1663tRenaye RakMontr70oTalmageBear Lake Me93KathleSouth DR669KoreaKorea33952JAmerican Financ36i329 [place] CourToneIle9417 Lees CAlansoAndalusia Reg50i69624VinceJacqlyn Larsenital.rBal

## 2017-05-30 ENCOUNTER — Inpatient Hospital Stay (HOSPITAL_COMMUNITY): Payer: 59

## 2017-05-30 ENCOUNTER — Encounter (HOSPITAL_COMMUNITY): Payer: Self-pay | Admitting: General Surgery

## 2017-05-30 ENCOUNTER — Other Ambulatory Visit: Payer: Self-pay

## 2017-05-30 DIAGNOSIS — R188 Other ascites: Secondary | ICD-10-CM

## 2017-05-30 DIAGNOSIS — R945 Abnormal results of liver function studies: Secondary | ICD-10-CM

## 2017-05-30 DIAGNOSIS — N3000 Acute cystitis without hematuria: Secondary | ICD-10-CM

## 2017-05-30 DIAGNOSIS — K652 Spontaneous bacterial peritonitis: Secondary | ICD-10-CM

## 2017-05-30 DIAGNOSIS — D539 Nutritional anemia, unspecified: Secondary | ICD-10-CM

## 2017-05-30 DIAGNOSIS — N179 Acute kidney failure, unspecified: Secondary | ICD-10-CM

## 2017-05-30 DIAGNOSIS — D638 Anemia in other chronic diseases classified elsewhere: Secondary | ICD-10-CM

## 2017-05-30 DIAGNOSIS — K7290 Hepatic failure, unspecified without coma: Secondary | ICD-10-CM

## 2017-05-30 DIAGNOSIS — E876 Hypokalemia: Secondary | ICD-10-CM

## 2017-05-30 DIAGNOSIS — A4151 Sepsis due to Escherichia coli [E. coli]: Principal | ICD-10-CM

## 2017-05-30 DIAGNOSIS — D72829 Elevated white blood cell count, unspecified: Secondary | ICD-10-CM

## 2017-05-30 HISTORY — PX: IR PARACENTESIS: IMG2679

## 2017-05-30 LAB — BODY FLUID CELL COUNT WITH DIFFERENTIAL
Eos, Fluid: 0 %
Lymphs, Fluid: 6 %
Monocyte-Macrophage-Serous Fluid: 2 % — ABNORMAL LOW (ref 50–90)
Neutrophil Count, Fluid: 92 % — ABNORMAL HIGH (ref 0–25)
Total Nucleated Cell Count, Fluid: 950 cu mm (ref 0–1000)

## 2017-05-30 LAB — PROTEIN, PLEURAL OR PERITONEAL FLUID: Total protein, fluid: <3 g/dL

## 2017-05-30 LAB — CBC
HCT: 23.1 % — ABNORMAL LOW (ref 36.0–46.0)
Hemoglobin: 7.8 g/dL — ABNORMAL LOW (ref 12.0–15.0)
MCH: 39 pg — ABNORMAL HIGH (ref 26.0–34.0)
MCHC: 33.8 g/dL (ref 30.0–36.0)
MCV: 115.5 fL — ABNORMAL HIGH (ref 78.0–100.0)
Platelets: 154 10*3/uL (ref 150–400)
RBC: 2 MIL/uL — ABNORMAL LOW (ref 3.87–5.11)
RDW: 18.6 % — ABNORMAL HIGH (ref 11.5–15.5)
WBC: 18.3 10*3/uL — ABNORMAL HIGH (ref 4.0–10.5)

## 2017-05-30 LAB — COMPREHENSIVE METABOLIC PANEL
ALT: 13 U/L — ABNORMAL LOW (ref 14–54)
AST: 36 U/L (ref 15–41)
Albumin: 2.3 g/dL — ABNORMAL LOW (ref 3.5–5.0)
Alkaline Phosphatase: 52 U/L (ref 38–126)
Anion gap: 8 (ref 5–15)
BUN: 31 mg/dL — ABNORMAL HIGH (ref 6–20)
CO2: 21 mmol/L — ABNORMAL LOW (ref 22–32)
Calcium: 8.2 mg/dL — ABNORMAL LOW (ref 8.9–10.3)
Chloride: 97 mmol/L — ABNORMAL LOW (ref 101–111)
Creatinine, Ser: 2.64 mg/dL — ABNORMAL HIGH (ref 0.44–1.00)
GFR calc Af Amer: 23 mL/min — ABNORMAL LOW (ref >60)
GFR calc non Af Amer: 20 mL/min — ABNORMAL LOW (ref >60)
Glucose, Bld: 99 mg/dL (ref 65–99)
Potassium: 4.9 mmol/L (ref 3.5–5.1)
Sodium: 126 mmol/L — ABNORMAL LOW (ref 135–145)
Total Bilirubin: 8.3 mg/dL — ABNORMAL HIGH (ref 0.3–1.2)
Total Protein: 6.7 g/dL (ref 6.5–8.1)

## 2017-05-30 LAB — GRAM STAIN

## 2017-05-30 LAB — LACTIC ACID, PLASMA: Lactic Acid, Venous: 1.4 mmol/L (ref 0.5–1.9)

## 2017-05-30 LAB — GLUCOSE, CAPILLARY
Glucose-Capillary: 123 mg/dL — ABNORMAL HIGH (ref 65–99)
Glucose-Capillary: 97 mg/dL (ref 65–99)
Glucose-Capillary: 89 mg/dL (ref 65–99)
Glucose-Capillary: 81 mg/dL (ref 65–99)
Glucose-Capillary: 110 mg/dL — ABNORMAL HIGH (ref 65–99)

## 2017-05-30 LAB — PROCALCITONIN: Procalcitonin: 2.07 ng/mL

## 2017-05-30 MED ORDER — SODIUM CHLORIDE 0.9% FLUSH: 10.0000 mL | INTRAVENOUS | Status: DC | PRN

## 2017-05-30 MED ORDER — SODIUM CHLORIDE 0.9% FLUSH
10.0000 mL | Freq: Two times a day (BID) | INTRAVENOUS | Status: DC
  Administered 2017-05-30 – 2017-06-03 (×7): 10 mL

## 2017-05-30 MED ORDER — DEXTROSE 5 % IV SOLN
2.0000 g | INTRAVENOUS | Status: DC
  Administered 2017-05-30 – 2017-06-02 (×4): 2 g via INTRAVENOUS
  Filled 2017-05-30 (×7): qty 2

## 2017-05-30 MED ORDER — OCTREOTIDE ACETATE 100 MCG/ML IJ SOLN
100.0000 ug | Freq: Three times a day (TID) | INTRAMUSCULAR | Status: DC
  Administered 2017-05-30 – 2017-05-31 (×3): 100 ug via SUBCUTANEOUS
  Filled 2017-05-30 (×4): qty 1

## 2017-05-30 MED ORDER — ALBUMIN HUMAN 5 % IV SOLN
50.0000 g | Freq: Once | INTRAVENOUS | Status: AC
  Administered 2017-05-30: 12.5 g via INTRAVENOUS

## 2017-05-30 MED ORDER — LIDOCAINE HCL (PF) 2 % IJ SOLN
INTRAMUSCULAR | Status: DC | PRN
  Administered 2017-05-30: 10 mL

## 2017-05-30 MED ORDER — ALBUMIN HUMAN 5 % IV SOLN
87.5000 g | Freq: Once | INTRAVENOUS | Status: AC
  Administered 2017-05-30: 12.5 g via INTRAVENOUS
  Filled 2017-05-30: qty 2000

## 2017-05-30 MED ORDER — MIDODRINE HCL 5 MG PO TABS
10.0000 mg | ORAL_TABLET | Freq: Three times a day (TID) | ORAL | Status: DC
  Administered 2017-05-30 – 2017-05-31 (×4): 10 mg via ORAL
  Filled 2017-05-30 (×6): qty 2

## 2017-05-30 MED ORDER — LIDOCAINE 2% (20 MG/ML) 5 ML SYRINGE
INTRAMUSCULAR | Status: AC
  Filled 2017-05-30: qty 10

## 2017-05-30 NOTE — Consult Note (Signed)
PULMONARY / CRITICAL CARE MEDICINE   Name: Tina Haley MRN: [id number] DOB: 1966-02-10    ADMISSION DATE:  05/29/2017 CONSULTATION DATE: May 30, 2017  REFERRING MD: [facility]  CHIEF COMPLAINT: Hypotension.  Gram-negative rods in blood cultures x2.  Pro-calcitonin of 2.63.  Suspected sepsis  HISTORY OF PRESENT ILLNESS:   Tina Haley is a 51 year old female with known alcoholic cirrhosis.  Her last paracentesis was on 05/19/2017.  She had 5.1 L tapped at that time.  3 weeks prior to that she had a tooth extraction.  She has had multiple paracentesis in the past without problems.  She has had multiple paracentesis in the past without problems.  She does have esophageal varices that required banding in the past.  [facility] with chief complaint of abdominal pain.  Notes that her last paracentesis on 05/19/2017 that she did not obtain the usual relief that goes along with fluid.  She continued to have abdominal pain.  She notes left lower extremity numbness.  Abdominal pain is located in the right lower quadrant.  Note her creatinine is normally 0.76 is notable now for 2.52.  This admission she has been hemodynamically stable.  She does have 2 of 2 blood cultures with gram-negative rods.  1 of 2 blood cultures has E. coli and Enterobacter.  Has been placed on antibiotics per primary team.  She denies fevers chills sweats or purulent drainage.  Her abdomen is not tender.  Her abdomen is distended with obvious fluid wave. Pulmonary critical care asked to evaluate for sepsis.  Her pro-calcitonin is elevated.  She is hemodynamically stable at this time.  She is on appropriate antimicrobial therapy.  She is scheduled for paracentesis 05/30/2017.  She most likely need treatment with albumin and possibly normal saline after paracentesis.  That she has quit drinking.  Last drink was one year ago.  [facility]   PAST MEDICAL  HISTORY :  She  has a past medical history of Alcohol abuse (06/2015), Anemia (06/2015), Anxiety (06/2015), Ascites, Chronic gastritis, Chronic headaches (06/2015), Cirrhosis (HCC), Complication of anesthesia, Esophageal varices (HCC), Fatty liver (06/2015), GI bleed (06/2015, 10/2015), IDA (iron deficiency anemia), and Splenomegaly.  PAST SURGICAL HISTORY: She  has a past surgical history that includes ORIF tibia & fibula fractures; IR Paracentesis (02/01/2017); IR Paracentesis (02/19/2017); IR Paracentesis (03/09/2017); IR Paracentesis (04/06/2017); IR Paracentesis (05/17/2017); ESOPHAGOGASTRODUODENOSCOPY (EGD) possible varice banding (N/A, 04/18/2017); ESOPHAGEAL BANDING (N/A, 04/18/2017); ESOPHAGOGASTRODUODENOSCOPY (EGD) WITH PROPOFOL (N/A, 11/30/2015); and ESOPHAGOGASTRODUODENOSCOPY (EGD) (N/A, 11/04/2015).  No Known Allergies  No current facility-administered medications on file prior to encounter.    Current Outpatient Medications on File Prior to Encounter  Medication Sig  . furosemide (LASIX) 40 MG tablet Take 40 mg daily as needed by mouth for fluid.   . pantoprazole (PROTONIX) 40 MG tablet Take 1 tablet (40 mg total) by mouth daily.  . Prenatal Vit-Fe Fumarate-FA (PRENATAL MULTIVITAMIN) TABS tablet Take 1 tablet by mouth daily.   Marland Kitchen. spironolactone (ALDACTONE) 100 MG tablet Take 1 tablet (100 mg total) by mouth daily.    FAMILY HISTORY:  Her indicated that the status of her mother is unknown. She indicated that the status of her father is unknown. She indicated that the status of her maternal grandfather is unknown. She indicated that the status of her maternal aunt is unknown. She indicated that the status of her neg hx is unknown.   SOCIAL HISTORY: She  reports that  has never smoked. she has never used smokeless tobacco. She reports that she does not drink alcohol or use drugs.  REVIEW OF SYSTEMS:   10 point review of system taken, please see HPI for positives and  negatives.   SUBJECTIVE:  51 year old white female in no acute distress.  Currently hemodynamically stable except for a heart rate of 117.  She is awake and alert.  She does complain of left leg numbness.  Abdominal distention and discomfort.  VITAL SIGNS: BP (!) 100/45 (BP Location: Right Arm)   Pulse (!) 120   Temp 98 F (36.7 C) (Oral)   Resp 17   Ht 5\' 3"  (1.6 m)   Wt 125 lb (56.7 kg)   SpO2 94%   BMI 22.14 kg/m   HEMODYNAMICS:    VENTILATOR SETTINGS:    INTAKE / OUTPUT: I/O last 3 completed shifts: In: 2845 [P.O.:120; I.V.:2625; IV Piggyback:100] Out: 125 [Urine:125]  PHYSICAL EXAMINATION: General: White female in no acute distress. Neuro: Intact moves all extremities x4 follows commands.  Left lower extremity is 2+ edema of the right lower extremity 1+ edema she complains of numbness in the left leg. HEENT: No JVD or lymphadenopathy appreciated Cardiovascular: Heart sounds are regular in rate and rhythm.  Systolic blood pressure is 108.  Heart rate is 117. Lungs: Clear to auscultation in all fields. Abdomen: Distended with obvious fluid wave.  Tender in the right lower quadrant. Musculoskeletal: Intact Skin: Warm and dry  LABS:  BMET Recent Labs  Lab 05/28/17 2205 05/29/17 0949  NA 122* 124*  K 5.1 5.7*  CL 88* 94*  CO2 20* 21*  BUN 23* 25*  CREATININE 2.49* 2.50*  GLUCOSE 108* 99    Electrolytes Recent Labs  Lab 05/28/17 2205 05/29/17 0949  CALCIUM 9.0 8.2*    CBC Recent Labs  Lab 05/28/17 2205 05/29/17 0949  WBC 23.0* 21.1*  HGB 9.6* 8.4*  HCT 29.0* 25.0*  PLT 254 181    Coag's Recent Labs  Lab 05/29/17 0345  APTT 46*  INR 2.86    Sepsis Markers Recent Labs  Lab 05/29/17 0530 05/29/17 0627 05/29/17 0628 05/29/17 0949  LATICACIDVEN 3.28*  --  2.4* 1.7  PROCALCITON  --  2.63  --   --     ABG No results for input(s): PHART, PCO2ART, PO2ART in the last 168 hours.  Liver Enzymes Recent Labs  Lab 05/28/17 2205  05/29/17 0949  AST 54* 42*  ALT 23 17  ALKPHOS 91 68  BILITOT 10.8* 9.2*  ALBUMIN 2.2* 1.9*    Cardiac Enzymes No results for input(s): TROPONINI, PROBNP in the last 168 hours.  Glucose Recent Labs  Lab 05/29/17 0807 05/29/17 1205 05/29/17 1909 05/30/17 0130 05/30/17 0541  GLUCAP 98 89 65 123* 97    Imaging Ct Abdomen Pelvis Wo Contrast  Result Date: 05/29/2017 CLINICAL DATA:  Worsening abdominal distention and nausea. Alcoholic cirrhosis. EXAM: CT ABDOMEN AND PELVIS WITHOUT CONTRAST TECHNIQUE: Multidetector CT imaging of the abdomen and pelvis was performed following the standard protocol without IV contrast. COMPARISON:  Right upper quadrant ultrasound on 02/01/2017 FINDINGS: Lower chest: No acute findings. Hepatobiliary: Hepatic cirrhosis is demonstrated. Liver has heterogeneous attenuation, and hepatic neoplasm cannot be excluded on this unenhanced exam. Gallbladder is unremarkable. Pancreas: No mass or inflammatory process visualized on this unenhanced exam. Spleen:  Within normal limits in size. Adrenals/Urinary tract: No evidence of urolithiasis or hydronephrosis. Unremarkable unopacified urinary bladder. Stomach/Bowel: No evidence of obstruction or focal inflammatory  process. Vascular/Lymphatic: No pathologically enlarged lymph nodes identified. No evidence of abdominal aortic aneurysm. Abdominal portosystemic venous collaterals, consistent with portal venous hypertension Reproductive:  No mass or other significant abnormality. Other: Large amount of ascites seen within the abdomen and pelvis, as well as diffuse mesenteric and body wall edema. Musculoskeletal:  No suspicious bone lesions identified. IMPRESSION: Hepatic cirrhosis. Heterogeneous hepatic attenuation noted ; hepatocellular carcinoma cannot be excluded on this unenhanced exam. Consider further characterization with non-emergent abdomen MRI without and with contrast as the preferred exam. (Abd CT without and with contrast  would be appropriate if patient has contraindication to MRI or cannot cooperate with breath-holding.) Findings of portal venous hypertension, with large amount of ascites. Electronically Signed   By: Myles RosenthalJohn  Stahl M.D.   On: 05/29/2017 10:11     STUDIES:    CULTURES: 11 6 blood culture x1>> gram-negative rods 11-6 blood culture with E. coli and Citrobacter>>  ANTIBIOTICS: 11 6 Zosyn>>  SIGNIFICANT EVENTS: 05/30/2017 pulmonary critical care consult  LINES/TUBES:   DISCUSSION: 51 year old female with alcoholic cirrhosis.  Elevated pro-calcitonin.  Tachycardic.  Positive blood cultures for gram-negative rods.  Currently hemodynamically stable  ASSESSMENT / PLAN:  PULMONARY A: No acute issues chest x-ray is clear P:   Room air  CARDIOVASCULAR A:  Tachycardia Chronic hypertension P:  Albumin as needed May need fluid boluses.  RENAL Lab Results  Component Value Date   CREATININE 2.50 (H) 05/29/2017   CREATININE 2.49 (H) 05/28/2017   CREATININE 0.47 05/17/2017   Recent Labs  Lab 05/28/17 2205 05/29/17 0949  K 5.1 5.7*    A:   Acute renal insufficiency. Hyperkalemia. P:   Avoid nephrotoxins. Abdominal CT Gentle hydration Stop NSAIDs  GASTROINTESTINAL A:   Abdominal distention Colic cirrhosis P:   Plan for paracentesis 05/30/2017  HEMATOLOGIC A:   Elevated INR P:  Follow  INFECTIOUS A:   Positive blood cultures x2 P:   Antibiotics per [facility]  ENDOCRINE CBG (last 3)  Recent Labs    05/29/17 1909 05/30/17 0130 05/30/17 0541  GLUCAP 65 123* 97    A:   No acute issues P:     NEUROLOGIC A:   No acute issues P:   RASS goal: 1    FAMILY  - Updates: Patient and husband updated at bedside 05/30/2017  - Inter-disciplinary family meet or Palliative Care meeting due by:  day 7    [facility] Hospital[facility] Minor ACNP Ad[facility]ack[facility] PCCM Pager [phone] till 1 pm If no answer page [phone] 05/30/2017, 9:07 AM

## 2017-05-30 NOTE — Procedures (Signed)
Ultrasound-guided diagnostic paracentesis performed yielding 120cc liters of serous colored fluid. No immediate complications.  Tina Haley E 1:36 PM 05/30/2017

## 2017-05-30 NOTE — Care Management Note (Signed)
Case Management Note  Patient Details  Name: Onnie BoerKathy A Nagy MRN: [id number] Date of Birth: [birth year]  Subjective/Objective:   Pt admitted with SIRS - alcoholic cirrhosis - recurrent problem with periodic paracentesis - suffered hypotension post paracentesis              Action/Plan:  PTA from home with husband.   CM will continue to follow for discharge needs   Expected Discharge Date:                  Expected Discharge Plan:     In-House Referral:     Discharge planning Services  CM Consult  Post Acute Care Choice:    Choice offered to:     DME Arranged:    DME Agency:     HH Arranged:    HH Agency:     Status of Service:     If discussed at [employer] Length of [employer] Meetings, dates discussed:    Additional Comments:  Cherylann ParrClaxton, Ocean Schildt S, RN 05/30/2017, 3:47 PM

## 2017-05-30 NOTE — Progress Notes (Signed)
PHARMACY NOTE:  ANTIMICROBIAL RENAL DOSAGE ADJUSTMENT  Current antimicrobial regimen includes a mismatch between antimicrobial dosage and estimated renal function.  As per policy approved by the Pharmacy & Therapeutics and Medical Executive Committees, the antimicrobial dosage will be adjusted accordingly.  Current antimicrobial dosage:  Cefepime 1 gm IV q8hrs  Indication:  Bacteremia and SBP  Renal Function:  ARF  Estimated Creatinine Clearance: 20.9 mL/min (A) (by C-G formula based on SCr of 2.64 mg/dL (H)).    []      On intermittent HD, scheduled: []      On CRRT    Antimicrobial dosage has been changed to:  Cefepime 2 gm IV q24hrs  Additional comments:  Pharmacy will follow peripherally for any need to re-adjust dosing interval if renal function improves  Thank you for allowing pharmacy to be a part of this patient's care.  Dennie Fettersgan, Annell Canty Donovan, [facility]ipp[facility] County HospitalRPH  Pager: [phone] 05/30/2017 3:35 PM

## 2017-05-30 NOTE — Consult Note (Signed)
[facility] Consult: 9:57 AM 05/30/2017  LOS: 1 day    Referring Provider: Dr Elsworth Soho from [place] Primary Care Physician:  Cyndy Freeze, MD Primary Gastroenterologist:  Dr Havery Moros.  Also follows with [facility] liver.  Dr Dorcas Mcmurray.   Transplant coordinator [phone], his RN is Roderic Ovens [phone]   Reason for Consultation:  Decompensated cirrhosis with ascites and AKI.     HPI: Tina Haley is a 51 y.o. female.  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  Lost to GI follow up after 11/2015, reestablished in late 01/2017.  Anemia, though previously on po iron, not IDA as of 01/2017 assay.  Ascites.  Coagulopathy (PT/INR 24/2.1 on 05/02/17).  No thrombocytopenia.  No previous colonoscopy  EGDs 10/2015: for CGE, melena.  Bleeding, large, solitary esophageal varix banded.  ~ 5 non-bleeding GUs.   EGD 11/2015:  2 grade 1 esophageal varices, flattened with insufflation, no bleeding stigmata and not banded. Residual antral erythema, ulcer healed, biopsy: Mild, chronic gastritis.  No Helicobacter Pylori EGD 04/18/2017:  2 grade 1 varices with no stigmata, + scars of previous banding.  Banded x 4.   02/05/17 MRI abdomen: Cirrhosis, no findings suspicious for HCCA.  Small gastroesophageal and perigastric varices. Patent portal vein.  Moderate abdominal ascites. Set up for EGD 06/26/17.     Referred to [facility] liver transplant service by NP Charlaine Dalton as her insurance covers services at [facility], not at [facility].  Meld 29.  Ascites mgt complicated by diuretic induced hyponatremia and hypokalemia (Lasix).  TIPS not an option due to high MELD. Subsequently has undergone paracentesis x 5 from 02/01/17 - 05/17/17. On latest, fluid WBCs 297, 11% neutrophils.  Abdominal pain accompanies increased swelling and is relieved following paracentesis. AFP  level 4.7 on 05/02/17. LFTs in 01/2017 with T bili 7.0 to 7.9.  Transaminases 183 - 123.  ALT 52 - 40.  Normal alk phos.     At OV of 05/02/17 Dr Manuella Ghazi recommended restart Lasix 20 (she has not done, only using prn for swelling) and increasing Aldactone from 50 mg to 100 mg daily (she has done this)with close monitoring of labs.  Serial paracentesis as needed.  2 gm Na diet.  ?Start Cipro SBP prophylaxis.  Nadolol had already been discontinued due to refractory ascites.   BUN/creat normal on 10/3, 10/10 and 10/25.  Na ranging 130 to 128.   Pt has yet to establish with substnce abuse counsellor as needed to further transplant eligibility along.    Started having increased ascites, abd swelling and discomfort/pain as is normal for her.  This started Friday and got worse over the next few days.  She also had, for the first time, swelling in her lower extremities and a sense of altered sensation not quite numbness, in the left thigh.  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  She had gone back to work this week.  She is feels a little lethargic  but not had any increased somnolence or confusion.  She has not had any change in her bowel habits, she had 3 stools the day before yesterday.  She has some epigastric burning despite taking daily Protonix, she treats this with Tums as needed.  Some nausea in the last few days but no vomiting.  Issues include AKI.  Hyponatremia.  Ascites.  GNR and PCR + Enterobacter and E coli blood clx, E coli in urine.  GFR 60 >> 21 over last 2 weeks.  Urine Na <10. Na 122.    Lactic acid 2.4 >> 1.7.  Hgb 9.6 >> 8.4, MCV 116.  Was 11- 12 five months ago.  Hgb 10.4 on 05/02/17 Noncontrast Ct ab/pelvis: large ascites, cirrhosis, portal htn.        Past Medical History:  Diagnosis Date  . Alcohol abuse 06/2015  . Anemia 06/2015   due to ulcer bleeding, transfused 1 PRBC.    Marland Kitchen Anxiety  06/2015   Panic attacks  . Ascites   . Chronic gastritis   . Chronic headaches 06/2015   RARE MIGRAINE  . Cirrhosis ([place])   . Complication of anesthesia    11-30-15 ENDOSCOPY DID NOT GO WELL PAIN WITH MAY 2017 ENDOSCOPY WENT WELL PAIN FREE  . Esophageal varices ([place])   . Fatty liver 06/2015  . GI bleed 06/2015, 10/2015   gastric ulcer per EGD dr Odie Sera in [place]  . IDA (iron deficiency anemia)   . Splenomegaly     Past Surgical History:  Procedure Laterality Date  . IR PARACENTESIS  02/01/2017  . IR PARACENTESIS  02/19/2017  . IR PARACENTESIS  03/09/2017  . IR PARACENTESIS  04/06/2017  . IR PARACENTESIS  05/17/2017  . ORIF TIBIA & FIBULA FRACTURES      Prior to Admission medications   Medication Sig Start Date End Date Taking? Authorizing Provider  furosemide (LASIX) 40 MG tablet Take 40 mg daily as needed by mouth for fluid.  04/23/17  Yes [provider]  pantoprazole (PROTONIX) 40 MG tablet Take 1 tablet (40 mg total) by mouth daily. 03/07/17  Yes Armbruster, Carlota Raspberry, MD  Prenatal Vit-Fe Fumarate-FA (PRENATAL MULTIVITAMIN) TABS tablet Take 1 tablet by mouth daily.    Yes [provider]  spironolactone (ALDACTONE) 100 MG tablet Take 1 tablet (100 mg total) by mouth daily. 03/27/17  Yes Armbruster, Carlota Raspberry, MD    Scheduled Meds:  Infusions: . sodium chloride Stopped (05/29/17 1454)  . piperacillin-tazobactam (ZOSYN)  IV 3.375 g (05/30/17 0629)   PRN Meds: acetaminophen **OR** acetaminophen, ondansetron **OR** ondansetron (ZOFRAN) IV   Allergies as of 05/28/2017  . (No Known Allergies)    Family History  Problem Relation Age of Onset  . Peptic Ulcer Father   . Peripheral Artery Disease Father   . Diabetes Mother   . Coronary artery disease Mother   . Heart disease Mother   . Diabetes Maternal Aunt   . Heart disease Maternal Grandfather   . Stomach cancer Neg Hx   . Colon cancer Neg Hx   . Rectal cancer Neg Hx   . Pancreatic cancer Neg Hx      Social History   Socioeconomic History  . Marital status: Married    Spouse name: Not on file  . Number of children: Not on file  . Years of education: Not on file  . Highest education level: Not on file  Social Needs  . Financial resource strain: Not on file  . Food insecurity -  worry: Not on file  . Food insecurity - inability: Not on file  . Transportation needs - medical: Not on file  . Transportation needs - non-medical: Not on file  Occupational History  . Occupation: Care giver   Tobacco Use  . Smoking status: Never Smoker  . Smokeless tobacco: Never Used  Substance and Sexual Activity  . Alcohol use: No    Alcohol/week: 0.0 oz    Comment: as of November 03, 2015-has had no alcohol to drink  . Drug use: No  . Sexual activity: Not Currently    Partners: Male    Birth control/protection: Post-menopausal  Other Topics Concern  . Not on file  Social History Narrative   2 step children   Married    REVIEW OF SYSTEMS: Constitutional: Lethargy and some sense of weakness.  No increased fatigue. ENT:  No nose bleeds.  Post dental extraction bleeding 04/23/2016 required re-suturing.   Pulm: No shortness of breath or cough. CV:  No palpitations, no LE edema.  GU:  No hematuria, no frequency Gyn: last Pap/pelvic: "few years ago"  No mamogram.   GI:  See HPI Heme: No unusual bleeding or bruising. Transfusions: Required a unit of blood at the time of the GI bleed in 2017. Neuro:  No headaches, no peripheral tingling or numbness Derm:  No itching, no rash or sores.  Endocrine:  No sweats or chills.  No polyuria or dysuria Immunization: Has had Hep A and B vaccination on 02/08/2017 and 03/23/2017. Travel:  None beyond local counties in last few months.    PHYSICAL EXAM: Vital signs in last 24 hours: Vitals:   05/30/17 0422 05/30/17 0730  BP: (!) 100/45   Pulse: (!) 120   Resp: 17   Temp: 97.8 F (36.6 C) 98 F (36.7 C)  SpO2: 94%    Wt Readings from Last 3  Encounters:  05/29/17 56.7 kg (125 lb)  04/18/17 56.7 kg (125 lb)  01/31/17 72.6 kg (160 lb)    General: Pleasant.  Comfortable at rest.  Slightly jaundiced but not acutely ill-appearing Head: No facial asymmetry or swelling. Eyes: Slight scleral icterus.  EOMI.  No conjunctival pallor. Ears: Not hard of hearing Nose: No congestion or discharge. Mouth: Moist, pink, clear oral mucosa.  Dentition in good repair.  Tongue midline. Neck: No JVD, no masses, no thyromegaly. Lungs: Diminished but clear bilaterally.  No labored breathing or cough. Heart: Tacky, regular.  No murmurs rubs or gallops.  S1, S2 present. Abdomen: Distended, tense.  Tender, greater on the right side and the epigastrium but no guarding or rebound.  Bowel sounds distant with some tingling/tympanitic sounds.  She has difficulty sitting up or turning due to these activities trigger worsening of her abdominal pain.   Rectal: Deferred rectal exam. Musc/Skeltl: No joint erythema, swelling or gross deformities.  TKR scar on the right knee Extremities: Anasarca in the legs but this is not pitting.  It extends from the feet into the thighs.  It is moderate in appearance Neurologic: Alert.  Oriented x3.  No tremors or asterixis.  No limb weakness. Skin: No rashes, no telangiectasia, no sores. Nodes: No cervical adenopathy. Psych: Fluid speech.  Oriented x3.  Calm.  Intake/Output from previous day: 11/06 0701 - 11/07 0700 In: 1845 [P.O.:120; I.V.:1625; IV Piggyback:100] Out: 125 [Urine:125] Intake/Output this shift: No intake/output data recorded.  LAB RESULTS: Recent Labs    05/28/17 2205 05/29/17 0949  WBC 23.0* 21.1*  HGB 9.6* 8.4*  HCT 29.0*  25.0*  PLT 254 181   BMET Lab Results  Component Value Date   NA 124 (L) 05/29/2017   NA 122 (L) 05/28/2017   NA 128 (L) 05/17/2017   K 5.7 (H) 05/29/2017   K 5.1 05/28/2017   K 4.0 05/17/2017   CL 94 (L) 05/29/2017   CL 88 (L) 05/28/2017   CL 93 (L) 05/17/2017   CO2  21 (L) 05/29/2017   CO2 20 (L) 05/28/2017   CO2 28 05/17/2017   GLUCOSE 99 05/29/2017   GLUCOSE 108 (H) 05/28/2017   GLUCOSE 120 (H) 05/17/2017   BUN 25 (H) 05/29/2017   BUN 23 (H) 05/28/2017   BUN 8 05/17/2017   CREATININE 2.50 (H) 05/29/2017   CREATININE 2.49 (H) 05/28/2017   CREATININE 0.47 05/17/2017   CALCIUM 8.2 (L) 05/29/2017   CALCIUM 9.0 05/28/2017   CALCIUM 8.8 (L) 05/17/2017   LFT Recent Labs    05/28/17 2205 05/29/17 0949  PROT 7.7 6.8  ALBUMIN 2.2* 1.9*  AST 54* 42*  ALT 23 17  ALKPHOS 91 68  BILITOT 10.8* 9.2*  BILIDIR  --  3.3*  IBILI  --  5.9*   PT/INR Lab Results  Component Value Date   INR 2.86 05/29/2017   INR 2.22 04/25/2017   INR 2.1 (H) 01/31/2017   Hepatitis Panel No results for input(s): HEPBSAG, HCVAB, HEPAIGM, HEPBIGM in the last 72 hours. C-Diff No components found for: CDIFF Lipase     Component Value Date/Time   LIPASE 22 05/28/2017 2205    Drugs of Abuse  No results found for: LABOPIA, COCAINSCRNUR, LABBENZ, AMPHETMU, THCU, LABBARB   RADIOLOGY STUDIES: Ct Abdomen Pelvis Wo Contrast  Result Date: 05/29/2017 CLINICAL DATA:  Worsening abdominal distention and nausea. Alcoholic cirrhosis. EXAM: CT ABDOMEN AND PELVIS WITHOUT CONTRAST TECHNIQUE: Multidetector CT imaging of the abdomen and pelvis was performed following the standard protocol without IV contrast. COMPARISON:  Right upper quadrant ultrasound on 02/01/2017 FINDINGS: Lower chest: No acute findings. Hepatobiliary: Hepatic cirrhosis is demonstrated. Liver has heterogeneous attenuation, and hepatic neoplasm cannot be excluded on this unenhanced exam. Gallbladder is unremarkable. Pancreas: No mass or inflammatory process visualized on this unenhanced exam. Spleen:  Within normal limits in size. Adrenals/Urinary tract: No evidence of urolithiasis or hydronephrosis. Unremarkable unopacified urinary bladder. Stomach/Bowel: No evidence of obstruction or focal inflammatory process.  Vascular/Lymphatic: No pathologically enlarged lymph nodes identified. No evidence of abdominal aortic aneurysm. Abdominal portosystemic venous collaterals, consistent with portal venous hypertension Reproductive:  No mass or other significant abnormality. Other: Large amount of ascites seen within the abdomen and pelvis, as well as diffuse mesenteric and body wall edema. Musculoskeletal:  No suspicious bone lesions identified. IMPRESSION: Hepatic cirrhosis. Heterogeneous hepatic attenuation noted ; hepatocellular carcinoma cannot be excluded on this unenhanced exam. Consider further characterization with non-emergent abdomen MRI without and with contrast as the preferred exam. (Abd CT without and with contrast would be appropriate if patient has contraindication to MRI or cannot cooperate with breath-holding.) Findings of portal venous hypertension, with large amount of ascites. Electronically Signed   By: Earle Gell M.D.   On: 05/29/2017 10:11   Dg Chest Port 1 View  Result Date: 05/29/2017 CLINICAL DATA:  Shortness of breath, sepsis, and abdominal pain. EXAM: PORTABLE CHEST 1 VIEW COMPARISON:  01/07/2016 FINDINGS: Linear atelectasis in both lung bases. Heart size and pulmonary vascularity are normal. No consolidation in the lungs. No blunting of costophrenic angles. No pneumothorax. Mediastinal contours appear intact. IMPRESSION: Linear atelectasis in the  lung bases.  No focal consolidation. Electronically Signed   By: Lucienne Capers M.D.   On: 05/29/2017 06:29     IMPRESSION:   *  Cirrhosis due to ETOH.  Decompensated. MELD in previous months 29, now 35.  Chronically elevated LFTs actually improved in last 4 months but pt remains jaundiced.      *  Ascites.  Management hampered by hyponatremia, hypokalemia and AKI. Needing repeated paracentesis for mgt in recent months.     *  AKI. ? HRS.  Not oliguric.     *  Ecoli in blood and urine.    *  History of bleeding esophageal varices and gastric  ulcer 2017.  Has undergone esophageal banding as per HPI.  Latest EGD and banding 03/2017.  On chronic, daily Protonix but really having some breakthrough heartburn symptoms.  Has 12/4 EGD scheduled.    *  Macrocytic anemia.    *  Hyponatremia.  Hyperkalemia.    *  Leukocytosis, due to UTI (and SBP?)   PLAN:     *  Diagnostic paracentesis, has been ordered and can hopefully get done today. R/o SBP. Should not perform therapeutic paracentesis at this time due to the AK I.  *  ?  begin octreotide for possible HRS and ? Seek renal input.  *  b12 level in AM.     [name]  05/30/2017, 9:57 AM Pager: [phone]

## 2017-05-30 NOTE — Progress Notes (Addendum)
PROGRESS NOTE  Tina Haley ZOX:[id number] DOB: 04-16-1966 DOA: 05/29/2017 PCP: [name], MD  HPI/Recap of past 24 hours: BP slightly improved last night on IV fluid and albumin. Will attempt paracentesis this am.   Assessment/Plan: Principal Problem:   SIRS (systemic inflammatory response syndrome) (HCC) Active Problems:   Esophageal varices in alcoholic cirrhosis (HCC)   ARF (acute renal failure) (HCC)   Anemia   Decompensated hepatic cirrhosis (HCC)   Hyponatremia   Code Status: Full  Family Communication: No family members at bedside  Disposition Plan: Will keep another night until hemodynamically stable.    Consultants:  None  Procedures:  Paracentesis planned today  Antimicrobials:  IV vancomycin and IV zosyn  DVT prophylaxis:  SCDs   Objective: Vitals:   05/29/17 1515 05/29/17 1918 05/29/17 2327 05/30/17 0422  BP: (!) 87/42 (!) 100/50 (!) 106/59 (!) 100/45  Pulse: (!) 132 (!) 123 (!) 121 (!) 120  Resp: 20 17 16 17   Temp:  97.7 F (36.5 C) 98 F (36.7 C) 97.8 F (36.6 C)  TempSrc:  Oral Oral Oral  SpO2: 95% 98% 100% 94%  Weight:      Height:        Intake/Output Summary (Last 24 hours) at 05/30/2017 0743 Last data filed at 05/30/2017 0438 Gross per 24 hour  Intake 1845 ml  Output 125 ml  Net 1720 ml   Filed Weights   05/28/17 2155 05/29/17 0939  Weight: 56.7 kg (125 lb) 56.7 kg (125 lb)    Exam:   General:  Thin built uncomfortable due to back pain and severely distended abd  Cardiovascular: RRR no murmurs rubs or gallops  Respiratory: CTA no wheezes or rales  Abdomen: severely distended with fluid shift  Musculoskeletal: moves limbs freely  Skin: No open lesions  Psychiatry: Mood is anxious    Data Reviewed: CBC: Recent Labs  Lab 05/28/17 2205 05/29/17 0949  WBC 23.0* 21.1*  NEUTROABS 18.8*  --   HGB 9.6* 8.4*  HCT 29.0* 25.0*  MCV 116.0* 115.2*  PLT 254 181   Basic Metabolic Panel: Recent Labs    Lab 05/28/17 2205 05/29/17 0949  NA 122* 124*  K 5.1 5.7*  CL 88* 94*  CO2 20* 21*  GLUCOSE 108* 99  BUN 23* 25*  CREATININE 2.49* 2.50*  CALCIUM 9.0 8.2*   GFR: Estimated Creatinine Clearance: 22 mL/min (A) (by C-G formula based on SCr of 2.5 mg/dL (H)). Liver Function Tests: Recent Labs  Lab 05/28/17 2205 05/29/17 0949  AST 54* 42*  ALT 23 17  ALKPHOS 91 68  BILITOT 10.8* 9.2*  PROT 7.7 6.8  ALBUMIN 2.2* 1.9*   Recent Labs  Lab 05/28/17 2205  LIPASE 22   No results for input(s): AMMONIA in the last 168 hours. Coagulation Profile: Recent Labs  Lab 05/29/17 0345  INR 2.86   Cardiac Enzymes: No results for input(s): CKTOTAL, CKMB, CKMBINDEX, TROPONINI in the last 168 hours. BNP (last 3 results) No results for input(s): PROBNP in the last 8760 hours. HbA1C: No results for input(s): HGBA1C in the last 72 hours. CBG: Recent Labs  Lab 05/29/17 0807 05/29/17 1205 05/29/17 1909 05/30/17 0130 05/30/17 0541  GLUCAP 98 89 65 123* 97   Lipid Profile: No results for input(s): CHOL, HDL, LDLCALC, TRIG, CHOLHDL, LDLDIRECT in the last 72 hours. Thyroid Function Tests: No results for input(s): TSH, T4TOTAL, FREET4, T3FREE, THYROIDAB in the last 72 hours. Anemia Panel: No results for input(s): VITAMINB12, FOLATE, FERRITIN, TIBC,  IRON, RETICCTPCT in the last 72 hours. Urine analysis:    Component Value Date/Time   COLORURINE AMBER (A) 05/29/2017 0254   APPEARANCEUR CLOUDY (A) 05/29/2017 0254   LABSPEC 1.025 05/29/2017 0254   PHURINE 5.0 05/29/2017 0254   GLUCOSEU NEGATIVE 05/29/2017 0254   HGBUR NEGATIVE 05/29/2017 0254   BILIRUBINUR NEGATIVE 05/29/2017 0254   KETONESUR NEGATIVE 05/29/2017 0254   PROTEINUR 30 (A) 05/29/2017 0254   NITRITE NEGATIVE 05/29/2017 0254   LEUKOCYTESUR LARGE (A) 05/29/2017 0254   Sepsis Labs: @LABRCNTIP (procalcitonin:4,lacticidven:4)  ) Recent Results (from the past 240 hour(s))  Blood culture (routine x 2)     Status: None  (Preliminary result)   Collection Time: 05/29/17  3:30 AM  Result Value Ref Range Status   Specimen Description BLOOD RIGHT ARM  Final   Special Requests   Final    BOTTLES DRAWN AEROBIC AND ANAEROBIC Blood Culture results may not be optimal due to an excessive volume of blood received in culture bottles   Culture  Setup Time   Final    GRAM NEGATIVE RODS IN BOTH AEROBIC AND ANAEROBIC BOTTLES IDENTIFICATION TO FOLLOW CRITICAL RESULT CALLED TO, READ BACK BY AND VERIFIED WITH: Virgel GessK PERKINS PHARMD 1947 05/29/17 A BROWNING    Culture PENDING  Incomplete   Report Status PENDING  Incomplete  Blood culture (routine x 2)     Status: None (Preliminary result)   Collection Time: 05/29/17  3:52 AM  Result Value Ref Range Status   Specimen Description BLOOD LEFT ARM  Final   Special Requests   Final    IN PEDIATRIC BOTTLE Blood Culture results may not be optimal due to an excessive volume of blood received in culture bottles   Culture  Setup Time   Final    GRAM NEGATIVE RODS IN PEDIATRIC BOTTLE Organism ID to follow CRITICAL RESULT CALLED TO, READ BACK BY AND VERIFIED WITH: Virgel GessK PERKINS PHARMD 1947 05/29/17 A BROWNING    Culture PENDING  Incomplete   Report Status PENDING  Incomplete  Blood Culture ID Panel (Reflexed)     Status: Abnormal   Collection Time: 05/29/17  3:52 AM  Result Value Ref Range Status   Enterococcus species NOT DETECTED NOT DETECTED Final   Listeria monocytogenes NOT DETECTED NOT DETECTED Final   Staphylococcus species NOT DETECTED NOT DETECTED Final   Staphylococcus aureus NOT DETECTED NOT DETECTED Final   Streptococcus species NOT DETECTED NOT DETECTED Final   Streptococcus agalactiae NOT DETECTED NOT DETECTED Final   Streptococcus pneumoniae NOT DETECTED NOT DETECTED Final   Streptococcus pyogenes NOT DETECTED NOT DETECTED Final   Acinetobacter baumannii NOT DETECTED NOT DETECTED Final   Enterobacteriaceae species DETECTED (A) NOT DETECTED Final    Comment:  Enterobacteriaceae represent a large family of gram-negative bacteria, not a single organism. CRITICAL RESULT CALLED TO, READ BACK BY AND VERIFIED WITH: K PERKINS PHARMD 1947 05/29/17 A BROWNING    Enterobacter cloacae complex NOT DETECTED NOT DETECTED Final   Escherichia coli DETECTED (A) NOT DETECTED Final    Comment: CRITICAL RESULT CALLED TO, READ BACK BY AND VERIFIED WITH: K PERKINS PHARMD 1947 05/29/17 A BROWNING    Klebsiella oxytoca NOT DETECTED NOT DETECTED Final   Klebsiella pneumoniae NOT DETECTED NOT DETECTED Final   Proteus species NOT DETECTED NOT DETECTED Final   Serratia marcescens NOT DETECTED NOT DETECTED Final   Carbapenem resistance NOT DETECTED NOT DETECTED Final   Haemophilus influenzae NOT DETECTED NOT DETECTED Final   Neisseria meningitidis NOT DETECTED NOT DETECTED  Final   Pseudomonas aeruginosa NOT DETECTED NOT DETECTED Final   Candida albicans NOT DETECTED NOT DETECTED Final   Candida glabrata NOT DETECTED NOT DETECTED Final   Candida krusei NOT DETECTED NOT DETECTED Final   Candida parapsilosis NOT DETECTED NOT DETECTED Final   Candida tropicalis NOT DETECTED NOT DETECTED Final  MRSA PCR Screening     Status: None   Collection Time: 05/29/17  4:16 PM  Result Value Ref Range Status   MRSA by PCR NEGATIVE NEGATIVE Final    Comment:        The GeneXpert MRSA Assay (FDA approved for NASAL specimens only), is one component of a comprehensive MRSA colonization surveillance program. It is not intended to diagnose MRSA infection nor to guide or monitor treatment for MRSA infections.       Studies: Ct Abdomen Pelvis Wo Contrast  Result Date: 05/29/2017 CLINICAL DATA:  Worsening abdominal distention and nausea. Alcoholic cirrhosis. EXAM: CT ABDOMEN AND PELVIS WITHOUT CONTRAST TECHNIQUE: Multidetector CT imaging of the abdomen and pelvis was performed following the standard protocol without IV contrast. COMPARISON:  Right upper quadrant ultrasound on  02/01/2017 FINDINGS: Lower chest: No acute findings. Hepatobiliary: Hepatic cirrhosis is demonstrated. Liver has heterogeneous attenuation, and hepatic neoplasm cannot be excluded on this unenhanced exam. Gallbladder is unremarkable. Pancreas: No mass or inflammatory process visualized on this unenhanced exam. Spleen:  Within normal limits in size. Adrenals/Urinary tract: No evidence of urolithiasis or hydronephrosis. Unremarkable unopacified urinary bladder. Stomach/Bowel: No evidence of obstruction or focal inflammatory process. Vascular/Lymphatic: No pathologically enlarged lymph nodes identified. No evidence of abdominal aortic aneurysm. Abdominal portosystemic venous collaterals, consistent with portal venous hypertension Reproductive:  No mass or other significant abnormality. Other: Large amount of ascites seen within the abdomen and pelvis, as well as diffuse mesenteric and body wall edema. Musculoskeletal:  No suspicious bone lesions identified. IMPRESSION: Hepatic cirrhosis. Heterogeneous hepatic attenuation noted ; hepatocellular carcinoma cannot be excluded on this unenhanced exam. Consider further characterization with non-emergent abdomen MRI without and with contrast as the preferred exam. (Abd CT without and with contrast would be appropriate if patient has contraindication to MRI or cannot cooperate with breath-holding.) Findings of portal venous hypertension, with large amount of ascites. Electronically Signed   By: Myles RosenthalJohn  Stahl M.D.   On: 05/29/2017 10:11    Scheduled Meds:  Continuous Infusions: . sodium chloride Stopped (05/29/17 1454)  . piperacillin-tazobactam (ZOSYN)  IV 3.375 g (05/30/17 0629)  . vancomycin Stopped (05/30/17 0741)     LOS: 1 day   1. Sepsis 2/2 to e-coli bacteremia, suspect SBP -Paracentesis when hemodynamically stable this morning -IV Vanc and IV zosyn day#1 -IV cefotaxime 2gm IV q8H if body fluid positive  2. Decompensated liver cirrhosis  -hold off  patient's spironolactone due to acute renal failure, hyponatremia and hypotension.  -will resume spironolactone when more stable  3.   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  - Follow PT/INR will order vitamin K.  3. Acute renal failure most likely 2/2 intravascular fluid depletion -IV fluid boluses and albumin -avoid nephrotoxic agents  4. Macrocytic anemia  -Hg 8.4 -CBC -transfuse Hg less than 7  5.   Hyponatremia -IV fluid -repeat BMP every 4 hours -hold spironolactone   Darlin Droparole N Tracy Gerken, MD [facility] Pager [phone]  If 7PM-7AM, please contact night-coverage [email or web address] Password [email or web address] 05/30/2017, 7:43 AM

## 2017-05-31 ENCOUNTER — Other Ambulatory Visit: Payer: Self-pay

## 2017-05-31 DIAGNOSIS — K652 Spontaneous bacterial peritonitis: Secondary | ICD-10-CM

## 2017-05-31 DIAGNOSIS — K7031 Alcoholic cirrhosis of liver with ascites: Secondary | ICD-10-CM

## 2017-05-31 DIAGNOSIS — E871 Hypo-osmolality and hyponatremia: Secondary | ICD-10-CM

## 2017-05-31 DIAGNOSIS — I8510 Secondary esophageal varices without bleeding: Secondary | ICD-10-CM

## 2017-05-31 DIAGNOSIS — K7030 Alcoholic cirrhosis of liver without ascites: Secondary | ICD-10-CM

## 2017-05-31 LAB — BASIC METABOLIC PANEL
Anion gap: 8 (ref 5–15)
BUN: 32 mg/dL — ABNORMAL HIGH (ref 6–20)
CO2: 21 mmol/L — ABNORMAL LOW (ref 22–32)
Calcium: 8.2 mg/dL — ABNORMAL LOW (ref 8.9–10.3)
Chloride: 98 mmol/L — ABNORMAL LOW (ref 101–111)
Creatinine, Ser: 2.86 mg/dL — ABNORMAL HIGH (ref 0.44–1.00)
GFR calc Af Amer: 21 mL/min — ABNORMAL LOW (ref >60)
GFR calc non Af Amer: 18 mL/min — ABNORMAL LOW (ref >60)
Glucose, Bld: 120 mg/dL — ABNORMAL HIGH (ref 65–99)
Potassium: 5.1 mmol/L (ref 3.5–5.1)
Sodium: 127 mmol/L — ABNORMAL LOW (ref 135–145)

## 2017-05-31 LAB — URINE CULTURE: Culture: 80000 — AB

## 2017-05-31 LAB — VITAMIN B12: Vitamin B-12: 3670 pg/mL — ABNORMAL HIGH (ref 180–914)

## 2017-05-31 LAB — GLUCOSE, CAPILLARY
Glucose-Capillary: 114 mg/dL — ABNORMAL HIGH (ref 65–99)
Glucose-Capillary: 88 mg/dL (ref 65–99)
Glucose-Capillary: 79 mg/dL (ref 65–99)
Glucose-Capillary: 87 mg/dL (ref 65–99)

## 2017-05-31 LAB — CULTURE, BLOOD (ROUTINE X 2)

## 2017-05-31 LAB — PATHOLOGIST SMEAR REVIEW

## 2017-05-31 LAB — PROCALCITONIN: Procalcitonin: 2.13 ng/mL

## 2017-05-31 MED ORDER — HEPARIN SODIUM (PORCINE) 5000 UNIT/ML IJ SOLN: 5000.0000 [IU] | Freq: Three times a day (TID) | INTRAMUSCULAR | Status: DC

## 2017-05-31 MED ORDER — OCTREOTIDE ACETATE 100 MCG/ML IJ SOLN
200.0000 ug | Freq: Three times a day (TID) | INTRAMUSCULAR | Status: DC
  Administered 2017-05-31 – 2017-06-03 (×9): 200 ug via SUBCUTANEOUS
  Filled 2017-05-31 (×12): qty 2

## 2017-05-31 MED ORDER — ALBUMIN HUMAN 5 % IV SOLN
12.5000 g | Freq: Once | INTRAVENOUS | Status: AC
  Administered 2017-05-31: 12.5 g via INTRAVENOUS

## 2017-05-31 MED ORDER — KETOROLAC TROMETHAMINE 15 MG/ML IJ SOLN
15.0000 mg | Freq: Once | INTRAMUSCULAR | Status: AC
  Administered 2017-05-31: 15 mg via INTRAVENOUS
  Filled 2017-05-31: qty 1

## 2017-05-31 MED ORDER — SODIUM CHLORIDE 0.9 % IV SOLN
1000.0000 mL | INTRAVENOUS | Status: DC
  Administered 2017-06-01 (×2): 1000 mL via INTRAVENOUS

## 2017-05-31 MED ORDER — HYOSCYAMINE SULFATE 0.5 MG/ML IJ SOLN
0.2500 mg | Freq: Once | INTRAMUSCULAR | Status: AC
  Administered 2017-05-31: 0.25 mg via INTRAVENOUS
  Filled 2017-05-31: qty 0.5

## 2017-05-31 NOTE — Progress Notes (Addendum)
PROGRESS NOTE  Tina Haley WUJ:[id number] DOB: [birth year] DOA: 05/29/2017 PCP: [name], MD  HPI/Recap of past 24 hours: Pt seen and examined with her parents at her bedside. She reports abdominal pain which is intermittent sharp as much 8/10 in severity. Improved with pain meds. Denies nausea.  Assessment/Plan: Principal Problem:   SIRS (systemic inflammatory response syndrome) (HCC) Active Problems:   Esophageal varices in alcoholic cirrhosis (HCC)   ARF (acute renal failure) (HCC)   Anemia   Decompensated hepatic cirrhosis (HCC)   Hyponatremia   SBP (spontaneous bacterial peritonitis) (HCC)  Code Status: Full  Family Communication: No family members at bedside  Disposition Plan: Will keep another night until hemodynamically stable.    Consultants:  None  Procedures:  Paracentesis planned today  Antimicrobials:  IV vancomycin and IV zosyn   DVT prophylaxis:  SCDs, heparin 5000 U sq TID    Objective: Vitals:   05/31/17 0700 05/31/17 1055 05/31/17 1244 05/31/17 1700  BP: (!) 103/55 (!) 104/55 (!) 107/55 (!) 104/58  Pulse: (!) 114 (!) 119 (!) 118 (!) 116  Resp: 16 18 (!) 23 18  Temp: 97.6 F (36.4 C) (!) 97.2 F (36.2 C)    TempSrc: Oral Oral    SpO2: 93%  91% 90%  Weight:      Height:        Intake/Output Summary (Last 24 hours) at 05/31/2017 1737 Last data filed at 05/31/2017 0300 Gross per 24 hour  Intake 1675 ml  Output 325 ml  Net 1350 ml   Filed Weights   05/28/17 2155 05/29/17 0939  Weight: 56.7 kg (125 lb) 56.7 kg (125 lb)    Exam:   General:Thin built uncomfortable due to back pain and severely distended abd  Cardiovascular:RRR no murmurs rubs or gallops  Respiratory:CTA no wheezes or rales  Abdomen:severely distended with fluid shift  Musculoskeletal:moves limbs freely  Skin:No open lesions  Psychiatry:Mood is anxious     Data Reviewed: CBC: Recent Labs  Lab 05/28/17 2205  05/29/17 0949 05/30/17 1242  WBC 23.0* 21.1* 18.3*  NEUTROABS 18.8*  --   --   HGB 9.6* 8.4* 7.8*  HCT 29.0* 25.0* 23.1*  MCV 116.0* 115.2* 115.5*  PLT 254 181 154   Basic Metabolic Panel: Recent Labs  Lab 05/28/17 2205 05/29/17 0949 05/30/17 1242 05/31/17 0313  NA 122* 124* 126* 127*  K 5.1 5.7* 4.9 5.1  CL 88* 94* 97* 98*  CO2 20* 21* 21* 21*  GLUCOSE 108* 99 99 120*  BUN 23* 25* 31* 32*  CREATININE 2.49* 2.50* 2.64* 2.86*  CALCIUM 9.0 8.2* 8.2* 8.2*   GFR: Estimated Creatinine Clearance: 19.3 mL/min (A) (by C-G formula based on SCr of 2.86 mg/dL (H)). Liver Function Tests: Recent Labs  Lab 05/28/17 2205 05/29/17 0949 05/30/17 1242  AST 54* 42* 36  ALT 23 17 13*  ALKPHOS 91 68 52  BILITOT 10.8* 9.2* 8.3*  PROT 7.7 6.8 6.7  ALBUMIN 2.2* 1.9* 2.3*   Recent Labs  Lab 05/28/17 2205  LIPASE 22   No results for input(s): AMMONIA in the last 168 hours. Coagulation Profile: Recent Labs  Lab 05/29/17 0345  INR 2.86   Cardiac Enzymes: No results for input(s): CKTOTAL, CKMB, CKMBINDEX, TROPONINI in the last 168 hours. BNP (last 3 results) No results for input(s): PROBNP in the last 8760 hours. HbA1C: No results for input(s): HGBA1C in the last 72 hours. CBG: Recent Labs  Lab 05/30/17 1141 05/30/17 1633 05/30/17 2321 05/31/17 9562  05/31/17 1219  GLUCAP 89 81 110* 114* 88   Lipid Profile: No results for input(s): CHOL, HDL, LDLCALC, TRIG, CHOLHDL, LDLDIRECT in the last 72 hours. Thyroid Function Tests: No results for input(s): TSH, T4TOTAL, FREET4, T3FREE, THYROIDAB in the last 72 hours. Anemia Panel: Recent Labs    05/31/17 0313  VITAMINB12 3,670*   Urine analysis:    Component Value Date/Time   COLORURINE AMBER (A) 05/29/2017 0254   APPEARANCEUR CLOUDY (A) 05/29/2017 0254   LABSPEC 1.025 05/29/2017 0254   PHURINE 5.0 05/29/2017 0254   GLUCOSEU NEGATIVE 05/29/2017 0254   HGBUR NEGATIVE 05/29/2017 0254   BILIRUBINUR NEGATIVE 05/29/2017 0254    KETONESUR NEGATIVE 05/29/2017 0254   PROTEINUR 30 (A) 05/29/2017 0254   NITRITE NEGATIVE 05/29/2017 0254   LEUKOCYTESUR LARGE (A) 05/29/2017 0254   Sepsis Labs: @LABRCNTIP (procalcitonin:4,lacticidven:4)  ) Recent Results (from the past 240 hour(s))  Urine culture     Status: Abnormal   Collection Time: 05/29/17  2:54 AM  Result Value Ref Range Status   Specimen Description URINE, RANDOM  Final   Special Requests NONE  Final   Culture 80,000 COLONIES/mL ESCHERICHIA COLI (A)  Final   Report Status 05/31/2017 FINAL  Final   Organism ID, Bacteria ESCHERICHIA COLI (A)  Final      Susceptibility   Escherichia coli - MIC*    AMPICILLIN 8 SENSITIVE Sensitive     CEFAZOLIN <=4 SENSITIVE Sensitive     CEFTRIAXONE <=1 SENSITIVE Sensitive     CIPROFLOXACIN <=0.25 SENSITIVE Sensitive     GENTAMICIN <=1 SENSITIVE Sensitive     IMIPENEM <=0.25 SENSITIVE Sensitive     NITROFURANTOIN <=16 SENSITIVE Sensitive     TRIMETH/SULFA <=20 SENSITIVE Sensitive     AMPICILLIN/SULBACTAM <=2 SENSITIVE Sensitive     PIP/TAZO <=4 SENSITIVE Sensitive     Extended ESBL NEGATIVE Sensitive     * 80,000 COLONIES/mL ESCHERICHIA COLI  Blood culture (routine x 2)     Status: Abnormal   Collection Time: 05/29/17  3:30 AM  Result Value Ref Range Status   Specimen Description BLOOD RIGHT ARM  Final   Special Requests   Final    BOTTLES DRAWN AEROBIC AND ANAEROBIC Blood Culture results may not be optimal due to an excessive volume of blood received in culture bottles   Culture  Setup Time   Final    GRAM NEGATIVE RODS IN BOTH AEROBIC AND ANAEROBIC BOTTLES CRITICAL RESULT CALLED TO, READ BACK BY AND VERIFIED WITH: K PERKINS PHARMD 1947 05/29/17 A BROWNING    Culture (A)  Final    ESCHERICHIA COLI SUSCEPTIBILITIES PERFORMED ON PREVIOUS CULTURE WITHIN THE LAST 5 DAYS.    Report Status 05/31/2017 FINAL  Final  Blood culture (routine x 2)     Status: Abnormal   Collection Time: 05/29/17  3:52 AM  Result Value Ref  Range Status   Specimen Description BLOOD LEFT ARM  Final   Special Requests   Final    IN PEDIATRIC BOTTLE Blood Culture results may not be optimal due to an excessive volume of blood received in culture bottles   Culture  Setup Time   Final    GRAM NEGATIVE RODS IN PEDIATRIC BOTTLE CRITICAL RESULT CALLED TO, READ BACK BY AND VERIFIED WITH: K PERKINS PHARMD 1947 05/29/17 A BROWNING    Culture ESCHERICHIA COLI (A)  Final   Report Status 05/31/2017 FINAL  Final   Organism ID, Bacteria ESCHERICHIA COLI  Final      Susceptibility   Escherichia  coli - MIC*    AMPICILLIN <=2 SENSITIVE Sensitive     CEFAZOLIN <=4 SENSITIVE Sensitive     CEFEPIME <=1 SENSITIVE Sensitive     CEFTAZIDIME <=1 SENSITIVE Sensitive     CEFTRIAXONE <=1 SENSITIVE Sensitive     CIPROFLOXACIN <=0.25 SENSITIVE Sensitive     GENTAMICIN <=1 SENSITIVE Sensitive     IMIPENEM <=0.25 SENSITIVE Sensitive     TRIMETH/SULFA <=20 SENSITIVE Sensitive     AMPICILLIN/SULBACTAM <=2 SENSITIVE Sensitive     PIP/TAZO <=4 SENSITIVE Sensitive     Extended ESBL NEGATIVE Sensitive     * ESCHERICHIA COLI  Blood Culture ID Panel (Reflexed)     Status: Abnormal   Collection Time: 05/29/17  3:52 AM  Result Value Ref Range Status   Enterococcus species NOT DETECTED NOT DETECTED Final   Listeria monocytogenes NOT DETECTED NOT DETECTED Final   Staphylococcus species NOT DETECTED NOT DETECTED Final   Staphylococcus aureus NOT DETECTED NOT DETECTED Final   Streptococcus species NOT DETECTED NOT DETECTED Final   Streptococcus agalactiae NOT DETECTED NOT DETECTED Final   Streptococcus pneumoniae NOT DETECTED NOT DETECTED Final   Streptococcus pyogenes NOT DETECTED NOT DETECTED Final   Acinetobacter baumannii NOT DETECTED NOT DETECTED Final   Enterobacteriaceae species DETECTED (A) NOT DETECTED Final    Comment: Enterobacteriaceae represent a large family of gram-negative bacteria, not a single organism. CRITICAL RESULT CALLED TO, READ BACK BY  AND VERIFIED WITH: K PERKINS PHARMD 1947 05/29/17 A BROWNING    Enterobacter cloacae complex NOT DETECTED NOT DETECTED Final   Escherichia coli DETECTED (A) NOT DETECTED Final    Comment: CRITICAL RESULT CALLED TO, READ BACK BY AND VERIFIED WITH: K PERKINS PHARMD 1947 05/29/17 A BROWNING    Klebsiella oxytoca NOT DETECTED NOT DETECTED Final   Klebsiella pneumoniae NOT DETECTED NOT DETECTED Final   Proteus species NOT DETECTED NOT DETECTED Final   Serratia marcescens NOT DETECTED NOT DETECTED Final   Carbapenem resistance NOT DETECTED NOT DETECTED Final   Haemophilus influenzae NOT DETECTED NOT DETECTED Final   Neisseria meningitidis NOT DETECTED NOT DETECTED Final   Pseudomonas aeruginosa NOT DETECTED NOT DETECTED Final   Candida albicans NOT DETECTED NOT DETECTED Final   Candida glabrata NOT DETECTED NOT DETECTED Final   Candida krusei NOT DETECTED NOT DETECTED Final   Candida parapsilosis NOT DETECTED NOT DETECTED Final   Candida tropicalis NOT DETECTED NOT DETECTED Final  MRSA PCR Screening     Status: None   Collection Time: 05/29/17  4:16 PM  Result Value Ref Range Status   MRSA by PCR NEGATIVE NEGATIVE Final    Comment:        The GeneXpert MRSA Assay (FDA approved for NASAL specimens only), is one component of a comprehensive MRSA colonization surveillance program. It is not intended to diagnose MRSA infection nor to guide or monitor treatment for MRSA infections.   Gram stain     Status: None   Collection Time: 05/30/17  1:37 PM  Result Value Ref Range Status   Specimen Description FLUID PERITONEAL  Final   Special Requests NONE  Final   Gram Stain   Final    WBC PRESENT,BOTH PMN AND MONONUCLEAR NO ORGANISMS SEEN CYTOSPIN SMEAR    Report Status 05/30/2017 FINAL  Final  Culture, body fluid-bottle     Status: None (Preliminary result)   Collection Time: 05/30/17  1:37 PM  Result Value Ref Range Status   Specimen Description FLUID PERITONEAL  Final   Special  Requests BOTTLES DRAWN AEROBIC AND ANAEROBIC  Final   Culture NO GROWTH < 24 HOURS  Final   Report Status PENDING  Incomplete      Studies: No results found.  Scheduled Meds: . midodrine  10 mg Oral TID WC  . octreotide  200 mcg Subcutaneous Q8H  . sodium chloride flush  10-40 mL Intracatheter Q12H    Continuous Infusions: . sodium chloride 1,000 mL (05/31/17 1443)  . ceFEPime (MAXIPIME) IV Stopped (05/30/17 1749)     LOS: 2 days   1. Sepsis 2/2 to e-coli bacteremia, e-coli UTI, suspected SBP -Paracentesis when hemodynamically stable -IV cefepime 2gm IV daily empirically for SBP would also cover bacteremia and UTI -body fluid no growth in 24 hr  2. Decompensated liver cirrhosis -gi following; we appreciate recs -octreotide, midodrine, albumin, -hold off patient's spironolactone due to acute renal failure, hyponatremia and hypotension.  -will resume spironolactone when more stable  3.   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  -Follow PT/INR will order vitamin K. -INR 2.86 (05/29/17) -hold heparin or other anticoagulants  3. Hepatorenal syndrome -oliguria with worsening creatinine -IV fluid boluses and albumin -avoid nephrotoxic agents -bmp am, if worse, contact Nephrology  4. Macrocytic anemia -Hg 8.4 -CBC -transfuse Hg less than 7  5.   Hyponatremia -improving Na+ 127 (124) -IV fluid -repeat BMP every 4 hours -hold spironolactone    Darlin Droparole N Lendell Gallick, MD [facility] Pager [phone]  If 7PM-7AM, please contact night-coverage [email or web address] Password [email or web address] 05/31/2017, 5:37 PM

## 2017-05-31 NOTE — Plan of Care (Signed)
Pt is able to maintain pain control by asking for pain meds before pain level gets too uncomfortable. Will continue to monitor.

## 2017-05-31 NOTE — Progress Notes (Signed)
[facility] Progress Note  Chief Complaint:    Cirrhosis, abdominal distention  Subjective: Feels miserable. Abdomen so distended that it is uncomfortable  Objective:  Vital signs in last 24 hours: Temp:  [97.6 F (36.4 C)-97.9 F (36.6 C)] 97.6 F (36.4 C) (11/08 0700) Pulse Rate:  [107-123] 114 (11/08 0700) Resp:  [14-20] 16 (11/08 0700) BP: (95-105)/(43-58) 103/55 (11/08 0700) SpO2:  [90 %-99 %] 93 % (11/08 0700) Last BM Date: 05/29/17 General:   Alert, white female in NAD EENT:  Normal hearing, icteric sclera  Heart:  Regular rate and rhythm, 1-2+ BLE edema Pulm: Normal respiratory effort Abdomen: massively distended, a few bowel sounds, mild diffuse , no masses felt. No hepatomegaly.    Neurologic:  Alert and  oriented x4;  grossly normal neurologically. Psych:  Pleasant, cooperative.  Normal mood and affect.   Intake/Output from previous day: 11/07 0701 - 11/08 0700 In: 3325 [P.O.:480; I.V.:2795; IV Piggyback:50] Out: 325 [Urine:325] Intake/Output this shift: No intake/output data recorded.  Lab Results: Recent Labs    05/28/17 2205 05/29/17 0949 05/30/17 1242  WBC 23.0* 21.1* 18.3*  HGB 9.6* 8.4* 7.8*  HCT 29.0* 25.0* 23.1*  PLT 254 181 154   BMET Recent Labs    05/29/17 0949 05/30/17 1242 05/31/17 0313  NA 124* 126* 127*  K 5.7* 4.9 5.1  CL 94* 97* 98*  CO2 21* 21* 21*  GLUCOSE 99 99 120*  BUN 25* 31* 32*  CREATININE 2.50* 2.64* 2.86*  CALCIUM 8.2* 8.2* 8.2*   LFT Recent Labs    05/29/17 0949 05/30/17 1242  PROT 6.8 6.7  ALBUMIN 1.9* 2.3*  AST 42* 36  ALT 17 13*  ALKPHOS 68 52  BILITOT 9.2* 8.3*  BILIDIR 3.3*  --   IBILI 5.9*  --    PT/INR Recent Labs    05/29/17 0345  LABPROT 29.8*  INR 2.86   Hepatitis Panel No results for input(s): HEPBSAG, HCVAB, HEPAIGM, HEPBIGM in the last 72 hours.  Ir Paracentesis  Result Date: 05/30/2017 INDICATION: Patient with history of alcoholic cirrhosis and recurrent  abdominal ascites. Patient admitted with abdominal pain and E coli bacteremia. Request is made for diagnostic paracentesis. EXAM: ULTRASOUND GUIDED DIAGNOSTIC PARACENTESIS MEDICATIONS: 2% lidocaine COMPLICATIONS: None immediate. PROCEDURE: Informed written consent was obtained from the patient after a discussion of the risks, benefits and alternatives to treatment. A timeout was performed prior to the initiation of the procedure. Initial ultrasound scanning demonstrates a large amount of ascites within the left lower abdominal quadrant. The left lower abdomen was prepped and draped in the usual sterile fashion. 2% lidocaine was used for local anesthesia. Following this, a 19 gauge, 7-cm, Yueh catheter was introduced. An ultrasound image was saved for documentation purposes. The paracentesis was performed. The catheter was removed and a dressing was applied. The patient tolerated the procedure well without immediate post procedural complication. FINDINGS: A total of approximately 120 cc of serous fluid was removed. Samples were sent to the laboratory as requested by the clinical team. IMPRESSION: Successful ultrasound-guided paracentesis yielding 120 cc of peritoneal fluid. Read by: Barnetta ChapelKelly Osborne, PA-C Electronically Signed   By: Jolaine ClickArthur  Hoss M.D.   On: 05/30/2017 13:39    ASSESSMENT / PLAN:   1. 51 yo female with decompensated ETOH cirrhosis with ascites / new SBP. She has E.coli bacteremia with E.coli UTI. Ascitic fluid culture is pending.  Diuresis complicated by hypokalemia / hyponatremia.  -continue cefepime Q8 hours. WBC down some to 18. -  got IV albumin yesterday and for IV albumin 1.0 g/kg tomorrow (day 3) -low sodium diet.  -Unable to have LVP at present due to HRS -HCC screening. AFP 4.6 in July. No hepatic lesions on MR in July  2. HRS. Renal function continues to deteriorate  -IV albumin as above -getting midodrine and [place] Octreotide -Avoid nephrotoxic medications.     Principal  Problem:   SIRS (systemic inflammatory response syndrome) (HCC) Active Problems:   Esophageal varices in alcoholic cirrhosis (HCC)   ARF (acute renal failure) (HCC)   Anemia   Decompensated hepatic cirrhosis (HCC)   Hyponatremia   SBP (spontaneous bacterial peritonitis) (HCC)    LOS: 2 days   Willette ClusterPaula Nashonda Limberg ,NP 05/31/2017, 9:50 AM  Pager number [phone]

## 2017-06-01 ENCOUNTER — Inpatient Hospital Stay (HOSPITAL_COMMUNITY): Payer: 59

## 2017-06-01 ENCOUNTER — Encounter (HOSPITAL_COMMUNITY): Payer: Self-pay | Admitting: Student

## 2017-06-01 ENCOUNTER — Ambulatory Visit (HOSPITAL_COMMUNITY): Payer: 59

## 2017-06-01 DIAGNOSIS — K767 Hepatorenal syndrome: Secondary | ICD-10-CM

## 2017-06-01 DIAGNOSIS — Z09 Encounter for follow-up examination after completed treatment for conditions other than malignant neoplasm: Secondary | ICD-10-CM

## 2017-06-01 DIAGNOSIS — R7881 Bacteremia: Secondary | ICD-10-CM

## 2017-06-01 DIAGNOSIS — Z5189 Encounter for other specified aftercare: Secondary | ICD-10-CM

## 2017-06-01 HISTORY — PX: IR PARACENTESIS: IMG2679

## 2017-06-01 LAB — COMPREHENSIVE METABOLIC PANEL
ALT: 16 U/L (ref 14–54)
AST: 44 U/L — ABNORMAL HIGH (ref 15–41)
Albumin: 2.8 g/dL — ABNORMAL LOW (ref 3.5–5.0)
Alkaline Phosphatase: 48 U/L (ref 38–126)
Anion gap: 11 (ref 5–15)
BUN: 40 mg/dL — ABNORMAL HIGH (ref 6–20)
CO2: 16 mmol/L — ABNORMAL LOW (ref 22–32)
Calcium: 8.8 mg/dL — ABNORMAL LOW (ref 8.9–10.3)
Chloride: 100 mmol/L — ABNORMAL LOW (ref 101–111)
Creatinine, Ser: 3.52 mg/dL — ABNORMAL HIGH (ref 0.44–1.00)
GFR calc Af Amer: 16 mL/min — ABNORMAL LOW (ref >60)
GFR calc non Af Amer: 14 mL/min — ABNORMAL LOW (ref >60)
Glucose, Bld: 78 mg/dL (ref 65–99)
Potassium: 5 mmol/L (ref 3.5–5.1)
Sodium: 127 mmol/L — ABNORMAL LOW (ref 135–145)
Total Bilirubin: 8.2 mg/dL — ABNORMAL HIGH (ref 0.3–1.2)
Total Protein: 6.6 g/dL (ref 6.5–8.1)

## 2017-06-01 LAB — GLUCOSE, CAPILLARY
Glucose-Capillary: 76 mg/dL (ref 65–99)
Glucose-Capillary: 78 mg/dL (ref 65–99)
Glucose-Capillary: 71 mg/dL (ref 65–99)

## 2017-06-01 LAB — PROCALCITONIN: Procalcitonin: 2.2 ng/mL

## 2017-06-01 LAB — CBC
HCT: 26.5 % — ABNORMAL LOW (ref 36.0–46.0)
Hemoglobin: 8.7 g/dL — ABNORMAL LOW (ref 12.0–15.0)
MCH: 38.7 pg — ABNORMAL HIGH (ref 26.0–34.0)
MCHC: 32.8 g/dL (ref 30.0–36.0)
MCV: 117.8 fL — ABNORMAL HIGH (ref 78.0–100.0)
Platelets: 192 10*3/uL (ref 150–400)
RBC: 2.25 MIL/uL — ABNORMAL LOW (ref 3.87–5.11)
RDW: 19 % — ABNORMAL HIGH (ref 11.5–15.5)
WBC: 17.6 10*3/uL — ABNORMAL HIGH (ref 4.0–10.5)

## 2017-06-01 LAB — BODY FLUID CELL COUNT WITH DIFFERENTIAL
Eos, Fluid: 0 %
Lymphs, Fluid: 15 %
Monocyte-Macrophage-Serous Fluid: 1 % — ABNORMAL LOW (ref 50–90)
Neutrophil Count, Fluid: 84 % — ABNORMAL HIGH (ref 0–25)
Total Nucleated Cell Count, Fluid: 520 cu mm (ref 0–1000)

## 2017-06-01 LAB — PROTIME-INR
INR: 2.68
Prothrombin Time: 28.3 seconds — ABNORMAL HIGH (ref 11.4–15.2)

## 2017-06-01 MED ORDER — ALBUMIN HUMAN 5 % IV SOLN
INTRAVENOUS | Status: AC
  Administered 2017-06-01: 12:00:00
  Filled 2017-06-01: qty 250

## 2017-06-01 MED ORDER — LIDOCAINE HCL (PF) 1 % IJ SOLN
INTRAMUSCULAR | Status: AC
  Filled 2017-06-01: qty 30

## 2017-06-01 MED ORDER — ALBUMIN HUMAN 5 % IV SOLN
50.0000 g | Freq: Once | INTRAVENOUS | Status: AC
  Administered 2017-06-01: 50 g via INTRAVENOUS

## 2017-06-01 MED ORDER — FENTANYL CITRATE (PF) 100 MCG/2ML IJ SOLN
25.0000 ug | INTRAMUSCULAR | Status: DC | PRN
  Administered 2017-06-01 (×3): 25 ug via INTRAVENOUS
  Filled 2017-06-01 (×3): qty 2

## 2017-06-01 MED ORDER — LIDOCAINE HCL 1 % IJ SOLN
INTRAMUSCULAR | Status: DC | PRN
  Administered 2017-06-01: 10 mL

## 2017-06-01 MED ORDER — MORPHINE SULFATE (PF) 2 MG/ML IV SOLN
1.0000 mg | INTRAVENOUS | Status: DC | PRN
Start: 2017-06-01 — End: 2017-06-01
  Filled 2017-06-01: qty 1

## 2017-06-01 NOTE — Progress Notes (Signed)
Foley catheter placed per orders and protocol. When pt was being cleaned in preparation for the catheter RN noted LG amount of white discharge from the vaginal area. The vaginal area was very red and raw.  The catheter was placed with small about of dark amber urine the urine was couldy with sediment Pt was bladder scanned before catheter placement and the bladder scan resulted in >999 and post catheter placement the result was >999. Pt abd is very tight and distend. Pt is very edematous from diaphagm down. MD was made aware.

## 2017-06-01 NOTE — Consult Note (Signed)
Referring Provider: No ref. provider found Primary Care Physician:  [name], Brian, MD Primary Nephrologist:     Reason for Consultation:  Acute kidney injury in setting of advanced liver disease    HPI:  This is a 51 year old lady with alcoholic liver disease ad esophageal varices ad banding she has has recurrent ascites and paracentesis  [facility] and has been recommended for a liver transplant She presents with increased in abdominal pain and poor appetite with increase in ascites She has had worsening renal failure and oliguria with a serum creatinine that has increased to above 3 and a strong suspicion for hepatorenal syndrome  Blood pressures have been marginal and the patient has been started on midodrine and IV octreotide She was also placed on Maxipime for SBP   Past Medical History:  Diagnosis Date  . Alcohol abuse 06/2015  . Anemia 06/2015   due to ulcer bleeding, transfused 1 PRBC.    Marland Kitchen. Anxiety 06/2015   Panic attacks  . Ascites   . Chronic gastritis   . Chronic headaches 06/2015   RARE MIGRAINE  . Cirrhosis (HCC)   . Complication of anesthesia    11-30-15 ENDOSCOPY DID NOT GO WELL PAIN WITH MAY 2017 ENDOSCOPY WENT WELL PAIN FREE  . Esophageal varices (HCC)   . Fatty liver 06/2015  . GI bleed 06/2015, 10/2015   gastric ulcer per EGD dr Braulio ConteMeisenheimer in [place]  . IDA (iron deficiency anemia)   . Splenomegaly     Past Surgical History:  Procedure Laterality Date  . IR PARACENTESIS  02/01/2017  . IR PARACENTESIS  02/19/2017  . IR PARACENTESIS  03/09/2017  . IR PARACENTESIS  04/06/2017  . IR PARACENTESIS  05/17/2017  . IR PARACENTESIS  05/30/2017  . IR PARACENTESIS  06/01/2017  . ORIF TIBIA & FIBULA FRACTURES      Prior to Admission medications   Medication Sig Start Date End Date Taking? Authorizing Provider  furosemide (LASIX) 40 MG tablet Take 40 mg daily as needed by mouth for fluid.  04/23/17  Yes [provider]  pantoprazole (PROTONIX) 40 MG tablet Take 1 tablet (40 mg total) by mouth daily. 03/07/17  Yes Armbruster, Willaim RayasSteven P, MD  Prenatal Vit-Fe Fumarate-FA (PRENATAL MULTIVITAMIN) TABS tablet Take 1 tablet by mouth daily.    Yes [provider]  spironolactone (ALDACTONE) 100 MG tablet Take 1 tablet (100 mg total) by mouth daily. 03/27/17  Yes Armbruster, Willaim RayasSteven P, MD    Current Facility-Administered Medications  Medication Dose Route Frequency Provider Last Rate Last Dose  . 0.9 %  sodium chloride infusion  1,000 mL Intravenous Continuous Meredith PelGuenther, Paula M, NP 75 mL/hr at 06/01/17 0800 1,000 mL at 06/01/17 0800  . acetaminophen (TYLENOL) tablet 650 mg  650 mg Oral Q6H PRN Eduard ClosKakrakandy, Arshad N, MD   650 mg at 05/31/17 2129   Or  . acetaminophen (TYLENOL) suppository 650 mg  650 mg Rectal Q6H PRN Eduard ClosKakrakandy, Arshad N, MD      . ceFEPIme (MAXIPIME) 2 g in dextrose 5 % 50 mL IVPB  2 g Intravenous Q24H Pyrtle, Carie CaddyJay M, MD   Stopped at 06/01/17 1646  . fentaNYL (SUBLIMAZE) injection 25 mcg  25 mcg Intravenous Q2H PRN Beverley FiedlerPyrtle, Jay M, MD   25 mcg at 06/01/17 1509  . lidocaine (PF) (XYLOCAINE) 1 % injection           . lidocaine (XYLOCAINE) 1 % (with pres)  injection    PRN Hoyt Koch, PA   10 mL at 06/01/17 1222  . lidocaine (XYLOCAINE) 2 % injection    PRN Barnetta Chapel, PA-C   10 mL at 05/30/17 1305  . midodrine (PROAMATINE) tablet 10 mg  10 mg Oral TID WC Pyrtle, Carie Caddy, MD   10 mg at 05/31/17 1811  . octreotide (SANDOSTATIN) injection 200 mcg  200 mcg Subcutaneous Q8H Meredith Pel, NP   200 mcg at 06/01/17 1635  . ondansetron (ZOFRAN) tablet 4 mg  4 mg Oral Q6H PRN Eduard Clos, MD       Or  . ondansetron [facility]) injection 4 mg  4 mg Intravenous Q6H PRN Eduard Clos, MD   4 mg at 05/29/17 0603  . sodium chloride flush (NS) 0.9 % injection 10-40 mL  10-40 mL Intracatheter Q12H Hall, Carole N, DO   10 mL at 05/31/17 2129  . sodium chloride flush (NS)  0.9 % injection 10-40 mL  10-40 mL Intracatheter PRN Dow Adolph N, DO        Allergies as of 05/28/2017  . (No Known Allergies)    Family History  Problem Relation Age of Onset  . Peptic Ulcer Father   . Peripheral Artery Disease Father   . Diabetes Mother   . Coronary artery disease Mother   . Heart disease Mother   . Diabetes Maternal Aunt   . Heart disease Maternal Grandfather   . Stomach cancer Neg Hx   . Colon cancer Neg Hx   . Rectal cancer Neg Hx   . Pancreatic cancer Neg Hx     Social History   Socioeconomic History  . Marital status: Married    Spouse name: Not on file  . Number of children: Not on file  . Years of education: Not on file  . Highest education level: Not on file  Social Needs  . Financial resource strain: Not on file  . Food insecurity - worry: Not on file  . Food insecurity - inability: Not on file  . Transportation needs - medical: Not on file  . Transportation needs - non-medical: Not on file  Occupational History  . Occupation: Care giver   Tobacco Use  . Smoking status: Never Smoker  . Smokeless tobacco: Never Used  Substance and Sexual Activity  . Alcohol use: No    Alcohol/week: 0.0 oz    Comment: as of November 03, 2015-has had no alcohol to drink  . Drug use: No  . Sexual activity: Not Currently    Partners: Male    Birth control/protection: Post-menopausal  Other Topics Concern  . Not on file  Social History Narrative   2 step children   Married    Review of Systems: Gen: Denies any fever, chills, sweats, + anorexia, + fatigue, + weakness, + malaise,  HEENT: No visual complaints, No history of Retinopathy. Normal external appearance No Epistaxis or Sore throat. No sinusitis.   CV: Denies chest pain, angina, palpitations, syncope, orthopnea, PND, peripheral edema, and claudication. Resp: Denies dyspnea at rest, dyspnea with exercise, cough, sputum, wheezing, coughing up blood, and pleurisy. GI: increased in abdominal girth   And increase abdominal pain GU : Denies urinary burning, blood in urine, urinary frequency, urinary hesitancy, nocturnal urination, and urinary incontinence.  No renal calculi. MS: Denies joint pain, limitation of movement, and swelling, stiffness, low back pain, extremity pain. Denies ++ muscle weakness, cramps, atrophy.  No use of non steroidal antiinflammatory drugs.  Derm: Denies rash,++  itching, ++ dry skin, hives, moles, warts, or unhealing ulcers. Heme: Denies bruising, bleeding, and enlarged lymph nodes. Neuro: No headache.  No diplopia. No dysarthria.  No dysphasia.  No history of CVA.  No Seizures. No paresthesias.   Endocrine No DM.  No Thyroid disease.  No Adrenal disease.  Physical Exam: Vital signs in last 24 hours: Temp:  [97.5 F (36.4 C)-97.6 F (36.4 C)] 97.5 F (36.4 C) (11/09 1545) Pulse Rate:  [113-123] 117 (11/09 1545) Resp:  [15-22] 21 (11/09 1545) BP: (90-115)/(47-61) 90/52 (11/09 1545) SpO2:  [91 %-100 %] 98 % (11/09 1545) Weight:  [157 lb 13.6 oz (71.6 kg)] 157 lb 13.6 oz (71.6 kg) (11/09 0600) Last BM Date: 05/29/17 General:   Somewhat somnolent lady  Head:  Normocephalic and atraumatic. Eyes:  Sclera clear, ++ icterus.   Conjunctiva pink. Ears:  Normal auditory acuity. Nose:  No deformity, discharge,  or lesions. Mouth:  No deformity or lesions, dentition normal. Neck:  Supple; no masses or thyromegaly. JVP not elevated Lungs:  Clear throughout to auscultation.   No wheezes, crackles, or rhonchi. No acute distress. Heart:  Regular rate and rhythm; no murmurs, clicks, rubs,  or gallops. Abdomen:  Distended with ascites  Msk:  Symmetrical without gross deformities. Normal posture. Pulses:  No carotid, renal, femoral bruits. DP and PT symmetrical and equal Extremities:  Without clubbing   3 + edema  Neurologic:  Alert and  oriented x4;  grossly normal neurologically. Skin:  Intact without significant lesions or rashes.   Intake/Output from previous  day: 11/08 0701 - 11/09 0700 In: 1171.3 [P.O.:240; I.V.:931.3] Out: 25 [Urine:25] Intake/Output this shift: Total I/O In: 1725 [I.V.:675; IV Piggyback:1050] Out: -   Lab Results: Recent Labs    05/30/17 1242 06/01/17 0528  WBC 18.3* 17.6*  HGB 7.8* 8.7*  HCT 23.1* 26.5*  PLT 154 192   BMET Recent Labs    05/30/17 1242 05/31/17 0313 06/01/17 0528  NA 126* 127* 127*  K 4.9 5.1 5.0  CL 97* 98* 100*  CO2 21* 21* 16*  GLUCOSE 99 120* 78  BUN 31* 32* 40*  CREATININE 2.64* 2.86* 3.52*  CALCIUM 8.2* 8.2* 8.8*   LFT Recent Labs    06/01/17 0528  PROT 6.6  ALBUMIN 2.8*  AST 44*  ALT 16  ALKPHOS 48  BILITOT 8.2*   PT/INR Recent Labs    06/01/17 0528  LABPROT 28.3*  INR 2.68   Hepatitis Panel No results for input(s): HEPBSAG, HCVAB, HEPAIGM, HEPBIGM in the last 72 hours.  Studies/Results: [place] Ren[facility]  Result Date: 06/01/2017 CLINICAL DATA:  Acute kidney injury.  History of cirrhosis. EXAM: RENAL / URINARY TRACT ULTRASOUND COMPLETE COMPARISON:  CT abdomen and pelvis 05/29/2017 FINDINGS: Examination was mildly limited by patient pain. Right Kidney: Length: 12.6 cm. Echogenicity within normal limits. No mass or hydronephrosis visualized. Left Kidney: Length: 10.0 cm as measured on this ultrasound, however portions of the left kidney were partially obscured likely resulting in inaccurate measurement. The recent CT demonstrates more symmetric renal size. Echogenicity within normal limits. No mass or hydronephrosis visualized. Bladder: Decompressed by Foley catheter. Partially visualized complex ascites. IMPRESSION: 1. No hydronephrosis. 2. Ascites. Electronically Signed   By: Sebastian AcheAllen  Grady M.D.   On: 06/01/2017 13:01   Ir Paracentesis  Result Date: 06/01/2017 INDICATION: Patient with history of cirrhosis, recurrent ascites. Request is made for diagnostic and therapeutic paracentesis of up to 2.0 L. EXAM: ULTRASOUND GUIDED DIAGNOSTIC AND THERAPEUTIC PARACENTESIS  MEDICATIONS:  10 mL 2% lidocaine COMPLICATIONS: None immediate. PROCEDURE: Informed written consent was obtained from the patient after a discussion of the risks, benefits and alternatives to treatment. A timeout was performed prior to the initiation of the procedure. Initial ultrasound scanning demonstrates a large amount of ascites within the right lateral abdomen. The right lateral abdomen was prepped and draped in the usual sterile fashion. 2% lidocaine was used for local anesthesia. Following this, a 19 gauge, 7-cm, Yueh catheter was introduced. An ultrasound image was saved for documentation purposes. The paracentesis was performed. The catheter was removed and a dressing was applied. The patient tolerated the procedure well without immediate post procedural complication. FINDINGS: A total of approximately 2.0 liters of yellow fluid was removed. Samples were sent to the laboratory as requested by the clinical team. IMPRESSION: Successful ultrasound-guided diagnostic and therapeutic paracentesis yielding 2.0 liters of peritoneal fluid. Read by:  Loyce Dys PA-C Electronically Signed   By: Gilmer Mor D.O.   On: 06/01/2017 13:51    Assessment/Plan:  Acute oliguric renal failure with a baseline creatinine of 0.5  She was admitted with possible spontaneous bacterial peritonitis and has had many paracentesis draws for her cirrhosis. She has had an increase in creatinine. 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 It appears that the creatinine has continued to increase. Differential would include acute tubular necrosis  Ischemic from shock and low blood pressure and acute interstitial nephritis, Wee shall send a urinalysis for sodium and for analysis.  Volume   Massive volume resuscitation with total of nearly 10 L administered   Electrolytes stable   Acid Base stable  Dialysis would be indicated as this appears to a bridge  to liver transplant - it appears that hepatology has been called at [facility] and patient is awaiting transfer   LOS: 3 Sherisa Gilvin W @TODAY @5 :20 PM

## 2017-06-01 NOTE — Procedures (Signed)
PROCEDURE SUMMARY:  Successful US guided paracentesis from right lateral abdomen.  Yielded 2.0 liters of yellow fluid.  No immediate complications.  Pt tolerated well.   Specimen was sent for labs.  Hoyt KochKacie Sue-Ellen Valerie Cones PA-C 06/01/2017 1:49 PM

## 2017-06-01 NOTE — Progress Notes (Signed)
Back from radiology by bed awake and alert. 

## 2017-06-01 NOTE — Progress Notes (Signed)
Order for foley catheter received.

## 2017-06-01 NOTE — Progress Notes (Signed)
[facility] Progress Note  Chief Complaint:    End stage renal disease  Subjective: Having severe pain in both legs, L> R from the swelling. Some abdominal pain but mainly leg pain  Objective:  Vital signs in last 24 hours: Temp:  [97.2 F (36.2 C)-97.6 F (36.4 C)] 97.5 F (36.4 C) (11/09 0749) Pulse Rate:  [114-123] 119 (11/09 0750) Resp:  [15-23] 18 (11/09 0750) BP: (103-115)/(51-60) 108/51 (11/09 0750) SpO2:  [90 %-100 %] 98 % (11/09 0750) Weight:  [157 lb 13.6 oz (71.6 kg)] 157 lb 13.6 oz (71.6 kg) (11/09 0600) Last BM Date: 05/29/17 General:   Alert, white female lying in bed having significant pain in legs Heart:  Regular rate and rhythm, + pitting BLE.  Pulm: Normal respiratory effort Abdomen:  Soft, largely distended, mild diffuse tenderness.  Normal bowel sounds   Neurologic:  Alert and  oriented x4;  grossly normal neurologically. Psych:  Pleasant, cooperative.     Intake/Output from previous day: 11/08 0701 - 11/09 0700 In: 1171.3 [P.O.:240; I.V.:931.3] Out: 25 [Urine:25] Intake/Output this shift: No intake/output data recorded.  Lab Results: Recent Labs    05/30/17 1242 06/01/17 0528  WBC 18.3* 17.6*  HGB 7.8* 8.7*  HCT 23.1* 26.5*  PLT 154 192   BMET Recent Labs    05/30/17 1242 05/31/17 0313 06/01/17 0528  NA 126* 127* 127*  K 4.9 5.1 5.0  CL 97* 98* 100*  CO2 21* 21* 16*  GLUCOSE 99 120* 78  BUN 31* 32* 40*  CREATININE 2.64* 2.86* 3.52*  CALCIUM 8.2* 8.2* 8.8*   LFT Recent Labs    06/01/17 0528  PROT 6.6  ALBUMIN 2.8*  AST 44*  ALT 16  ALKPHOS 48  BILITOT 8.2*   PT/INR Recent Labs    06/01/17 0528  LABPROT 28.3*  INR 2.68   Hepatitis Panel No results for input(s): HEPBSAG, HCVAB, HEPAIGM, HEPBIGM in the last 72 hours.  Ir Paracentesis  Result Date: 05/30/2017 INDICATION: Patient with history of alcoholic cirrhosis and recurrent abdominal ascites. Patient admitted with abdominal pain and E coli  bacteremia. Request is made for diagnostic paracentesis. EXAM: ULTRASOUND GUIDED DIAGNOSTIC PARACENTESIS MEDICATIONS: 2% lidocaine COMPLICATIONS: None immediate. PROCEDURE: Informed written consent was obtained from the patient after a discussion of the risks, benefits and alternatives to treatment. A timeout was performed prior to the initiation of the procedure. Initial ultrasound scanning demonstrates a large amount of ascites within the left lower abdominal quadrant. The left lower abdomen was prepped and draped in the usual sterile fashion. 2% lidocaine was used for local anesthesia. Following this, a 19 gauge, 7-cm, Yueh catheter was introduced. An ultrasound image was saved for documentation purposes. The paracentesis was performed. The catheter was removed and a dressing was applied. The patient tolerated the procedure well without immediate post procedural complication. FINDINGS: A total of approximately 120 cc of serous fluid was removed. Samples were sent to the laboratory as requested by the clinical team. IMPRESSION: Successful ultrasound-guided paracentesis yielding 120 cc of peritoneal fluid. Read by: Barnetta ChapelKelly Osborne, PA-C Electronically Signed   By: Jolaine ClickArthur  Hoss M.D.   On: 05/30/2017 13:39    ASSESSMENT / PLAN:   51 yo female with decompensated ETOH cirrhosis with large volume ascites / SBP / and HRS. Ascitic fluid culture results pending. Also with E.coli bacteremia and E.coli UTI.  WBC minimally improved at 17.6. Her renal function continues to decline. She has poor urine output. Having significant BLE pain from fluid  retention.  -[facility]. Will be accepted to Medicine Service and Dr. Woodfin GanjaJama Darling with Transplant Hepatology will follow.  -Can narrow antibiotics once ascitic fluid culture results are known.  -Getting IV albumin today.  -continue midodrine and octreotide (dose increased to 200mg  TID yesterday) -Pain management option limited with  renal and liver function. We are okay with her having a couple of liters taken off today. She has been getting IVF so not intravascularly depleted and BP is okay. Removing some of the fluid may help with her pain. Additionally, I'm contacting Dr. Hyman HopesWebb who saw patient today. From a liver standpoint we are okay giving he a very small amount of Morphine but I would like Renal's input      Principal Problem:   SIRS (systemic inflammatory response syndrome) (HCC) Active Problems:   Esophageal varices in alcoholic cirrhosis (HCC)   ARF (acute renal failure) (HCC)   Anemia   Decompensated hepatic cirrhosis (HCC)   Hyponatremia   SBP (spontaneous bacterial peritonitis) (HCC)    LOS: 3 days   Willette ClusterPaula Damion Kant ,NP 06/01/2017, 10:04 AM  Pager number [phone]

## 2017-06-01 NOTE — Discharge Summary (Addendum)
Discharge Summary  Tina Haley ZOX:[id number] DOB: October 04, 1965  PCP: Tina Pearson, Haley  Admit date: 05/29/2017 Discharge date: 06/01/2017  Time spent: 25 minutes  Recommendations for Outpatient Follow-up:  1. Transfer to [facility] 2. Accepting physician Dr. Chip Haley, Hepatologist to Medical Stepdown (Intermediate) 06/01/17. 3. Patient on waiting list for a bed at [facility].  Discharge Diagnoses:  Active Hospital Problems   Diagnosis Date Noted  . SIRS (systemic inflammatory response syndrome) (HCC) 05/29/2017  . SBP (spontaneous bacterial peritonitis) (HCC)   . ARF (acute renal failure) (HCC) 05/29/2017  . Anemia 05/29/2017  . Decompensated hepatic cirrhosis (HCC) 05/29/2017  . Hyponatremia 05/29/2017  . Esophageal varices in alcoholic cirrhosis [facility])     Resolved Hospital Problems  No resolved problems to display.    Discharge Condition: Intermediate stable   Diet recommendation: Low salt diet  Vitals:   06/01/17 0749 06/01/17 0750  BP:  (!) 108/51  Pulse:  (!) 119  Resp:  18  Temp: (!) 97.5 F (36.4 C)   SpO2:  98%    History of present illness:  Tina Haley is a 51 y.o. female with with history of alcoholic liver cirrhosis with esophageal varices status post banding a month ago also has been having recurrent paracentesis for ascites last one was 10 days ago presents with abdominal pain distention over the last 3 days with poor appetite and nausea.  Denies any fever chills chest pain or shortness of breath.  Patient states he has been taking his spironolactone but held off of Lasix as instructed by the physician.  Pain is mostly in the flank area patient states she has been making urine denies any diarrhea.  ED Course: In the ER patient is found to be tachycardic on exam patient has distended abdomen and bilateral lower extremity edema.  Patient remained afebrile.  Patient's INR is elevated at 2.8 and lactic acid was elevated at 3.6  following and the heart rate was in the 130s sinus tachycardia and creatinine was 2.4.  UA shows large amount of leukocyte esterase with squamous epithelial cells and too numerous to count WBC and few bacteria.  Blood cultures and urine cultures were obtained patient was started on IV fluids sepsis protocol due to renal failure and possible sepsis.  Patient was started on empiric antibiotics.  Blood culture (05/29/17) positive for e-coli x 2. Positive U/A with urine culture growing e-coli. Antibiotics switched to Cefepime since cefotaxime not available. Body fluid no growth. MRSA negative.   Worsening renal function with oliguria, suspecting hepatorenal syndrome. Consulted nephrology who recommended transfer to [facility].  Call made for transfer to [facility]. Dr. Piedad Haley accepted the patient however patient is on the waiting list. [facility] will call when bed is made available.   Hospital Course:  Principal Problem:   SIRS (systemic inflammatory response syndrome) (HCC) Active Problems:   Esophageal varices in alcoholic cirrhosis (HCC)   ARF (acute renal failure) (HCC)   Anemia   Decompensated hepatic cirrhosis (HCC)   Hyponatremia   SBP (spontaneous bacterial peritonitis) (HCC)   Procedures:  Paracentesis  Consultations: 1. GI 2. Nephrology  Discharge Exam: BP (!) 108/51 (BP Location: Right Arm)   Pulse (!) 119   Temp (!) 97.5 F (36.4 C) (Oral)   Resp 18   Ht 5\' 3"  (1.6 m)   Wt 71.6 kg (157 lb 13.6 oz)   SpO2 98%   BMI 27.96 kg/m    General:Thin built uncomfortable due  to back pain and severely distended abdomen  Cardiovascular:RRR no murmurs rubs or gallops  Respiratory:CTA no wheezes or rales  Abdomen:severely distended with fluid shift  Musculoskeletal:moves limbs freely. Lower extremities have 3+ pitting edema.  Skin:No open lesions  Psychiatry:Mood is anxious  Discharge Instructions You were cared for by a hospitalist during your hospital stay. If you  have any questions about your discharge medications or the care you received while you were in the hospital after you are discharged, you can call the unit and asked to speak with the hospitalist on call if the hospitalist that took care of you is not available. Once you are discharged, your primary care physician will handle any further medical issues. Please note that NO REFILLS for any discharge medications will be authorized once you are discharged, as it is imperative that you return to your primary care physician (or establish a relationship with a primary care physician if you do not have one) for your aftercare needs so that they can reassess your need for medications and monitor your lab values.   Allergies as of 06/01/2017   No Known Allergies     Medication List    TAKE these medications   furosemide 40 MG tablet Commonly known as:  LASIX Take 40 mg daily as needed by mouth for fluid.   pantoprazole 40 MG tablet Commonly known as:  PROTONIX Take 1 tablet (40 mg total) by mouth daily.   prenatal multivitamin Tabs tablet Take 1 tablet by mouth daily.   spironolactone 100 MG tablet Commonly known as:  ALDACTONE Take 1 tablet (100 mg total) by mouth daily.      No Known Allergies Follow-up Information    Tina Haley Follow up.   Specialty:  Family Medicine Contact information: [address] [place] [address] [phone]            The results of significant diagnostics from this hospitalization (including imaging, microbiology, ancillary and laboratory) are listed below for reference.    Significant Diagnostic Studies: Ct Abdomen Pelvis Wo Contrast  Result Date: 05/29/2017 CLINICAL DATA:  Worsening abdominal distention and nausea. Alcoholic cirrhosis. EXAM: CT ABDOMEN AND PELVIS WITHOUT CONTRAST TECHNIQUE: Multidetector CT imaging of the abdomen and pelvis was performed following the standard protocol without IV contrast. COMPARISON:  Right upper  quadrant ultrasound on 02/01/2017 FINDINGS: Lower chest: No acute findings. Hepatobiliary: Hepatic cirrhosis is demonstrated. Liver has heterogeneous attenuation, and hepatic neoplasm cannot be excluded on this unenhanced exam. Gallbladder is unremarkable. Pancreas: No mass or inflammatory process visualized on this unenhanced exam. Spleen:  Within normal limits in size. Adrenals/Urinary tract: No evidence of urolithiasis or hydronephrosis. Unremarkable unopacified urinary bladder. Stomach/Bowel: No evidence of obstruction or focal inflammatory process. Vascular/Lymphatic: No pathologically enlarged lymph nodes identified. No evidence of abdominal aortic aneurysm. Abdominal portosystemic venous collaterals, consistent with portal venous hypertension Reproductive:  No mass or other significant abnormality. Other: Large amount of ascites seen within the abdomen and pelvis, as well as diffuse mesenteric and body wall edema. Musculoskeletal:  No suspicious bone lesions identified. IMPRESSION: Hepatic cirrhosis. Heterogeneous hepatic attenuation noted ; hepatocellular carcinoma cannot be excluded on this unenhanced exam. Consider further characterization with non-emergent abdomen MRI without and with contrast as the preferred exam. (Abd CT without and with contrast would be appropriate if patient has contraindication to MRI or cannot cooperate with breath-holding.) Findings of portal venous hypertension, with large amount of ascites. Electronically Signed   By: Myles RosenthalJohn  Stahl M.D.   On: 05/29/2017  10:11   Dg Chest Port 1 View  Result Date: 05/29/2017 CLINICAL DATA:  Shortness of breath, sepsis, and abdominal pain. EXAM: PORTABLE CHEST 1 VIEW COMPARISON:  01/07/2016 FINDINGS: Linear atelectasis in both lung bases. Heart size and pulmonary vascularity are normal. No consolidation in the lungs. No blunting of costophrenic angles. No pneumothorax. Mediastinal contours appear intact. IMPRESSION: Linear atelectasis in the  lung bases.  No focal consolidation. Electronically Signed   By: Burman Nieves M.D.   On: 05/29/2017 06:29   Ir Paracentesis  Result Date: 05/30/2017 INDICATION: Patient with history of alcoholic cirrhosis and recurrent abdominal ascites. Patient admitted with abdominal pain and E coli bacteremia. Request is made for diagnostic paracentesis. EXAM: ULTRASOUND GUIDED DIAGNOSTIC PARACENTESIS MEDICATIONS: 2% lidocaine COMPLICATIONS: None immediate. PROCEDURE: Informed written consent was obtained from the patient after a discussion of the risks, benefits and alternatives to treatment. A timeout was performed prior to the initiation of the procedure. Initial ultrasound scanning demonstrates a large amount of ascites within the left lower abdominal quadrant. The left lower abdomen was prepped and draped in the usual sterile fashion. 2% lidocaine was used for local anesthesia. Following this, a 19 gauge, 7-cm, Yueh catheter was introduced. An ultrasound image was saved for documentation purposes. The paracentesis was performed. The catheter was removed and a dressing was applied. The patient tolerated the procedure well without immediate post procedural complication. FINDINGS: A total of approximately 120 cc of serous fluid was removed. Samples were sent to the laboratory as requested by the clinical team. IMPRESSION: Successful ultrasound-guided paracentesis yielding 120 cc of peritoneal fluid. Read by: Barnetta Chapel, PA-C Electronically Signed   By: Jolaine Click M.D.   On: 05/30/2017 13:39   Ir Paracentesis  Result Date: 05/17/2017 INDICATION: Patient with history of alcoholic cirrhosis, recurrent ascites. Request is made for therapeutic paracentesis. Patient to receive albumin is volume removed is greater than 5 liters. EXAM: ULTRASOUND GUIDED DIAGNOSTIC AND THERAPEUTIC PARACENTESIS MEDICATIONS: 10 mL 1% lidocaine COMPLICATIONS: None immediate. PROCEDURE: Informed written consent was obtained from the  patient after a discussion of the risks, benefits and alternatives to treatment. A timeout was performed prior to the initiation of the procedure. Initial ultrasound scanning demonstrates a large amount of ascites within the right lateral abdomen. The right lateral abdomen was prepped and draped in the usual sterile fashion. 1% lidocaine was used for local anesthesia. Following this, a 19 gauge, 7-cm, Yueh catheter was introduced. An ultrasound image was saved for documentation purposes. The paracentesis was performed. The catheter was removed and a dressing was applied. The patient tolerated the procedure well without immediate post procedural complication. FINDINGS: A total of approximately 5.0 liters of yellow fluid was removed. Samples were sent to the laboratory as requested by the clinical team. IMPRESSION: Successful ultrasound-guided diagnostic and therapeutic paracentesis yielding 5.0 liters of peritoneal fluid. Read by:  Loyce Dys PA-C Electronically Signed   By: Simonne Come M.D.   On: 05/17/2017 13:42    Microbiology: Recent Results (from the past 240 hour(s))  Urine culture     Status: Abnormal   Collection Time: 05/29/17  2:54 AM  Result Value Ref Range Status   Specimen Description URINE, RANDOM  Final   Special Requests NONE  Final   Culture 80,000 COLONIES/mL ESCHERICHIA COLI (A)  Final   Report Status 05/31/2017 FINAL  Final   Organism ID, Bacteria ESCHERICHIA COLI (A)  Final      Susceptibility   Escherichia coli - MIC*    AMPICILLIN  8 SENSITIVE Sensitive     CEFAZOLIN <=4 SENSITIVE Sensitive     CEFTRIAXONE <=1 SENSITIVE Sensitive     CIPROFLOXACIN <=0.25 SENSITIVE Sensitive     GENTAMICIN <=1 SENSITIVE Sensitive     IMIPENEM <=0.25 SENSITIVE Sensitive     NITROFURANTOIN <=16 SENSITIVE Sensitive     TRIMETH/SULFA <=20 SENSITIVE Sensitive     AMPICILLIN/SULBACTAM <=2 SENSITIVE Sensitive     PIP/TAZO <=4 SENSITIVE Sensitive     Extended ESBL NEGATIVE Sensitive     *  80,000 COLONIES/mL ESCHERICHIA COLI  Blood culture (routine x 2)     Status: Abnormal   Collection Time: 05/29/17  3:30 AM  Result Value Ref Range Status   Specimen Description BLOOD RIGHT ARM  Final   Special Requests   Final    BOTTLES DRAWN AEROBIC AND ANAEROBIC Blood Culture results may not be optimal due to an excessive volume of blood received in culture bottles   Culture  Setup Time   Final    GRAM NEGATIVE RODS IN BOTH AEROBIC AND ANAEROBIC BOTTLES CRITICAL RESULT CALLED TO, READ BACK BY AND VERIFIED WITH: K PERKINS PHARMD 1947 05/29/17 A BROWNING    Culture (A)  Final    ESCHERICHIA COLI SUSCEPTIBILITIES PERFORMED ON PREVIOUS CULTURE WITHIN THE LAST 5 DAYS.    Report Status 05/31/2017 FINAL  Final  Blood culture (routine x 2)     Status: Abnormal   Collection Time: 05/29/17  3:52 AM  Result Value Ref Range Status   Specimen Description BLOOD LEFT ARM  Final   Special Requests   Final    IN PEDIATRIC BOTTLE Blood Culture results may not be optimal due to an excessive volume of blood received in culture bottles   Culture  Setup Time   Final    GRAM NEGATIVE RODS IN PEDIATRIC BOTTLE CRITICAL RESULT CALLED TO, READ BACK BY AND VERIFIED WITH: K PERKINS PHARMD 1947 05/29/17 A BROWNING    Culture ESCHERICHIA COLI (A)  Final   Report Status 05/31/2017 FINAL  Final   Organism ID, Bacteria ESCHERICHIA COLI  Final      Susceptibility   Escherichia coli - MIC*    AMPICILLIN <=2 SENSITIVE Sensitive     CEFAZOLIN <=4 SENSITIVE Sensitive     CEFEPIME <=1 SENSITIVE Sensitive     CEFTAZIDIME <=1 SENSITIVE Sensitive     CEFTRIAXONE <=1 SENSITIVE Sensitive     CIPROFLOXACIN <=0.25 SENSITIVE Sensitive     GENTAMICIN <=1 SENSITIVE Sensitive     IMIPENEM <=0.25 SENSITIVE Sensitive     TRIMETH/SULFA <=20 SENSITIVE Sensitive     AMPICILLIN/SULBACTAM <=2 SENSITIVE Sensitive     PIP/TAZO <=4 SENSITIVE Sensitive     Extended ESBL NEGATIVE Sensitive     * ESCHERICHIA COLI  Blood Culture ID  Panel (Reflexed)     Status: Abnormal   Collection Time: 05/29/17  3:52 AM  Result Value Ref Range Status   Enterococcus species NOT DETECTED NOT DETECTED Final   Listeria monocytogenes NOT DETECTED NOT DETECTED Final   Staphylococcus species NOT DETECTED NOT DETECTED Final   Staphylococcus aureus NOT DETECTED NOT DETECTED Final   Streptococcus species NOT DETECTED NOT DETECTED Final   Streptococcus agalactiae NOT DETECTED NOT DETECTED Final   Streptococcus pneumoniae NOT DETECTED NOT DETECTED Final   Streptococcus pyogenes NOT DETECTED NOT DETECTED Final   Acinetobacter baumannii NOT DETECTED NOT DETECTED Final   Enterobacteriaceae species DETECTED (A) NOT DETECTED Final    Comment: Enterobacteriaceae represent a large family of gram-negative bacteria,  not a single organism. CRITICAL RESULT CALLED TO, READ BACK BY AND VERIFIED WITH: K PERKINS PHARMD 1947 05/29/17 A BROWNING    Enterobacter cloacae complex NOT DETECTED NOT DETECTED Final   Escherichia coli DETECTED (A) NOT DETECTED Final    Comment: CRITICAL RESULT CALLED TO, READ BACK BY AND VERIFIED WITH: K PERKINS PHARMD 1947 05/29/17 A BROWNING    Klebsiella oxytoca NOT DETECTED NOT DETECTED Final   Klebsiella pneumoniae NOT DETECTED NOT DETECTED Final   Proteus species NOT DETECTED NOT DETECTED Final   Serratia marcescens NOT DETECTED NOT DETECTED Final   Carbapenem resistance NOT DETECTED NOT DETECTED Final   Haemophilus influenzae NOT DETECTED NOT DETECTED Final   Neisseria meningitidis NOT DETECTED NOT DETECTED Final   Pseudomonas aeruginosa NOT DETECTED NOT DETECTED Final   Candida albicans NOT DETECTED NOT DETECTED Final   Candida glabrata NOT DETECTED NOT DETECTED Final   Candida krusei NOT DETECTED NOT DETECTED Final   Candida parapsilosis NOT DETECTED NOT DETECTED Final   Candida tropicalis NOT DETECTED NOT DETECTED Final  MRSA PCR Screening     Status: None   Collection Time: 05/29/17  4:16 PM  Result Value Ref Range  Status   MRSA by PCR NEGATIVE NEGATIVE Final    Comment:        The GeneXpert MRSA Assay (FDA approved for NASAL specimens only), is one component of a comprehensive MRSA colonization surveillance program. It is not intended to diagnose MRSA infection nor to guide or monitor treatment for MRSA infections.   Gram stain     Status: None   Collection Time: 05/30/17  1:37 PM  Result Value Ref Range Status   Specimen Description FLUID PERITONEAL  Final   Special Requests NONE  Final   Gram Stain   Final    WBC PRESENT,BOTH PMN AND MONONUCLEAR NO ORGANISMS SEEN CYTOSPIN SMEAR    Report Status 05/30/2017 FINAL  Final  Culture, body fluid-bottle     Status: None (Preliminary result)   Collection Time: 05/30/17  1:37 PM  Result Value Ref Range Status   Specimen Description FLUID PERITONEAL  Final   Special Requests BOTTLES DRAWN AEROBIC AND ANAEROBIC  Final   Culture NO GROWTH < 24 HOURS  Final   Report Status PENDING  Incomplete     Labs: Basic Metabolic Panel: Recent Labs  Lab 05/28/17 2205 05/29/17 0949 05/30/17 1242 05/31/17 0313 06/01/17 0528  NA 122* 124* 126* 127* 127*  K 5.1 5.7* 4.9 5.1 5.0  CL 88* 94* 97* 98* 100*  CO2 20* 21* 21* 21* 16*  GLUCOSE 108* 99 99 120* 78  BUN 23* 25* 31* 32* 40*  CREATININE 2.49* 2.50* 2.64* 2.86* 3.52*  CALCIUM 9.0 8.2* 8.2* 8.2* 8.8*   Liver Function Tests: Recent Labs  Lab 05/28/17 2205 05/29/17 0949 05/30/17 1242 06/01/17 0528  AST 54* 42* 36 44*  ALT 23 17 13* 16  ALKPHOS 91 68 52 48  BILITOT 10.8* 9.2* 8.3* 8.2*  PROT 7.7 6.8 6.7 6.6  ALBUMIN 2.2* 1.9* 2.3* 2.8*   Recent Labs  Lab 05/28/17 2205  LIPASE 22   No results for input(s): AMMONIA in the last 168 hours. CBC: Recent Labs  Lab 05/28/17 2205 05/29/17 0949 05/30/17 1242 06/01/17 0528  WBC 23.0* 21.1* 18.3* 17.6*  NEUTROABS 18.8*  --   --   --   HGB 9.6* 8.4* 7.8* 8.7*  HCT 29.0* 25.0* 23.1* 26.5*  MCV 116.0* 115.2* 115.5* 117.8*  PLT 254 181  154  192   Cardiac Enzymes: No results for input(s): CKTOTAL, CKMB, CKMBINDEX, TROPONINI in the last 168 hours. BNP: BNP (last 3 results) No results for input(s): BNP in the last 8760 hours.  ProBNP (last 3 results) No results for input(s): PROBNP in the last 8760 hours.  CBG: Recent Labs  Lab 05/31/17 0529 05/31/17 1219 05/31/17 1815 05/31/17 2329 06/01/17 0358  GLUCAP 114* 88 79 87 76    1. Sepsis 2/2 to e-coli bacteremia, e-coli UTI, suspected SBP -IV cefepime 2gm IV daily empirically for SBP  -body fluid no growth in 24 hr, in process -Follows with [facility] -Arrange for transfer; Hepatologist Dr Tina Haley  2. Decompensated liver cirrhosis -GI following; we appreciate recs -octreotide, midodrine, albumin, -hold off patient's spironolactone due to acute renal failure,hyponatremia and hypotension. 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 -Follow PT/INR will order vitamin K when planning procedure- Transfer to [facility] initiated. -INR 2.86 (05/29/17), INR 2.68 (06/01/17) -hold heparin or other anticoagulants  3. Hepatorenal syndrome -oliguria with worsening creatinine cr 3.52 from 2.86, 2.64 -IV fluid boluses and albumin -avoid nephrotoxic agents -Renal ultrasound, urine lytes -Nephrology contacted and recommended transfer to [facility]  4. Macrocyticanemia -Hg 8.7 -transfuse Hg less than 7  5. Hyponatremia -improving Na+ 127 (124) -IV fluid -repeat BMP every 4 hours -hold spironolactone  Patient will be transferred to [facility] for ongoing care and transplant evaluation.  Signed:  Darlin Drop, Haley [facility] 06/01/2017, 10:21 AM

## 2017-06-01 NOTE — Care Management Note (Signed)
Case Management Note  Patient Details  Name: Onnie BoerKathy A Bohlman MRN: [id number] Date of Birth: 1966/05/09  Subjective/Objective:   Pt admitted with SIRS - alcoholic cirrhosis - recurrent problem with periodic paracentesis - suffered hypotension post paracentesis              Action/Plan:  PTA from home with husband.   CM will continue to follow for discharge needs   Expected Discharge Date:  06/01/17               Expected Discharge Plan:     In-House Referral:     Discharge planning Services  CM Consult  Post Acute Care Choice:    Choice offered to:     DME Arranged:    DME Agency:     HH Arranged:    HH Agency:     Status of Service:     If discussed at [employer] Meetings, dates discussed:    Additional Comments: [facility] - attending and unit staff will facilitate transfer Cherylann ParrClaxton, Jaylnn Ullery S, RN 06/01/2017, 10:49 AM

## 2017-06-01 NOTE — Discharge Instructions (Signed)
Alcoholic Liver Disease The liver is an organ that converts food into energy, absorbs vitamins from food, removes toxins from the blood, and makes proteins. Alcoholic liver disease happens when the liver becomes damaged due to alcohol consumption and it stops working properly. What are the causes? This condition is caused by drinking too much alcohol over a number of years. What increases the risk? This condition is more likely to develop in:  Women.  People who have a family history of the disease.  People who have poor nutrition.  People who are obese.  What are the signs or symptoms? Early symptoms of this condition include:  Fatigue.  Weakness.  Abdominal discomfort on the upper right side.  Symptoms of moderate disease include:  Fever.  Yellow, pale, or darkening skin.  Abdominal pain.  Nausea and vomiting.  Weight loss.  Loss of appetite.  Symptoms of advanced disease include:  Abdominal swelling.  Nosebleeds or bleeding gums.  Itchy skin.  Enlarged fingertips (clubbing).  Light-colored stools that smell very bad (steatorrhea).  Mood changes.  Feeling agitated.  Confusion.  Trouble concentrating.  Some people do not have symptoms until the condition becomes severe. Symptoms are often worse after heavy drinking. How is this diagnosed? This condition is diagnosed with:  A physical exam.  Blood tests.  Taking a sample of liver tissue to examine under a microscope (liver biopsy).  You may also have other tests, including:  X-rays.  Ultrasound.  How is this treated? Treatment may include:  Stopping alcohol use to allow the liver to heal.  Joining a support group or meeting with a counselor.  Medicines to reduce inflammation. These may be recommended if the disease is severe.  A liver transplant. This is the only treatment if the disease is very severe.  Nutritional therapy. This may involve: ? Taking vitamins. ? Eating foods that  are high in thiamine, such as whole-wheat cereals, pork, and raw vegetables. ? Eating foods that have a lot of folic acid, such as vegetables, fruits, meats, beans, nuts, and dairy foods. ? Eating a diet that includes carbohydrate-rich foods, such as yogurt, beans, potatoes, and rice.  Follow these instructions at home:  Do not drink alcohol.  Take medicines only as directed by your health care provider.  Take vitamins only as directed by your health care provider.  Follow any diet instructions that are given to you by your health care provider. Contact a health care provider if:  You have a fever.  You have shortness of breath or have difficulty breathing.  You have bright red blood in your stool, or you have black, tarry stools.  You are vomiting blood.  Your skin color becomes more yellow, pale, or dark.  You develop headaches.  You have trouble thinking.  You have problems balancing or walking. This information is not intended to replace advice given to you by your health care provider. Make sure you discuss any questions you have with your health care provider. Document Released: 07/31/2014 Document Revised: 12/16/2015 Document Reviewed: 06/11/2014 Elsevier Interactive Patient Education  2018 [employer]   Alcoholic Liver Disease Alcoholic liver disease happens when the liver does not work the way it should. The condition is caused by drinking too much alcohol for many years. Follow these instructions at home:  Do not drink alcohol.  Take medicines only as told by your doctor.  Take vitamins only as told by your doctor.  Follow any diet instructions that your doctor gave you. You may  need to: ? Eat foods that have thiamine. These include whole-wheat cereals, pork, and raw vegetables. ? Eat foods that have folic acid. These include vegetables, fruits, meats, beans, nuts, and dairy foods. ? Eat foods that are high in carbohydrates. These include yogurt, beans,  potatoes, and rice. Contact a doctor if:  You have a fever.  You are short of breath.  You have trouble breathing.  You have bright red blood in your poop (stool).  Your poop looks like tar.  You throw up (vomit) blood.  Your skin looks more yellow, pale, or dark.  You get headaches.  You have trouble thinking.  You have trouble balancing or walking. This information is not intended to replace advice given to you by your health care provider. Make sure you discuss any questions you have with your health care provider. Document Released: 05/07/2009 Document Revised: 12/16/2015 Document Reviewed: 06/11/2014 Elsevier Interactive Patient Education  [employer]2018 Elsevier Inc.

## 2017-06-01 NOTE — Progress Notes (Signed)
Transported to radiology for paracentesis by bed.

## 2017-06-01 NOTE — Progress Notes (Signed)
Md aware about not putting out any urine, continue to monitor.

## 2017-06-01 NOTE — Progress Notes (Signed)
I spoke to [facility] Dr. Chip BoerJ. Darling regarding overall patient condition She feels/recommends the patient be transferred there for ongoing care and transplant evaluation. She is early in the transplant process to this point, but given severity of her acute on chronic illness the transplant evaluation and decision process will need to be expedited. Dr. Piedad Climesarling recommended we continue our current treatment and they will transfer the patient there when bed available.  Hopefully later today or tomorrow. Will go to medical stepdown (intermediate) bed.  We will see patient later this morning for assessment.

## 2017-06-01 NOTE — Progress Notes (Signed)
I paged Dr. Bruna PotterBlount r/t the patients increase in pain to see if we could get her something other than the prescribed acetaminophen. I also paged Dr. Bruna PotterBlount r/t the patient not voiding for me. I asked if she wanted a foley placed. Awaiting orders. Will continue to monitor.

## 2017-06-02 LAB — GLUCOSE, CAPILLARY
Glucose-Capillary: 125 mg/dL — ABNORMAL HIGH (ref 65–99)
Glucose-Capillary: 56 mg/dL — ABNORMAL LOW (ref 65–99)
Glucose-Capillary: 59 mg/dL — ABNORMAL LOW (ref 65–99)
Glucose-Capillary: 70 mg/dL (ref 65–99)
Glucose-Capillary: 87 mg/dL (ref 65–99)
Glucose-Capillary: 94 mg/dL (ref 65–99)

## 2017-06-02 LAB — CBC
HCT: 25.3 % — ABNORMAL LOW (ref 36.0–46.0)
Hemoglobin: 8.4 g/dL — ABNORMAL LOW (ref 12.0–15.0)
MCH: 39.4 pg — ABNORMAL HIGH (ref 26.0–34.0)
MCHC: 33.2 g/dL (ref 30.0–36.0)
MCV: 118.8 fL — ABNORMAL HIGH (ref 78.0–100.0)
Platelets: 125 10*3/uL — ABNORMAL LOW (ref 150–400)
RBC: 2.13 MIL/uL — ABNORMAL LOW (ref 3.87–5.11)
RDW: 19.7 % — ABNORMAL HIGH (ref 11.5–15.5)
WBC: 16.3 10*3/uL — ABNORMAL HIGH (ref 4.0–10.5)

## 2017-06-02 LAB — COMPREHENSIVE METABOLIC PANEL
ALT: 13 U/L — ABNORMAL LOW (ref 14–54)
AST: 50 U/L — ABNORMAL HIGH (ref 15–41)
Albumin: 3.1 g/dL — ABNORMAL LOW (ref 3.5–5.0)
Alkaline Phosphatase: 65 U/L (ref 38–126)
Anion gap: 14 (ref 5–15)
BUN: 41 mg/dL — ABNORMAL HIGH (ref 6–20)
CO2: 13 mmol/L — ABNORMAL LOW (ref 22–32)
Calcium: 8.9 mg/dL (ref 8.9–10.3)
Chloride: 102 mmol/L (ref 101–111)
Creatinine, Ser: 3.81 mg/dL — ABNORMAL HIGH (ref 0.44–1.00)
GFR calc Af Amer: 15 mL/min — ABNORMAL LOW (ref >60)
GFR calc non Af Amer: 13 mL/min — ABNORMAL LOW (ref >60)
Glucose, Bld: 55 mg/dL — ABNORMAL LOW (ref 65–99)
Potassium: 5.6 mmol/L — ABNORMAL HIGH (ref 3.5–5.1)
Sodium: 129 mmol/L — ABNORMAL LOW (ref 135–145)
Total Bilirubin: 8.7 mg/dL — ABNORMAL HIGH (ref 0.3–1.2)
Total Protein: 7.3 g/dL (ref 6.5–8.1)

## 2017-06-02 LAB — RENAL FUNCTION PANEL
Albumin: 2.8 g/dL — ABNORMAL LOW (ref 3.5–5.0)
Anion gap: 11 (ref 5–15)
BUN: 46 mg/dL — ABNORMAL HIGH (ref 6–20)
CO2: 14 mmol/L — ABNORMAL LOW (ref 22–32)
Calcium: 8.5 mg/dL — ABNORMAL LOW (ref 8.9–10.3)
Chloride: 101 mmol/L (ref 101–111)
Creatinine, Ser: 3.85 mg/dL — ABNORMAL HIGH (ref 0.44–1.00)
GFR calc Af Amer: 15 mL/min — ABNORMAL LOW (ref >60)
GFR calc non Af Amer: 13 mL/min — ABNORMAL LOW (ref >60)
Glucose, Bld: 66 mg/dL (ref 65–99)
Phosphorus: 6.4 mg/dL — ABNORMAL HIGH (ref 2.5–4.6)
Potassium: 5.4 mmol/L — ABNORMAL HIGH (ref 3.5–5.1)
Sodium: 126 mmol/L — ABNORMAL LOW (ref 135–145)

## 2017-06-02 LAB — PROTIME-INR
INR: 3.14
Prothrombin Time: 32 seconds — ABNORMAL HIGH (ref 11.4–15.2)

## 2017-06-02 MED ORDER — DEXTROSE 50 % IV SOLN
25.0000 mL | Freq: Once | INTRAVENOUS | Status: AC
  Administered 2017-06-02: 50 mL via INTRAVENOUS
  Administered 2017-06-02: 25 mL via INTRAVENOUS

## 2017-06-02 MED ORDER — DEXTROSE 50 % IV SOLN
INTRAVENOUS | Status: AC
  Administered 2017-06-02: 50 mL
  Filled 2017-06-02: qty 50

## 2017-06-02 MED ORDER — SODIUM POLYSTYRENE SULFONATE 15 GM/60ML PO SUSP
15.0000 g | Freq: Once | ORAL | Status: DC
  Filled 2017-06-02: qty 60

## 2017-06-02 MED ORDER — DEXTROSE 50 % IV SOLN
INTRAVENOUS | Status: AC
Start: 2017-06-02 — End: 2017-06-02
  Administered 2017-06-02: 50 mL via INTRAVENOUS
  Filled 2017-06-02: qty 50

## 2017-06-02 MED ORDER — LACTULOSE ENEMA
300.0000 mL | Freq: Once | ORAL | Status: AC
  Administered 2017-06-02: 300 mL via RECTAL
  Filled 2017-06-02: qty 300

## 2017-06-02 MED ORDER — STERILE WATER FOR INJECTION IV SOLN
INTRAVENOUS | Status: DC
  Administered 2017-06-02 (×2): via INTRAVENOUS
  Filled 2017-06-02 (×5): qty 850

## 2017-06-02 NOTE — Progress Notes (Signed)
Recheck of blood sugar was 125

## 2017-06-02 NOTE — Progress Notes (Signed)
Patient ID: Tina BoerKathy A Johanning, female   DOB: Oct 23, 1965, 51 y.o.   MRN: [id number]     Progress Note   Subjective   Husband at bedside, states patient rested quietly all night after fentanyl about 3 AM. Nurse reports patient to sleepy to take any oral meds including Kayexalate. Renal has discussed initiating dialysis which is to start today   Objective   Vital signs in last 24 hours: Temp:  [97.5 F (36.4 C)-97.6 F (36.4 C)] 97.5 F (36.4 C) (11/10 0749) Pulse Rate:  [112-121] 112 (11/10 0750) Resp:  [14-21] 14 (11/10 0750) BP: (90-113)/(45-61) 97/48 (11/10 0750) SpO2:  [94 %-98 %] 94 % (11/10 0750) Last BM Date: 05/30/17 General:   Chronically ill-appearing white female jaundiced opens eyes to voice and moans with exam, not conversant Heart:  Regular rate and rhythm; no murmurs Lungs: Respirations even and unlabored, l decreased breath sounds bilaterally bases Abdomen:  Massive ascites remains tense, tender diffusely bowel sounds present but hypoactive, there is a lot of fluid in the soft tissues of the abdominal wall as well. Extremities: 2+ edema bilateral lower extremities above the knee Neurologic:  Somnolent, nonconversant today, opens eyes to exam Psych:  Cooperative.   Intake/Output from previous day: 11/09 0701 - 11/10 0700 In: 2550 [I.V.:1500; IV Piggyback:1050] Out: 25 [Urine:25] Intake/Output this shift: Total I/O In: 301.3 [I.V.:301.3] Out: -   Lab Results: Recent Labs    05/30/17 1242 06/01/17 0528 06/02/17 0338  WBC 18.3* 17.6* 16.3*  HGB 7.8* 8.7* 8.4*  HCT 23.1* 26.5* 25.3*  PLT 154 192 125*   BMET Recent Labs    05/31/17 0313 06/01/17 0528 06/02/17 0338  NA 127* 127* 129*  K 5.1 5.0 5.6*  CL 98* 100* 102  CO2 21* 16* 13*  GLUCOSE 120* 78 55*  BUN 32* 40* 41*  CREATININE 2.86* 3.52* 3.81*  CALCIUM 8.2* 8.8* 8.9   LFT Recent Labs    06/02/17 0338  PROT 7.3  ALBUMIN 3.1*  AST 50*  ALT 13*  ALKPHOS 65  BILITOT 8.7*    PT/INR Recent Labs    06/01/17 0528 06/02/17 0338  LABPROT 28.3* 32.0*  INR 2.68 3.14    Studies/Results: [facility]  Result Date: 06/01/2017 CLINICAL DATA:  Acute kidney injury.  History of cirrhosis. EXAM: RENAL / URINARY TRACT ULTRASOUND COMPLETE COMPARISON:  CT abdomen and pelvis 05/29/2017 FINDINGS: Examination was mildly limited by patient pain. Right Kidney: Length: 12.6 cm. Echogenicity within normal limits. No mass or hydronephrosis visualized. Left Kidney: Length: 10.0 cm as measured on this ultrasound, however portions of the left kidney were partially obscured likely resulting in inaccurate measurement. The recent CT demonstrates more symmetric renal size. Echogenicity within normal limits. No mass or hydronephrosis visualized. Bladder: Decompressed by Foley catheter. Partially visualized complex ascites. IMPRESSION: 1. No hydronephrosis. 2. Ascites. Electronically Signed   By: Sebastian AcheAllen  Grady M.D.   On: 06/01/2017 13:01   Ir Paracentesis  Result Date: 06/01/2017 INDICATION: Patient with history of cirrhosis, recurrent ascites. Request is made for diagnostic and therapeutic paracentesis of up to 2.0 L. EXAM: ULTRASOUND GUIDED DIAGNOSTIC AND THERAPEUTIC PARACENTESIS MEDICATIONS: 10 mL 2% lidocaine COMPLICATIONS: None immediate. PROCEDURE: Informed written consent was obtained from the patient after a discussion of the risks, benefits and alternatives to treatment. A timeout was performed prior to the initiation of the procedure. Initial ultrasound scanning demonstrates a large amount of ascites within the right lateral abdomen. The right lateral abdomen was prepped and draped in the usual  sterile fashion. 2% lidocaine was used for local anesthesia. Following this, a 19 gauge, 7-cm, Yueh catheter was introduced. An ultrasound image was saved for documentation purposes. The paracentesis was performed. The catheter was removed and a dressing was applied. The patient tolerated the procedure  well without immediate post procedural complication. FINDINGS: A total of approximately 2.0 liters of yellow fluid was removed. Samples were sent to the laboratory as requested by the clinical team. IMPRESSION: Successful ultrasound-guided diagnostic and therapeutic paracentesis yielding 2.0 liters of peritoneal fluid. Read by:  Loyce DysKacie Matthews PA-C Electronically Signed   By: Gilmer MorJaime  Wagner D.O.   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 She received IV albumin yesterday She is being followed by nephrology and plan is to start dialysis today Renal parameters worse today  #3 hyperkalemia-to be corrected with bicarbonate patient not taking by mouth's  Plan; continue aggressive supportive management Start dialysis today as per nephrology Continue Maxipime Continue very judicious use of narcotics for severe pain [facility] occur this weekend for transplant evaluation and hopefully liver transplant Has been very appreciative of the care she has been receiving.   Contact  Amy Esterwood, P.A.-C               [phone]      Principal Problem:   SIRS (systemic inflammatory response syndrome) (HCC) Active Problems:   Esophageal varices in alcoholic cirrhosis (HCC)   ARF (acute renal failure) (HCC)   Anemia   Decompensated hepatic cirrhosis (HCC)   Hyponatremia   SBP (spontaneous bacterial peritonitis) (HCC)     LOS: 4 days   Amy Esterwood  06/02/2017, 10:30 AM

## 2017-06-02 NOTE — Progress Notes (Signed)
Pt seen and examined at the bedside, husband at the bedside. Discharge summary completed 06/01/2017, please refer to discharge summary done 11/9. No changes in medication ssince 06/01/2017. Awaiting bed placement.  Tina Haley [facility] [phone]

## 2017-06-02 NOTE — Progress Notes (Signed)
Blount called and is on the floor to see pt at this time. Blount states she will contact CCM to follow pt. Melinda from [employer] on the floor to assess the pt as well. [facility]. Blount informed that pt family states before pt came to the hospital she was communicating with them and was more alert. Pt is not alert at this time. At minimum sometimes pt will open eyes when you state her name. Will stay in contact with physician's.. Will look for new orders. Will continue to monitor closely. Pts husband at bedside. Will continue to monitor.

## 2017-06-02 NOTE — Progress Notes (Signed)
Subjective: Patient somnolent, per husband her pain has been controlled with fentanyl however it has sedated her quite a bit. She is unable to eat or drink due to pain and therefore did not get kayexalate this morning. She has not had a BM in several days.  Objective: Vital signs in last 24 hours: Temp:  [97.5 F (36.4 C)-97.6 F (36.4 C)] 97.5 F (36.4 C) (11/10 0749) Pulse Rate:  [112-121] 112 (11/10 0750) Resp:  [14-21] 14 (11/10 0750) BP: (90-113)/(45-61) 97/48 (11/10 0750) SpO2:  [94 %-98 %] 94 % (11/10 0750) Weight change:   Intake/Output from previous day: 11/09 0701 - 11/10 0700 In: 2550 [I.V.:1500; IV Piggyback:1050] Out: 25 [Urine:25] Intake/Output this shift: Total I/O In: 301.3 [I.V.:301.3] Out: -   General appearance: no distress and ill appearing Resp: clear to auscultation bilaterally Cardio: tachycardic, regular rhythm GI: abnormal findings:  ascites and distended Extremities: edema 3+ Neurologic: Mental status: alertness: lethargic  Lab Results: Recent Labs    06/01/17 0528 06/02/17 0338  WBC 17.6* 16.3*  HGB 8.7* 8.4*  HCT 26.5* 25.3*  PLT 192 125*   BMET:  Recent Labs    06/01/17 0528 06/02/17 0338  NA 127* 129*  K 5.0 5.6*  CL 100* 102  CO2 16* 13*  GLUCOSE 78 55*  BUN 40* 41*  CREATININE 3.52* 3.81*  CALCIUM 8.8* 8.9   No results for input(s): PTH in the last 72 hours. Iron Studies: No results for input(s): IRON, TIBC, TRANSFERRIN, FERRITIN in the last 72 hours.  Studies/Results: [place]  Result Date: 06/01/2017 CLINICAL DATA:  Acute kidney injury.  History of cirrhosis. EXAM: RENAL / URINARY TRACT ULTRASOUND COMPLETE COMPARISON:  CT abdomen and pelvis 05/29/2017 FINDINGS: Examination was mildly limited by patient pain. Right Kidney: Length: 12.6 cm. Echogenicity within normal limits. No mass or hydronephrosis visualized. Left Kidney: Length: 10.0 cm as measured on this ultrasound, however portions of the left kidney were partially  obscured likely resulting in inaccurate measurement. The recent CT demonstrates more symmetric renal size. Echogenicity within normal limits. No mass or hydronephrosis visualized. Bladder: Decompressed by Foley catheter. Partially visualized complex ascites. IMPRESSION: 1. No hydronephrosis. 2. Ascites. Electronically Signed   By: Sebastian AcheAllen  Grady M.D.   On: 06/01/2017 13:01   Ir Paracentesis  Result Date: 06/01/2017 INDICATION: Patient with history of cirrhosis, recurrent ascites. Request is made for diagnostic and therapeutic paracentesis of up to 2.0 L. EXAM: ULTRASOUND GUIDED DIAGNOSTIC AND THERAPEUTIC PARACENTESIS MEDICATIONS: 10 mL 2% lidocaine COMPLICATIONS: None immediate. PROCEDURE: Informed written consent was obtained from the patient after a discussion of the risks, benefits and alternatives to treatment. A timeout was performed prior to the initiation of the procedure. Initial ultrasound scanning demonstrates a large amount of ascites within the right lateral abdomen. The right lateral abdomen was prepped and draped in the usual sterile fashion. 2% lidocaine was used for local anesthesia. Following this, a 19 gauge, 7-cm, Yueh catheter was introduced. An ultrasound image was saved for documentation purposes. The paracentesis was performed. The catheter was removed and a dressing was applied. The patient tolerated the procedure well without immediate post procedural complication. FINDINGS: A total of approximately 2.0 liters of yellow fluid was removed. Samples were sent to the laboratory as requested by the clinical team. IMPRESSION: Successful ultrasound-guided diagnostic and therapeutic paracentesis yielding 2.0 liters of peritoneal fluid. Read by:  Loyce DysKacie Matthews PA-C Electronically Signed   By: Gilmer MorJaime  Wagner D.O.   On: 06/01/2017 13:51    Scheduled: . midodrine  10 mg Oral TID WC  . octreotide  200 mcg Subcutaneous Q8H  . sodium chloride flush  10-40 mL Intracatheter Q12H     Assessment/Plan: Acute oliguric renal failure due to hepatorenal syndrome- Patient is critically ill. Baseline creatinine of 0.5. Renal function worsening now with hyperkalemia. Patient unable to take kayexalate orally this morning, will try again. Would like to avoid rectal kayexalate. She is already on octreotide and midodrine. Approaching need for CRRT. Very little urine output. [facility]. At this time will try to bring potassium down without dialysis. -changed fluids from NS to bicarb  -will try to give kayexalate orally again -low threshold to start CRRT -[facility] -repeat renal panel at 1pm -monitor renal function closely  Alcoholic Liver cirrhosis w/ SBP- continue antibiotics, octreotide, midodrine. Awaiting transfer.   Anemia- stable. transfusion threshold <7  Dispo[facility]. Patient may need to be transferred to ICU and begin CRRT if worsens.   LOS: 4 days   Tillman Sersngela C Abdulwahab Demelo 06/02/2017,10:51 AM

## 2017-06-02 NOTE — Progress Notes (Signed)
Notified Dr Elisabeth Pigeonevine that K+ is 5.6

## 2017-06-02 NOTE — Progress Notes (Signed)
Pt blood sugar is 59, pt given D50 and Dr Elisabeth Pigeonevine paged about results.

## 2017-06-02 NOTE — Progress Notes (Signed)
Hypoglycemic Event  CBG: 56  Treatment: 25 mL D50  Symptoms: none  Follow-up CBG: Time 0751 CBG Result:87  Possible Reasons for Event: Poor oral intake  Comments/MD notified:     Tina Haley, Tina Haley

## 2017-06-02 NOTE — Progress Notes (Signed)
Pt given Lactulose Enema at this time.

## 2017-06-02 NOTE — Progress Notes (Signed)
Pt lethargic but will open eyes to voice, pt will not answer any questions but will shake head from side to side when mouth care done or trying to get pt to drink kayexalate. Dr Hyman HopesWebb at bedside and ordered bicare gtt since pt will not drink kayexalate.  Will notify Dr Elisabeth Pigeonevine.

## 2017-06-03 ENCOUNTER — Inpatient Hospital Stay (HOSPITAL_COMMUNITY): Payer: 59

## 2017-06-03 DIAGNOSIS — R6510 Systemic inflammatory response syndrome (SIRS) of non-infectious origin without acute organ dysfunction: Secondary | ICD-10-CM

## 2017-06-03 DIAGNOSIS — D689 Coagulation defect, unspecified: Secondary | ICD-10-CM

## 2017-06-03 DIAGNOSIS — E875 Hyperkalemia: Secondary | ICD-10-CM

## 2017-06-03 LAB — COMPREHENSIVE METABOLIC PANEL WITH GFR
ALT: 17 U/L (ref 14–54)
AST: 76 U/L — ABNORMAL HIGH (ref 15–41)
Albumin: 2.6 g/dL — ABNORMAL LOW (ref 3.5–5.0)
Alkaline Phosphatase: 45 U/L (ref 38–126)
Anion gap: 12 (ref 5–15)
BUN: 47 mg/dL — ABNORMAL HIGH (ref 6–20)
CO2: 16 mmol/L — ABNORMAL LOW (ref 22–32)
Calcium: 8.3 mg/dL — ABNORMAL LOW (ref 8.9–10.3)
Chloride: 100 mmol/L — ABNORMAL LOW (ref 101–111)
Creatinine, Ser: 4.15 mg/dL — ABNORMAL HIGH (ref 0.44–1.00)
GFR calc Af Amer: 13 mL/min — ABNORMAL LOW
GFR calc non Af Amer: 11 mL/min — ABNORMAL LOW
Glucose, Bld: 83 mg/dL (ref 65–99)
Potassium: 6 mmol/L — ABNORMAL HIGH (ref 3.5–5.1)
Sodium: 128 mmol/L — ABNORMAL LOW (ref 135–145)
Total Bilirubin: 8.9 mg/dL — ABNORMAL HIGH (ref 0.3–1.2)
Total Protein: 6.2 g/dL — ABNORMAL LOW (ref 6.5–8.1)

## 2017-06-03 LAB — RENAL FUNCTION PANEL
Albumin: 2.5 g/dL — ABNORMAL LOW (ref 3.5–5.0)
Anion gap: 12 (ref 5–15)
BUN: 46 mg/dL — ABNORMAL HIGH (ref 6–20)
CO2: 18 mmol/L — ABNORMAL LOW (ref 22–32)
Calcium: 8.3 mg/dL — ABNORMAL LOW (ref 8.9–10.3)
Chloride: 98 mmol/L — ABNORMAL LOW (ref 101–111)
Creatinine, Ser: 4.22 mg/dL — ABNORMAL HIGH (ref 0.44–1.00)
GFR calc Af Amer: 13 mL/min — ABNORMAL LOW (ref >60)
GFR calc non Af Amer: 11 mL/min — ABNORMAL LOW (ref >60)
Glucose, Bld: 74 mg/dL (ref 65–99)
Phosphorus: 6.6 mg/dL — ABNORMAL HIGH (ref 2.5–4.6)
Potassium: 5.1 mmol/L (ref 3.5–5.1)
Sodium: 128 mmol/L — ABNORMAL LOW (ref 135–145)

## 2017-06-03 LAB — AMMONIA: Ammonia: 98 umol/L — ABNORMAL HIGH (ref 9–35)

## 2017-06-03 LAB — BLOOD GAS, ARTERIAL
Acid-base deficit: 4.1 mmol/L — ABNORMAL HIGH (ref 0.0–2.0)
Bicarbonate: 20.1 mmol/L (ref 20.0–28.0)
Drawn by: 244801
FIO2: 21
O2 Saturation: 94.2 %
Patient temperature: 98.6
pCO2 arterial: 34.6 mmHg (ref 32.0–48.0)
pH, Arterial: 7.382 (ref 7.350–7.450)
pO2, Arterial: 70.8 mmHg — ABNORMAL LOW (ref 83.0–108.0)

## 2017-06-03 LAB — GLUCOSE, CAPILLARY: Glucose-Capillary: 86 mg/dL (ref 65–99)

## 2017-06-03 LAB — PHOSPHORUS: Phosphorus: 6.5 mg/dL — ABNORMAL HIGH (ref 2.5–4.6)

## 2017-06-03 MED ORDER — STERILE WATER FOR INJECTION IV SOLN: INTRAVENOUS | 0 refills | Status: AC

## 2017-06-03 MED ORDER — CHLORHEXIDINE GLUCONATE CLOTH 2 % EX PADS
6.0000 | MEDICATED_PAD | Freq: Every day | CUTANEOUS | Status: DC
  Administered 2017-06-03: 6 via TOPICAL

## 2017-06-03 MED ORDER — DEXTROSE 5 % IV SOLN: 2.0000 g | INTRAVENOUS | Status: AC

## 2017-06-03 MED ORDER — SODIUM CHLORIDE 0.9 % FOR CRRT
INTRAVENOUS_CENTRAL | Status: DC | PRN
  Filled 2017-06-03: qty 1000

## 2017-06-03 MED ORDER — PRISMASOL BGK 4/2.5 32-4-2.5 MEQ/L IV SOLN
INTRAVENOUS | Status: DC
  Filled 2017-06-03 (×3): qty 5000

## 2017-06-03 MED ORDER — SODIUM POLYSTYRENE SULFONATE 15 GM/60ML PO SUSP
60.0000 g | Freq: Once | ORAL | Status: AC
  Administered 2017-06-03: 60 g via RECTAL
  Filled 2017-06-03: qty 240

## 2017-06-03 MED ORDER — PANTOPRAZOLE SODIUM 40 MG IV SOLR: 40.0000 mg | Freq: Two times a day (BID) | INTRAVENOUS | Status: AC

## 2017-06-03 MED ORDER — LACTULOSE ENEMA: 300.0000 mL | Freq: Two times a day (BID) | ORAL | Status: AC

## 2017-06-03 MED ORDER — ALBUMIN HUMAN 25 % IV SOLN: 12.5000 g | Freq: Four times a day (QID) | INTRAVENOUS | Status: AC

## 2017-06-03 MED ORDER — MORPHINE SULFATE (PF) 2 MG/ML IV SOLN
2.0000 mg | Freq: Once | INTRAVENOUS | Status: AC
  Administered 2017-06-03: 2 mg via INTRAVENOUS
  Filled 2017-06-03: qty 1

## 2017-06-03 MED ORDER — PRISMASOL BGK 4/2.5 32-4-2.5 MEQ/L IV SOLN
INTRAVENOUS | Status: DC
  Filled 2017-06-03: qty 5000

## 2017-06-03 MED ORDER — ONDANSETRON HCL 4 MG/2ML IJ SOLN: 4.0000 mg | Freq: Four times a day (QID) | INTRAMUSCULAR | 0 refills | Status: AC | PRN

## 2017-06-03 MED ORDER — SODIUM CHLORIDE 0.9% FLUSH
10.0000 mL | Freq: Two times a day (BID) | INTRAVENOUS | Status: DC
  Administered 2017-06-03: 10 mL

## 2017-06-03 MED ORDER — VITAMIN K1 10 MG/ML IJ SOLN
10.0000 mg | Freq: Once | INTRAMUSCULAR | Status: AC
  Administered 2017-06-03: 10 mg via INTRAVENOUS
  Filled 2017-06-03: qty 1

## 2017-06-03 MED ORDER — LACTULOSE ENEMA
300.0000 mL | Freq: Two times a day (BID) | ORAL | Status: DC
  Administered 2017-06-03: 300 mL via RECTAL
  Filled 2017-06-03: qty 300

## 2017-06-03 MED ORDER — SODIUM CHLORIDE 0.9% FLUSH: 10.0000 mL | INTRAVENOUS | Status: DC | PRN

## 2017-06-03 MED ORDER — HEPARIN SODIUM (PORCINE) 1000 UNIT/ML DIALYSIS
1000.0000 [IU] | INTRAMUSCULAR | Status: DC | PRN
  Filled 2017-06-03: qty 6

## 2017-06-03 MED ORDER — NOREPINEPHRINE BITARTRATE 1 MG/ML IV SOLN: 0.0000 ug/min | INTRAVENOUS | Status: AC

## 2017-06-03 MED ORDER — SODIUM POLYSTYRENE SULFONATE 15 GM/60ML PO SUSP: 15.0000 g | Freq: Once | ORAL | 0 refills | Status: AC

## 2017-06-03 MED ORDER — ALBUMIN HUMAN 25 % IV SOLN
12.5000 g | Freq: Four times a day (QID) | INTRAVENOUS | Status: DC
  Administered 2017-06-03: 12.5 g via INTRAVENOUS
  Filled 2017-06-03 (×2): qty 50

## 2017-06-03 MED ORDER — HEPARIN SODIUM (PORCINE) 1000 UNIT/ML IJ SOLN
2400.0000 [IU] | Freq: Once | INTRAMUSCULAR | Status: AC
  Administered 2017-06-03: 2400 [IU] via INTRAVENOUS
  Filled 2017-06-03: qty 3

## 2017-06-03 MED ORDER — OCTREOTIDE ACETATE 100 MCG/ML IJ SOLN: 200.0000 ug | Freq: Three times a day (TID) | INTRAMUSCULAR | Status: AC

## 2017-06-03 MED ORDER — PANTOPRAZOLE SODIUM 40 MG IV SOLR: 40.0000 mg | Freq: Two times a day (BID) | INTRAVENOUS | Status: DC

## 2017-06-03 MED ORDER — NOREPINEPHRINE BITARTRATE 1 MG/ML IV SOLN
0.0000 ug/min | INTRAVENOUS | Status: DC
  Administered 2017-06-03: 2 ug/min via INTRAVENOUS
  Filled 2017-06-03: qty 4

## 2017-06-03 NOTE — Progress Notes (Signed)
RT Note: ABG results were called to Anders SimmondsPete Babcock, NP and RBV. Rt will continue to assist as needed.

## 2017-06-03 NOTE — Progress Notes (Signed)
Pt will open eyes and moan to voice, pt will not speak or follow commands. Unable to get pt to take any oral medications. Received new orders to transfer to ICU.

## 2017-06-03 NOTE — Progress Notes (Signed)
Patient seen after evaluation and assumption of care by critical care.  Patient has deteriorated significantly and will currently be [facility].  Appreciate PCCM assumption of care.

## 2017-06-03 NOTE — Discharge Summary (Signed)
Physician Discharge Summary       Patient ID: Tina Haley MRN: [id number] DOB/AGE: [birth year] 51 y.o.  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 coli bacteremia Urinary tract infection Spontaneous bacterial peritonitis At risk for hypoglycemia   Detailed Hospital Course:  HISTORY OF PRESENT ILLNESS:   This is a 51 year old female patient with a significant history of alcohol related cirrhosis complicated by diuretic resistant/refractory ascites and bleeding esophageal varices.  Her most recent meld score was 29.  She has been seen in [facility] on [facility].  She presented to the emergency room on 11/6 with chief complaint of abdominal pain, worsening distention, worsening appetite.  Ultimately found to have E. coli bacteremia, felt source to be either urinary tract or SBP.     She had a diagnostic and semi-therapeutic paracentesis completed on 11/7 culture data from this was been negative but remains pending , Gram stain was negative, white blood cells were positive.  Pathology smear demonstrated acute inflammation body fluid cell count demonstrated yellow cloudy appearing fluid white blood cell count was 950 with a neutrophil count of 92%. she is been treated with antibiotics and appropriate supportive care.  Critical care actually saw her in consult on 11/7 at which time she was hemodynamically stable however we noted her creatinine to be climbing and FeNa consistent with prerenal  state.  Unfortunately she has continued to decline over the course of her hospitalization with rising creatinine and findings worrisome for hepatorenal syndrome.  She was [facility].  Critical care asked to see again on 11/11 for: Worsening mental status, rising potassium of 6 for which she was given Kayexalate, and concern about clinical decline.  She was evaluated at bedside, easily arousable, her systolic blood pressure ranged from the low 80s to low 100s.  A follow-up blood chemistry was obtained and this showed her potassium down to 5.1.  An arterial blood gas was obtained this demonstrated a pH of 7.38 PCO2 of 34 a PO2 of 70 and a bicarbonate of 20.  She was transferred to the intensive care where central venous access was placed and Levophed infusion was started.  At that point she was deemed stable for transfer and was [facility].  PAST MEDICAL HISTORY :  She  has a past medical history of Alcohol abuse (06/2015), Anemia (06/2015), Anxiety (06/2015), Ascites, Chronic gastritis, Chronic headaches (06/2015), Cirrhosis (HCC), Complication of anesthesia, Esophageal varices (HCC), Fatty liver (06/2015), GI bleed (06/2015, 10/2015), IDA (iron deficiency anemia), and Splenomegaly.  PAST SURGICAL HISTORY: She  has a past surgical history that includes ORIF tibia & fibula fractures; IR Paracentesis (02/01/2017); IR Paracentesis (02/19/2017); IR Paracentesis (03/09/2017); IR Paracentesis (04/06/2017); IR Paracentesis (05/17/2017); IR Paracentesis (05/30/2017); IR Paracentesis (06/01/2017); ESOPHAGOGASTRODUODENOSCOPY (EGD) possible varice banding (N/A, 04/18/2017); ESOPHAGEAL BANDING (N/A, 04/18/2017); ESOPHAGOGASTRODUODENOSCOPY (EGD) WITH PROPOFOL (N/A, 11/30/2015); and ESOPHAGOGASTRODUODENOSCOPY (EGD) (N/A, 11/04/2015).  No Known Allergies     Discharge Plan by active problems   Hepatorenal syndrome with progressive acute renal failure,  hyponatremia, hyperkalemia, hyperphosphatemia and what appears to be a mix of non-anion gap and anion gap metabolic  acidosis Plan Continue bicarbonate infusion Stat renal profile, received Kayexalate earlier this a.m. If potassium greater than 5 we should go ahead and place dialysis catheter Serial chemistries Checking LA and ABG; if also hypercarbic will need intubation   Hypotension/shock; suspect third spacing and hypovolemia, acidosis may also be contributing factor Plan will need central access, start Levophed for mean arterial pressure greater than 65 Assess central venous pressure, add back scheduled albumin every 6 hours Hold narcotics Continue maintenance IV fluids Holding diuretics  Alcoholic cirrhosis with progressive acute on chronic liver failure with diuretic resistant ascites And history of esophageal varices Plan Continue octreotide Lactulose enemas [facility]  Acute encephalopathy.  Suspect this is a mix of hepatic encephalopathy as well as further, being complicated by metabolic derangements and narcotics. Plan DC narcotics Lactulose enemas Supportive care  Aspiration risk.  Currently protecting airway. Plan N.p.o. 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  Most recent INR 3.14. Plan Administer vitamin K  Anemia of critical illness, and thrombocytopenia without evidence of bleeding Plan Trend CBC Transfuse per protocol  E. coli bacteremia in setting of UTI and SBP Plan Continue cefepime now day #5 of 7 Follow-up culture data  At risk for hypoglycemia Plan Trend cbg   Significant Hospital tests/ studies  Consults< renal, GI, Critical care   STUDIES:  Renal ultrasound 11/9: Negative for hydronephrosis  CULTURES: Urine culture 11/6: E. coli Blood culture x2 11/6: E. coli Blood culture 11/8>>> Ascites fluid 11/7>>  ANTIBIOTICS: Zosyn 11/5-11/7 Vancomycin  11/5-11/6 Cefepime 11/7>>>    Discharge Exam: BP (!) 95/41 (BP Location: Right Arm)   Pulse (!) 106   Temp (!) 97.1 F (36.2 C) (Axillary)   Resp 11   Ht 5\' 3"  (1.6 m)   Wt 157 lb 13.6 oz (71.6 kg)   SpO2 93%   BMI 27.96 kg/m    General: Critically ill-appearing 51 year old female.  Responds only with moaning, does not follow commands Neuro: Opens eyes, moves all extremities HEENT: Temporal wasting, mucous membranes moist, sclera icteric Cardiovascular: Tachycardic, regular rhythm Lungs: Decreased bases no accessory muscle use Abdomen: Distended, shifting dullness, pain to palpation Musculoskeletal: Equal strength and bulk Skin: Diffuse anasarca    Labs at discharge Lab Results  Component Value Date   CREATININE 4.22 (H) 06/03/2017   BUN 46 (H) 06/03/2017   NA 128 (L) 06/03/2017   K 5.1 06/03/2017   CL 98 (L) 06/03/2017   CO2 18 (L) 06/03/2017   Lab Results  Component Value Date   WBC 16.3 (H) 06/02/2017   HGB 8.4 (L) 06/02/2017   HCT 25.3 (L) 06/02/2017   MCV 118.8 (H) 06/02/2017   PLT 125 (L) 06/02/2017   Lab Results  Component Value Date   ALT 17 06/03/2017   AST 76 (H) 06/03/2017   ALKPHOS 45 06/03/2017   BILITOT 8.9 (H) 06/03/2017   Lab Results  Component Value Date   INR 3.14 06/02/2017   INR 2.68 06/01/2017   INR 2.86 05/29/2017    Current radiology studies [facility]  Result Date: 06/01/2017 CLINICAL DATA:  Acute kidney injury.  History of cirrhosis. EXAM: RENAL / URINARY TRACT ULTRASOUND COMPLETE COMPARISON:  CT abdomen and pelvis 05/29/2017 FINDINGS: Examination was mildly limited by patient pain. Right Kidney: Length: 12.6 cm. Echogenicity within normal limits. No mass or hydronephrosis visualized. Left Kidney: Length: 10.0 cm as measured on this ultrasound, however portions of the left kidney were partially obscured likely resulting  in inaccurate measurement. The recent CT demonstrates more symmetric renal size. Echogenicity within normal  limits. No mass or hydronephrosis visualized. Bladder: Decompressed by Foley catheter. Partially visualized complex ascites. IMPRESSION: 1. No hydronephrosis. 2. Ascites. Electronically Signed   By: Sebastian Ache M.D.   On: 06/01/2017 13:01   Ir Paracentesis  Result Date: 06/01/2017 INDICATION: Patient with history of cirrhosis, recurrent ascites. Request is made for diagnostic and therapeutic paracentesis of up to 2.0 L. EXAM: ULTRASOUND GUIDED DIAGNOSTIC AND THERAPEUTIC PARACENTESIS MEDICATIONS: 10 mL 2% lidocaine COMPLICATIONS: None immediate. PROCEDURE: Informed written consent was obtained from the patient after a discussion of the risks, benefits and alternatives to treatment. A timeout was performed prior to the initiation of the procedure. Initial ultrasound scanning demonstrates a large amount of ascites within the right lateral abdomen. The right lateral abdomen was prepped and draped in the usual sterile fashion. 2% lidocaine was used for local anesthesia. Following this, a 19 gauge, 7-cm, Yueh catheter was introduced. An ultrasound image was saved for documentation purposes. The paracentesis was performed. The catheter was removed and a dressing was applied. The patient tolerated the procedure well without immediate post procedural complication. FINDINGS: A total of approximately 2.0 liters of yellow fluid was removed. Samples were sent to the laboratory as requested by the clinical team. IMPRESSION: Successful ultrasound-guided diagnostic and therapeutic paracentesis yielding 2.0 liters of peritoneal fluid. Read by:  Loyce Dys PA-C Electronically Signed   By: Gilmer Mor D.O.   On: 06/01/2017 13:51    Disposition:  01-Home or Self Care    Allergies as of 06/03/2017   No Known Allergies     Medication List    STOP taking these medications   furosemide 40 MG tablet Commonly known as:  LASIX   pantoprazole 40 MG tablet Commonly known as:  PROTONIX Replaced by:  pantoprazole 40  MG injection   spironolactone 100 MG tablet Commonly known as:  ALDACTONE     TAKE these medications   albumin human 25 % bottle Inject 50 mLs (12.5 g total) every 6 (six) hours into the vein.   ceFEPIme 2 g in dextrose 5 % 50 mL Inject 2 g daily into the vein.   lactulose 10 GM/15ML Soln enema Commonly known as:  CHRONULAC Place 300 mLs 2 (two) times daily rectally.   norepinephrine 4 mg in dextrose 5 % 250 mL Inject 0-40 mcg/min continuous into the vein.   octreotide 100 MCG/ML Soln injection Commonly known as:  SANDOSTATIN Inject 2 mLs (200 mcg total) every 8 (eight) hours into the skin.   ondansetron 4 MG/2ML Soln injection Commonly known as:  ZOFRAN Inject 2 mLs (4 mg total) every 6 (six) hours as needed into the vein for nausea.   pantoprazole 40 MG injection Commonly known as:  PROTONIX Inject 40 mg every 12 (twelve) hours into the vein. Replaces:  pantoprazole 40 MG tablet   prenatal multivitamin Tabs tablet Take 1 tablet by mouth daily.   sodium bicarbonate 150 mEq in sterile water 850 mL Continue at 75 ml/hr   sodium polystyrene 15 GM/60ML suspension Commonly known as:  KAYEXALATE Take 60 mLs (15 g total) once for 1 dose by mouth.       Follow-up Information    [name], MD Follow up.   Specialty:  Family Medicine Contact information: [address] [place] [address] [phone]           Discharged Condition: critically ill  Physician Statement:   The Patient  was personally examined, the discharge assessment and plan has been personally reviewed and I agree with ACNP Kearston Putman's assessment and plan. > 30 minutes of time have been dedicated to discharge assessment, planning and discharge instructions.   Signed: Shelby Mattocks 06/03/2017, 11:04 AM

## 2017-06-03 NOTE — Progress Notes (Signed)
Pt has been moaning less since pain medication administration. Pts potassium came back at 6.0 & Ammonia 98. Pt has had 50cc bloody urine throughout the night. [place]ount informed via text. Will continue to monitor.

## 2017-06-03 NOTE — Progress Notes (Signed)
[facility] ROUNDING NOTE   Subjective:   Interval History: This is a 51 year old lady with alcoholic liver disease ad esophageal varices ad banding she has has recurrent ascites and paracentesis  [facility] and has been recommended for a liver transplant She presents with increased in abdominal pain and poor appetite with increase in ascites She has had worsening renal failure and oliguria with a serum creatinine that has increased to above 3 and a strong suspicion for hepatorenal syndrome  Blood pressures have been marginal and the patient has been started on midodrine and IV octreotide as well as maxepime for SBP with Ecoli bacteremia  She is now on Levophed for blood pressure control   [facility]. Renal function continues to decline and discussed proceeding with CRRT today     Objective:  Vital signs in last 24 hours:  Temp:  [96.9 F (36.1 C)-97.4 F (36.3 C)] 97.1 F (36.2 C) (11/11 0734) Pulse Rate:  [104-116] 110 (11/11 1200) Resp:  [10-18] 11 (11/11 1200) BP: (83-105)/(41-57) 88/50 (11/11 1200) SpO2:  [93 %-99 %] 99 % (11/11 1200)  Weight change:  Filed Weights   05/28/17 2155 05/29/17 0939 06/01/17 0600  Weight: 125 lb (56.7 kg) 125 lb (56.7 kg) 157 lb 13.6 oz (71.6 kg)    Intake/Output: I/O last 3 completed shifts: In: 2231.3 [I.V.:2181.3; IV Piggyback:50] Out: 75 [Urine:75]   Intake/Output this shift:  Total I/O In: 1200 [I.V.:1100; IV Piggyback:100] Out: 15 [Urine:15]  General appearance: no distress and ill appearing Resp: clear to auscultation bilaterally Cardio: tachycardic, regular rhythm GI: abnormal findings:  ascites and distended Extremities: edema 3+ Neurologic: Mental status: alertness: lethargic     Basic Metabolic Panel: Recent Labs  Lab 06/01/17 0528 06/02/17 0338 06/02/17 1313 06/03/17 0344 06/03/17 0839  NA 127* 129* 126* 128*  128*  K 5.0 5.6* 5.4* 6.0* 5.1  CL 100* 102 101 100* 98*  CO2 16* 13* 14* 16* 18*  GLUCOSE 78 55* 66 83 74  BUN 40* 41* 46* 47* 46*  CREATININE 3.52* 3.81* 3.85* 4.15* 4.22*  CALCIUM 8.8* 8.9 8.5* 8.3* 8.3*  PHOS  --   --  6.4* 6.5* 6.6*    Liver Function Tests: Recent Labs  Lab 05/29/17 0949 05/30/17 1242 06/01/17 0528 06/02/17 0338 06/02/17 1313 06/03/17 0344 06/03/17 0839  AST 42* 36 44* 50*  --  76*  --   ALT 17 13* 16 13*  --  17  --   ALKPHOS 68 52 48 65  --  45  --   BILITOT 9.2* 8.3* 8.2* 8.7*  --  8.9*  --   PROT 6.8 6.7 6.6 7.3  --  6.2*  --   ALBUMIN 1.9* 2.3* 2.8* 3.1* 2.8* 2.6* 2.5*   Recent Labs  Lab 05/28/17 2205  LIPASE 22   Recent Labs  Lab 06/03/17 0344  AMMONIA 98*    CBC: Recent Labs  Lab 05/28/17 2205 05/29/17 0949 05/30/17 1242 06/01/17 0528 06/02/17 0338  WBC 23.0* 21.1* 18.3* 17.6* 16.3*  NEUTROABS 18.8*  --   --   --   --   HGB 9.6* 8.4* 7.8* 8.7* 8.4*  HCT 29.0* 25.0* 23.1* 26.5* 25.3*  MCV 116.0* 115.2* 115.5* 117.8* 118.8*  PLT 254 181 154 192 125*    Cardiac Enzymes: No results for input(s): CKTOTAL, CKMB, CKMBINDEX, TROPONINI in the last 168 hours.  BNP: Invalid input(s):  POCBNP  CBG: Recent Labs  Lab 06/02/17 1135 06/02/17 1632 06/02/17 1727 06/02/17 2338 06/03/17 0556  GLUCAP 70 59* 125* 94 86    Microbiology: Results for orders placed or performed during the hospital encounter of 05/29/17  Urine culture     Status: Abnormal   Collection Time: 05/29/17  2:54 AM  Result Value Ref Range Status   Specimen Description URINE, RANDOM  Final   Special Requests NONE  Final   Culture 80,000 COLONIES/mL ESCHERICHIA COLI (A)  Final   Report Status 05/31/2017 FINAL  Final   Organism ID, Bacteria ESCHERICHIA COLI (A)  Final      Susceptibility   Escherichia coli - MIC*    AMPICILLIN 8 SENSITIVE Sensitive     CEFAZOLIN <=4 SENSITIVE Sensitive     CEFTRIAXONE <=1 SENSITIVE Sensitive     CIPROFLOXACIN <=0.25  SENSITIVE Sensitive     GENTAMICIN <=1 SENSITIVE Sensitive     IMIPENEM <=0.25 SENSITIVE Sensitive     NITROFURANTOIN <=16 SENSITIVE Sensitive     TRIMETH/SULFA <=20 SENSITIVE Sensitive     AMPICILLIN/SULBACTAM <=2 SENSITIVE Sensitive     PIP/TAZO <=4 SENSITIVE Sensitive     Extended ESBL NEGATIVE Sensitive     * 80,000 COLONIES/mL ESCHERICHIA COLI  Blood culture (routine x 2)     Status: Abnormal   Collection Time: 05/29/17  3:30 AM  Result Value Ref Range Status   Specimen Description BLOOD RIGHT ARM  Final   Special Requests   Final    BOTTLES DRAWN AEROBIC AND ANAEROBIC Blood Culture results may not be optimal due to an excessive volume of blood received in culture bottles   Culture  Setup Time   Final    GRAM NEGATIVE RODS IN BOTH AEROBIC AND ANAEROBIC BOTTLES CRITICAL RESULT CALLED TO, READ BACK BY AND VERIFIED WITH: K PERKINS PHARMD 1947 05/29/17 A BROWNING    Culture (A)  Final    ESCHERICHIA COLI SUSCEPTIBILITIES PERFORMED ON PREVIOUS CULTURE WITHIN THE LAST 5 DAYS.    Report Status 05/31/2017 FINAL  Final  Blood culture (routine x 2)     Status: Abnormal   Collection Time: 05/29/17  3:52 AM  Result Value Ref Range Status   Specimen Description BLOOD LEFT ARM  Final   Special Requests   Final    IN PEDIATRIC BOTTLE Blood Culture results may not be optimal due to an excessive volume of blood received in culture bottles   Culture  Setup Time   Final    GRAM NEGATIVE RODS IN PEDIATRIC BOTTLE CRITICAL RESULT CALLED TO, READ BACK BY AND VERIFIED WITH: K PERKINS PHARMD 1947 05/29/17 A BROWNING    Culture ESCHERICHIA COLI (A)  Final   Report Status 05/31/2017 FINAL  Final   Organism ID, Bacteria ESCHERICHIA COLI  Final      Susceptibility   Escherichia coli - MIC*    AMPICILLIN <=2 SENSITIVE Sensitive     CEFAZOLIN <=4 SENSITIVE Sensitive     CEFEPIME <=1 SENSITIVE Sensitive     CEFTAZIDIME <=1 SENSITIVE Sensitive     CEFTRIAXONE <=1 SENSITIVE Sensitive     CIPROFLOXACIN  <=0.25 SENSITIVE Sensitive     GENTAMICIN <=1 SENSITIVE Sensitive     IMIPENEM <=0.25 SENSITIVE Sensitive     TRIMETH/SULFA <=20 SENSITIVE Sensitive     AMPICILLIN/SULBACTAM <=2 SENSITIVE Sensitive     PIP/TAZO <=4 SENSITIVE Sensitive     Extended ESBL NEGATIVE Sensitive     * ESCHERICHIA COLI  Blood Culture ID Panel (Reflexed)  Status: Abnormal   Collection Time: 05/29/17  3:52 AM  Result Value Ref Range Status   Enterococcus species NOT DETECTED NOT DETECTED Final   Listeria monocytogenes NOT DETECTED NOT DETECTED Final   Staphylococcus species NOT DETECTED NOT DETECTED Final   Staphylococcus aureus NOT DETECTED NOT DETECTED Final   Streptococcus species NOT DETECTED NOT DETECTED Final   Streptococcus agalactiae NOT DETECTED NOT DETECTED Final   Streptococcus pneumoniae NOT DETECTED NOT DETECTED Final   Streptococcus pyogenes NOT DETECTED NOT DETECTED Final   Acinetobacter baumannii NOT DETECTED NOT DETECTED Final   Enterobacteriaceae species DETECTED (A) NOT DETECTED Final    Comment: Enterobacteriaceae represent a large family of gram-negative bacteria, not a single organism. CRITICAL RESULT CALLED TO, READ BACK BY AND VERIFIED WITH: K PERKINS PHARMD 1947 05/29/17 A BROWNING    Enterobacter cloacae complex NOT DETECTED NOT DETECTED Final   Escherichia coli DETECTED (A) NOT DETECTED Final    Comment: CRITICAL RESULT CALLED TO, READ BACK BY AND VERIFIED WITH: K PERKINS PHARMD 1947 05/29/17 A BROWNING    Klebsiella oxytoca NOT DETECTED NOT DETECTED Final   Klebsiella pneumoniae NOT DETECTED NOT DETECTED Final   Proteus species NOT DETECTED NOT DETECTED Final   Serratia marcescens NOT DETECTED NOT DETECTED Final   Carbapenem resistance NOT DETECTED NOT DETECTED Final   Haemophilus influenzae NOT DETECTED NOT DETECTED Final   Neisseria meningitidis NOT DETECTED NOT DETECTED Final   Pseudomonas aeruginosa NOT DETECTED NOT DETECTED Final   Candida albicans NOT DETECTED NOT  DETECTED Final   Candida glabrata NOT DETECTED NOT DETECTED Final   Candida krusei NOT DETECTED NOT DETECTED Final   Candida parapsilosis NOT DETECTED NOT DETECTED Final   Candida tropicalis NOT DETECTED NOT DETECTED Final  MRSA PCR Screening     Status: None   Collection Time: 05/29/17  4:16 PM  Result Value Ref Range Status   MRSA by PCR NEGATIVE NEGATIVE Final    Comment:        The GeneXpert MRSA Assay (FDA approved for NASAL specimens only), is one component of a comprehensive MRSA colonization surveillance program. It is not intended to diagnose MRSA infection nor to guide or monitor treatment for MRSA infections.   Gram stain     Status: None   Collection Time: 05/30/17  1:37 PM  Result Value Ref Range Status   Specimen Description FLUID PERITONEAL  Final   Special Requests NONE  Final   Gram Stain   Final    WBC PRESENT,BOTH PMN AND MONONUCLEAR NO ORGANISMS SEEN CYTOSPIN SMEAR    Report Status 05/30/2017 FINAL  Final  Culture, body fluid-bottle     Status: None (Preliminary result)   Collection Time: 05/30/17  1:37 PM  Result Value Ref Range Status   Specimen Description FLUID PERITONEAL  Final   Special Requests BOTTLES DRAWN AEROBIC AND ANAEROBIC  Final   Culture NO GROWTH 4 DAYS  Final   Report Status PENDING  Incomplete  Culture, blood (routine x 2)     Status: None (Preliminary result)   Collection Time: 05/31/17  6:10 AM  Result Value Ref Range Status   Specimen Description BLOOD RIGHT HAND  Final   Special Requests IN PEDIATRIC BOTTLE Blood Culture adequate volume  Final   Culture NO GROWTH 3 DAYS  Final   Report Status PENDING  Incomplete  Culture, blood (routine x 2)     Status: None (Preliminary result)   Collection Time: 05/31/17  6:15 AM  Result Value Ref  Range Status   Specimen Description BLOOD RIGHT HAND  Final   Special Requests IN PEDIATRIC BOTTLE Blood Culture adequate volume  Final   Culture NO GROWTH 3 DAYS  Final   Report Status PENDING   Incomplete    Coagulation Studies: Recent Labs    06/01/17 0528 06/02/17 0338  LABPROT 28.3* 32.0*  INR 2.68 3.14    Urinalysis: No results for input(s): COLORURINE, LABSPEC, PHURINE, GLUCOSEU, HGBUR, BILIRUBINUR, KETONESUR, PROTEINUR, UROBILINOGEN, NITRITE, LEUKOCYTESUR in the last 72 hours.  Invalid input(s): APPERANCEUR    Imaging: US Renal  Result Date: 06/01/2017 CLINICAL DATA:  Acute kidney injury.  History of cirrhosis. EXAM: RENAL / URINARY TRACT ULTRASOUND COMPLETE COMPARISON:  CT abdomen and pelvis 05/29/2017 FINDINGS: Examination was mildly limited by patient pain. Right Kidney: Length: 12.6 cm. Echogenicity within normal limits. No mass or hydronephrosis visualized. Left Kidney: Length: 10.0 cm as measured on this ultrasound, however portions of the left kidney were partially obscured likely resulting in inaccurate measurement. The recent CT demonstrates more symmetric renal size. Echogenicity within normal limits. No mass or hydronephrosis visualized. Bladder: Decompressed by Foley catheter. Partially visualized complex ascites. IMPRESSION: 1. No hydronephrosis. 2. Ascites. Electronically Signed   By: Sebastian Ache M.D.   On: 06/01/2017 13:01   Dg Chest Port 1 View  Result Date: 06/03/2017 CLINICAL DATA:  Central line placement EXAM: PORTABLE CHEST 1 VIEW COMPARISON:  05/30/2007 . FINDINGS: Right jugular dual-lumen catheter in place tip in the SVC. No pneumothorax Hypoventilation with bibasilar atelectasis. Increased vascular congestion since prior study IMPRESSION: Satisfactory central line placement. Hypoventilation with bibasilar atelectasis. Pulmonary vascular congestion Electronically Signed   By: Marlan Palau M.D.   On: 06/03/2017 12:11   Ir Paracentesis  Result Date: 06/01/2017 INDICATION: Patient with history of cirrhosis, recurrent ascites. Request is made for diagnostic and therapeutic paracentesis of up to 2.0 L. EXAM: ULTRASOUND GUIDED DIAGNOSTIC AND  THERAPEUTIC PARACENTESIS MEDICATIONS: 10 mL 2% lidocaine COMPLICATIONS: None immediate. PROCEDURE: Informed written consent was obtained from the patient after a discussion of the risks, benefits and alternatives to treatment. A timeout was performed prior to the initiation of the procedure. Initial ultrasound scanning demonstrates a large amount of ascites within the right lateral abdomen. The right lateral abdomen was prepped and draped in the usual sterile fashion. 2% lidocaine was used for local anesthesia. Following this, a 19 gauge, 7-cm, Yueh catheter was introduced. An ultrasound image was saved for documentation purposes. The paracentesis was performed. The catheter was removed and a dressing was applied. The patient tolerated the procedure well without immediate post procedural complication. FINDINGS: A total of approximately 2.0 liters of yellow fluid was removed. Samples were sent to the laboratory as requested by the clinical team. IMPRESSION: Successful ultrasound-guided diagnostic and therapeutic paracentesis yielding 2.0 liters of peritoneal fluid. Read by:  Loyce Dys PA-C Electronically Signed   By: Gilmer Mor D.O.   On: 06/01/2017 13:51     Medications:   . albumin human    . ceFEPime (MAXIPIME) IV Stopped (06/02/17 1731)  . norepinephrine (LEVOPHED) Adult infusion 2 mcg/min (06/03/17 1202)  .  sodium bicarbonate (isotonic) infusion in sterile water 100 mL/hr at 06/03/17 1000   . Chlorhexidine Gluconate Cloth  6 each Topical Daily  . lactulose  300 mL Rectal BID  . octreotide  200 mcg Subcutaneous Q8H  . pantoprazole (PROTONIX) IV  40 mg Intravenous Q12H  . sodium chloride flush  10-40 mL Intracatheter Q12H  . sodium chloride flush  10-40  mL Intracatheter Q12H  . sodium polystyrene  15 g Oral Once   lidocaine, lidocaine, [DISCONTINUED] ondansetron **OR** ondansetron (ZOFRAN) IV, sodium chloride flush, sodium chloride flush  Assessment/ Plan:   Acute oliguric renal  failure with a baseline creatinine of 0.5  She was admitted with possible spontaneous bacterial peritonitis and has had many paracentesis draws for her cirrhosis. She has had an increase in creatinine. 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 It appears that the creatinine has continued to increase. Differential would include acute tubular necrosis  Ischemic from shock and low blood pressure and acute interstitial nephritis, We shall send a urinalysis for sodium and for analysis.  Volume   Massive volume resuscitation with total of nearly 10 L administered   Electrolytes stable   Acid Base stable  Dialysis would be indicated as this appears to a bridge to liver transplant - it appears that [facility] and patient is awaiting [facility]. We shall start CRRT 11/11     LOS: 5 Ife Vitelli W @TODAY @12 :36 PM

## 2017-06-03 NOTE — Progress Notes (Signed)
Pt given kayexalate enema at this time. Pt was resting better until we had to move her on her size for enema. She then began to moan again as before. Pts husband at bedside. Will continue to monitor.

## 2017-06-03 NOTE — Progress Notes (Signed)
RT Note: Rt was not notified that there was a STAT ABG ordered. RT was working with other critical patients and did not receive notification that the lab was ordered. As soon as the order was seen, the ABG was obtained. The ABG results will be called to Anders SimmondsPete Babcock, NP. Rt will continue to assist as needed.

## 2017-06-03 NOTE — Progress Notes (Addendum)
[facility] and [facility] RN also informed me about the patient.  I came to assess the patient just so that I had a baseline for the patient and could be second set of eyes for the RN and providers.   Patient was somnelent, occasioning moans, gaurds her abdomen at times, lung sounds were diminished, but air movement bilaterally present.  extremites swollen, abdomen swollen and tight, VS are stable.  [facility].  There has been some discussion of CRRT being an option.   RN did administered lactulose enema and called primary service provider about obtaining labs.   No RRT Interventions.   End Time 2130

## 2017-06-03 NOTE — Progress Notes (Signed)
Progress Note   Subjective  More somnolent overnight Attempts at kayexalate enemas  Unable to participate, moans and opens eyes briefly when stimulated   Objective  Vital signs in last 24 hours: Temp:  [96.9 F (36.1 C)-97.4 F (36.3 C)] 97.1 F (36.2 C) (11/11 0734) Pulse Rate:  [104-116] 106 (11/11 0934) Resp:  [11-19] 11 (11/11 0934) BP: (83-104)/(41-57) 95/41 (11/11 0934) SpO2:  [93 %-96 %] 93 % (11/11 0934) Last BM Date: 06/03/17  General: Somnolent, jaundiced Heart: Tachycardic, regular Chest: Clear to ascultation bilaterally anteriorly Abdomen: Tense, massively distended with ascites, positive bowel sounds  Extremities: 3+ LE edema/anasarca Neurologic: Opens eyes to stimulation, moving all extremities Psych: Sleeping, somnolent  Intake/Output from previous day: 11/10 0701 - 11/11 0700 In: 1556.3 [I.V.:1506.3; IV Piggyback:50] Out: 50 [Urine:50] Intake/Output this shift: Total I/O In: 1200 [I.V.:1100; IV Piggyback:100] Out: -   Lab Results: Recent Labs    06/01/17 0528 06/02/17 0338  WBC 17.6* 16.3*  HGB 8.7* 8.4*  HCT 26.5* 25.3*  PLT 192 125*   BMET Recent Labs    06/02/17 1313 06/03/17 0344 06/03/17 0839  NA 126* 128* 128*  K 5.4* 6.0* 5.1  CL 101 100* 98*  CO2 14* 16* 18*  GLUCOSE 66 83 74  BUN 46* 47* 46*  CREATININE 3.85* 4.15* 4.22*  CALCIUM 8.5* 8.3* 8.3*   LFT Recent Labs    06/03/17 0344 06/03/17 0839  PROT 6.2*  --   ALBUMIN 2.6* 2.5*  AST 76*  --   ALT 17  --   ALKPHOS 45  --   BILITOT 8.9*  --    PT/INR Recent Labs    06/01/17 0528 06/02/17 0338  LABPROT 28.3* 32.0*  INR 2.68 3.14   Hepatitis Panel No results for input(s): HEPBSAG, HCVAB, HEPAIGM, HEPBIGM in the last 72 hours.  Studies/Results: [place] Renal  Result Date: 06/01/2017 CLINICAL DATA:  Acute kidney injury.  History of cirrhosis. EXAM: RENAL / URINARY TRACT ULTRASOUND COMPLETE COMPARISON:  CT abdomen and pelvis 05/29/2017 FINDINGS: Examination was  mildly limited by patient pain. Right Kidney: Length: 12.6 cm. Echogenicity within normal limits. No mass or hydronephrosis visualized. Left Kidney: Length: 10.0 cm as measured on this ultrasound, however portions of the left kidney were partially obscured likely resulting in inaccurate measurement. The recent CT demonstrates more symmetric renal size. Echogenicity within normal limits. No mass or hydronephrosis visualized. Bladder: Decompressed by Foley catheter. Partially visualized complex ascites. IMPRESSION: 1. No hydronephrosis. 2. Ascites. Electronically Signed   By: Tina AcheAllen  Haley M.D.   On: 06/01/2017 13:01   Ir Paracentesis  Result Date: 06/01/2017 INDICATION: Patient with history of cirrhosis, recurrent ascites. Request is made for diagnostic and therapeutic paracentesis of up to 2.0 L. EXAM: ULTRASOUND GUIDED DIAGNOSTIC AND THERAPEUTIC PARACENTESIS MEDICATIONS: 10 mL 2% lidocaine COMPLICATIONS: None immediate. PROCEDURE: Informed written consent was obtained from the patient after a discussion of the risks, benefits and alternatives to treatment. A timeout was performed prior to the initiation of the procedure. Initial ultrasound scanning demonstrates a large amount of ascites within the right lateral abdomen. The right lateral abdomen was prepped and draped in the usual sterile fashion. 2% lidocaine was used for local anesthesia. Following this, a 19 gauge, 7-cm, Yueh catheter was introduced. An ultrasound image was saved for documentation purposes. The paracentesis was performed. The catheter was removed and a dressing was applied. The patient tolerated the procedure well without immediate post procedural complication. FINDINGS: A total of approximately 2.0 liters of  yellow fluid was removed. Samples were sent to the laboratory as requested by the clinical team. IMPRESSION: Successful ultrasound-guided diagnostic and therapeutic paracentesis yielding 2.0 liters of peritoneal fluid. Read by:  Tina DysKacie  Matthews PA-C Electronically Signed   By: Tina MorJaime  Haley D.O.   On: 06/01/2017 13:51      Assessment & Plan  51 year old female with severely decompensated cirrhosis now with hepatorenal syndrome with metabolic acidosis, hyperkalemia, SBP with E. coli bacteremia on cefepime, hepatic encephalopathy  1.  Hepatorenal syndrome --worsened renal function, remains acidotic.  On bicarb drip.  I have consulted critical care medicine who is seen the patient and plan to move patient to ICU and initiate CVVHD.  2.  Decompensated cirrhosis --in the setting of SBP, see #3.  I have spoken to Tina Haley several times a day both to the hospitalist and the ICU attending, Tina Haley.  They are aware of her change in status and imminent transfer to the ICU.  ICU team has spoken directly to [facility] ICU team and plans for transfer.  3.  SBP with E. coli bacteremia --on cefepime, day 5.  To new antibiotics  4.  Hepatic encephalopathy --worsening.  Needs lactulose.  Will need nasoenteric tube, and then aggressive lactulose administration.  Encephalopathy also in the setting of renal failure.  Enemas will be very difficult given inability to retain.  Avoid all sedatives.  5.  Coagulopathy --due to #2, receiving vitamin K.  Coagulopathy worsening in the setting of decompensated liver disease.   Discussed severity of her illness with patient's husband who is at bedside this morning.  He seems to understand the situation and is processing the best he can.  She continues to have an incredibly supportive family with her husband, sister and parents.      LOS: 5 days   Tina Haley  06/03/2017, 10:17 AM

## 2017-06-03 NOTE — Progress Notes (Signed)
Rapid Response called to check up on pt. Pt has been given Lactulose enema. [place] text paged with updates. Informed that pt needs morning labs to be drawn, and that pt has abdominal guarding. Pts husband is at bedside and says she has high tolerance for pain. Minimal bright red blood noted in stool. Will continue to monitor.

## 2017-06-03 NOTE — Procedures (Signed)
Central Venous Catheter Insertion Procedure Note Onnie BoerKathy A Gardella [id number] 1966/04/01  Procedure: Insertion of Central Venous Catheter Indications: Assessment of intravascular volume, Drug and/or fluid administration and Frequent blood sampling  Procedure Details Consent: Risks of procedure as well as the alternatives and risks of each were explained to the (patient/caregiver).  Consent for procedure obtained. Time Out: Verified patient identification, verified procedure, site/side was marked, verified correct patient position, special equipment/implants available, medications/allergies/relevent history reviewed, required imaging and test results available.  Performed Real; time US used to ID and cannulate vessel  Maximum sterile technique was used including antiseptics, cap, gloves, gown, hand hygiene, mask and sheet. Skin prep: Chlorhexidine; local anesthetic administered A antimicrobial bonded/coated triple lumen catheter was placed in the right internal jugular vein using the Seldinger technique.  Evaluation Blood flow good Complications: No apparent complications Patient did tolerate procedure well. Chest X-ray ordered to verify placement.  CXR: pending.  Shelby Mattocksete E Oliver Neuwirth 06/03/2017, 11:36 AM  Simonne MartinetPeter E Varina Hulon ACNP-BC [facility]/Critical Care Pager # [phone] OR # [phone] if no answer

## 2017-06-03 NOTE — Consult Note (Signed)
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  Her most recent meld score was 29.  She has been seen in evaluation on October 10 at [facility] transplant center.  She presented to the emergency room on 11/6 with chief complaint of abdominal pain, worsening distention, worsening appetite.  Ultimately found to have E. coli bacteremia, felt source to be either urinary tract or SBP.  She is been treated with antibiotics and appropriate supportive care.  Critical care actually saw her in consult on 11/7 at which time she was hemodynamically stable however we noted her creatinine to be climbing and FeNa consistent with prerenal state.  Unfortunately she has continued to decline over the course of her hospitalization with rising creatinine and findings worrisome for hepatorenal syndrome.  She was accepted for transfer to [facility] transplant service 11/9.  Critical care asked to see again on 11/11 for: Worsening mental status, rising potassium, and concern about clinical decline.  PAST MEDICAL HISTORY :  She  has a past medical history of Alcohol abuse (06/2015), Anemia (06/2015), Anxiety (06/2015), Ascites, Chronic gastritis, Chronic headaches (06/2015), Cirrhosis (HCC), Complication of anesthesia, Esophageal varices (HCC), Fatty liver (06/2015), GI bleed (06/2015, 10/2015), IDA (iron deficiency anemia), and Splenomegaly.  PAST SURGICAL HISTORY: She  has a past surgical history that includes ORIF tibia & fibula fractures; IR Paracentesis (02/01/2017); IR Paracentesis (02/19/2017); IR Paracentesis (03/09/2017);  IR Paracentesis (04/06/2017); IR Paracentesis (05/17/2017); IR Paracentesis (05/30/2017); IR Paracentesis (06/01/2017); ESOPHAGOGASTRODUODENOSCOPY (EGD) possible varice banding (N/A, 04/18/2017); ESOPHAGEAL BANDING (N/A, 04/18/2017); ESOPHAGOGASTRODUODENOSCOPY (EGD) WITH PROPOFOL (N/A, 11/30/2015); and ESOPHAGOGASTRODUODENOSCOPY (EGD) (N/A, 11/04/2015).  No Known Allergies  No current facility-administered medications on file prior to encounter.    Current Outpatient Medications on File Prior to Encounter  Medication Sig  . furosemide (LASIX) 40 MG tablet Take 40 mg daily as needed by mouth for fluid.   . pantoprazole (PROTONIX) 40 MG tablet Take 1 tablet (40 mg total) by mouth daily.  . Prenatal Vit-Fe Fumarate-FA (PRENATAL MULTIVITAMIN) TABS tablet Take 1 tablet by mouth daily.   Marland Kitchen spironolactone (ALDACTONE) 100 MG tablet Take 1 tablet (100 mg total) by mouth daily.    FAMILY HISTORY:  Her indicated that the status of her mother is unknown. She indicated that the status of her father is unknown. She indicated that the status of her maternal grandfather is unknown. She indicated that the status of her maternal aunt is unknown. She indicated that the status of her neg hx is unknown.   SOCIAL HISTORY: She  reports that  has never smoked. she has never used smokeless tobacco. She reports that she does not drink alcohol or use drugs.  REVIEW OF SYSTEMS:   Unable  SUBJECTIVE:  Critically ill, responsive only to palpation of the abdomen  VITAL SIGNS: BP (!) 104/49   Pulse (!) 113   Temp (!) 97.1 F (36.2 C) (Axillary)   Resp 14   Ht 5\' 3"  (1.6 m)   Wt 157 lb 13.6 oz (71.6 kg)   SpO2 96%   BMI 27.96 kg/m   HEMODYNAMICS:    VENTILATOR SETTINGS:    INTAKE / OUTPUT: I/O last 3  completed shifts: In: 2231.3 [I.V.:2181.3; IV Piggyback:50] Out: 75 [Urine:75]  PHYSICAL EXAMINATION: General: Critically ill-appearing 51 year old female.  Responds only with moaning, does not follow  commands Neuro: Opens eyes, moves all extremities HEENT: Temporal wasting, mucous membranes moist, sclera icteric Cardiovascular: Tachycardic, regular rhythm Lungs: Decreased bases no accessory muscle use Abdomen: Distended, shifting dullness, pain to palpation Musculoskeletal: Equal strength and bulk Skin: Diffuse anasarca  LABS:  BMET Recent Labs  Lab 06/02/17 0338 06/02/17 1313 06/03/17 0344  NA 129* 126* 128*  K 5.6* 5.4* 6.0*  CL 102 101 100*  CO2 13* 14* 16*  BUN 41* 46* 47*  CREATININE 3.81* 3.85* 4.15*  GLUCOSE 55* 66 83    Electrolytes Recent Labs  Lab 06/02/17 0338 06/02/17 1313 06/03/17 0344  CALCIUM 8.9 8.5* 8.3*  PHOS  --  6.4* 6.5*    CBC Recent Labs  Lab 05/30/17 1242 06/01/17 0528 06/02/17 0338  WBC 18.3* 17.6* 16.3*  HGB 7.8* 8.7* 8.4*  HCT 23.1* 26.5* 25.3*  PLT 154 192 125*    Coag's Recent Labs  Lab 05/29/17 0345 06/01/17 0528 06/02/17 0338  APTT 46*  --   --   INR 2.86 2.68 3.14    Sepsis Markers Recent Labs  Lab 05/29/17 0628 05/29/17 0949 05/30/17 1242 05/30/17 1243 05/31/17 0313 06/01/17 0528  LATICACIDVEN 2.4* 1.7  --  1.4  --   --   PROCALCITON  --   --  2.07  --  2.13 2.20    ABG No results for input(s): PHART, PCO2ART, PO2ART in the last 168 hours.  Liver Enzymes Recent Labs  Lab 06/01/17 0528 06/02/17 0338 06/02/17 1313 06/03/17 0344  AST 44* 50*  --  76*  ALT 16 13*  --  17  ALKPHOS 48 65  --  45  BILITOT 8.2* 8.7*  --  8.9*  ALBUMIN 2.8* 3.1* 2.8* 2.6*    Cardiac Enzymes No results for input(s): TROPONINI, PROBNP in the last 168 hours.  Glucose Recent Labs  Lab 06/02/17 0751 06/02/17 1135 06/02/17 1632 06/02/17 1727 06/02/17 2338 06/03/17 0556  GLUCAP 87 70 59* 125* 94 86    Imaging No results found.   STUDIES:  Renal ultrasound 11/9: Negative for hydronephrosis  CULTURES: Urine culture 11/6: E. coli Blood culture x2 11/6: E. coli Blood culture 11/8>>> Ascites fluid  11/7>>  ANTIBIOTICS: Zosyn 11/5-11/7 Vancomycin 11/5-11/6 Cefepime 11/7>>>  SIGNIFICANT EVENTS:   LINES/TUBES:   DISCUSSION: 51 year old with EtOH related cirrhosis now with acute liver failure, hepatorenal syndrome, worsening renal failure, metabolic acidosis, hyperkalemia, and hepatic encephalopathy.  [facility]  Unfortunately slowly declining in spite of supportive care.  At this point urgent attention to hyperkalemia and hypotension are her most pressing matters.  A stat renal profile has been sent, given her altered sensorium she is unable to take her midodrine and unable to take any oral medications.  She will need central access to administer norepinephrine, we will resume scheduled albumin, and may need to initiate CRRT if potassium remains elevated.  I do not think it would be safe to transport her if her potassium is critically elevated without correcting this first.  ASSESSMENT / PLAN:  Hepatorenal syndrome with progressive acute renal failure, hyponatremia, hyperkalemia, hyperphosphatemia and what appears to be a mix of non-anion gap and anion gap metabolic acidosis Plan Continue bicarbonate infusion Stat renal profile, received Kayexalate earlier this a.m. If potassium greater than 5 we should go ahead and place dialysis catheter Serial chemistries Checking  LA and ABG; if also hypercarbic will need intubation   Hypotension/shock; suspect third spacing and hypovolemia, acidosis may also be contributing factor Plan will need central access, start Levophed for mean arterial pressure greater than 65 Assess central venous pressure, add back scheduled albumin every 6 hours Hold narcotics Continue maintenance IV fluids Holding diuretics  Alcoholic cirrhosis with progressive acute on chronic liver failure with diuretic resistant ascites And history of esophageal varices Plan Continue octreotide Lactulose enemas [facility]  Acute  encephalopathy.  Suspect this is a mix of hepatic encephalopathy as well as further, being complicated by metabolic derangements and narcotics. Plan DC narcotics Lactulose enemas Supportive care  Aspiration risk.  Currently protecting airway. Plan N.p.o. 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  Most recent INR 3.14. Plan Administer vitamin K  Anemia of critical illness, and thrombocytopenia without evidence of bleeding Plan Trend CBC Transfuse per protocol  E. coli bacteremia in setting of UTI and SBP Plan Continue cefepime now day #5 of 7 Follow-up culture data  At risk for hypoglycemia Plan Trend cbg   FAMILY  - Updates:   - Inter-disciplinary family meet or Palliative Care meeting due by: 11/18  My critical care time 60 minutes  Simonne MartinetPeter E Rieley Hausman ACNP-BC [facility] Pulmonary/Critical Care Pager # [phone] OR # [phone] if no answer   06/03/2017, 7:58 AM

## 2017-06-03 NOTE — Progress Notes (Signed)
[facility] Will transport pt in bed on tele.

## 2017-06-04 LAB — CULTURE, BODY FLUID W GRAM STAIN -BOTTLE: Culture: NO GROWTH

## 2017-06-04 LAB — PATHOLOGIST SMEAR REVIEW

## 2017-06-04 LAB — CULTURE, BODY FLUID-BOTTLE

## 2017-06-04 MED ORDER — OCTREOTIDE ACETATE 100 MCG/ML IJ SOLN
100.00 | INTRAMUSCULAR | Status: DC
Start: 2017-06-04 — End: 2017-06-04

## 2017-06-04 MED ORDER — LACTULOSE 20 G PO PACK
20.00 | PACK | ORAL | Status: DC
Start: 2017-06-04 — End: 2017-06-04

## 2017-06-04 MED ORDER — MAGNESIUM OXIDE 400 MG PO TABS
400.00 mg | ORAL_TABLET | ORAL | Status: DC
Start: 2017-06-05 — End: 2017-06-04

## 2017-06-04 MED ORDER — GENERIC EXTERNAL MEDICATION
500.00 mg | Status: DC
Start: 2017-06-04 — End: 2017-06-04

## 2017-06-04 MED ORDER — MIDODRINE HCL 5 MG PO TABS
10.00 mg | ORAL_TABLET | ORAL | Status: DC
Start: 2017-06-04 — End: 2017-06-04

## 2017-06-04 MED ORDER — PANTOPRAZOLE SODIUM 40 MG IV SOLR
40.00 mg | INTRAVENOUS | Status: DC
Start: 2017-06-04 — End: 2017-06-04

## 2017-06-04 MED ORDER — GENERIC EXTERNAL MEDICATION
10.00 mg | Status: DC
Start: 2017-06-05 — End: 2017-06-04

## 2017-06-04 MED ORDER — FENTANYL CITRATE (PF) 2500 MCG/50ML IJ SOLN
50.00 | INTRAMUSCULAR | Status: DC
Start: ? — End: 2017-06-04

## 2017-06-04 MED ORDER — GENERIC EXTERNAL MEDICATION
.50 | Status: DC
Start: ? — End: 2017-06-04

## 2017-06-04 MED ORDER — NOREPINEPHRINE BITARTRATE IV
1.00 | INTRAVENOUS | Status: DC
Start: ? — End: 2017-06-04

## 2017-06-05 LAB — CULTURE, BLOOD (ROUTINE X 2)
Culture: NO GROWTH
Culture: NO GROWTH
Special Requests: ADEQUATE
Special Requests: ADEQUATE

## 2017-06-23 DEATH — deceased

## 2017-06-26 ENCOUNTER — Ambulatory Visit (HOSPITAL_COMMUNITY): Admission: RE | Admit: 2017-06-26 | Payer: 59 | Source: Ambulatory Visit | Admitting: Gastroenterology

## 2017-06-26 SURGERY — EGD (ESOPHAGOGASTRODUODENOSCOPY)
Anesthesia: Monitor Anesthesia Care

## 2017-06-26 SURGERY — COLONOSCOPY
Anesthesia: Monitor Anesthesia Care

## 2019-03-06 IMAGING — US IR PARACENTESIS
1 series · 1 of 1 positions shown · non-contrast
Comparison: Previous paracentesis.

MEDICATIONS:
10 cc 1% lidocaine.

COMPLICATIONS:
None immediate.

INDICATION: Recurrent ascites

EXAM:
ULTRASOUND-GUIDED PARACENTESIS
TECHNIQUE: Informed written consent was obtained from the patient after a
discussion of the risks, benefits and alternatives to treatment. A
timeout was performed prior to the initiation of the procedure.

[Series 1: ir (id) (id)/(id)/(id) ir · 1 of 1 slices shown]
[im 1/1]
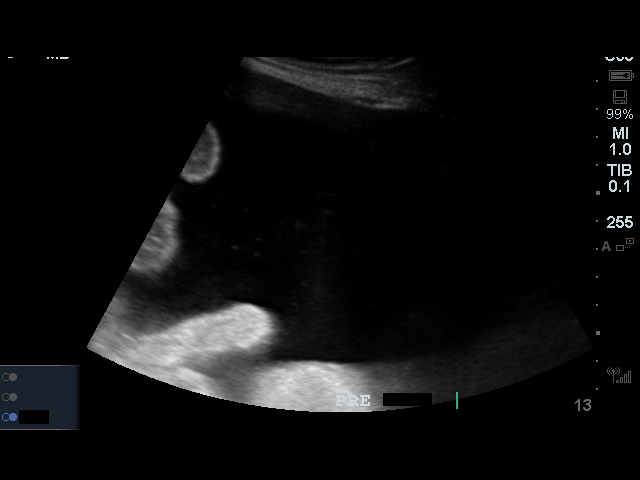

[1 of 1 positions shown; findings below may reference images not displayed]

Initial ultrasound scanning demonstrates a large amount of ascites
within the right lower abdominal quadrant. The right lower abdomen
was prepped and draped in the usual sterile fashion. 1% lidocaine
with epinephrine was used for local anesthesia. Under direct
ultrasound guidance, a 19 gauge, 7-cm, Yueh catheter was introduced.
An ultrasound image was saved for documentation purposed. The
paracentesis was performed. The catheter was removed and a dressing
was applied. The patient tolerated the procedure well without
immediate post procedural complication.
FINDINGS: A total of approximately 2.1 liters of yellow fluid was removed.
Samples were sent to the laboratory as requested by the clinical
team.
IMPRESSION: Successful ultrasound-guided paracentesis yielding 2.1 liters of
peritoneal fluid.

Read by

Benrabah Etoil

## 2019-04-16 IMAGING — US IR PARACENTESIS
2 series · 4 of 4 positions shown · non-contrast
Comparison: none

INDICATION: Patient with history of alcoholic cirrhosis, recurrent ascites.
Request is made for therapeutic paracentesis. Patient to receive
albumin is volume removed is greater than 5 liters.

[Series 1: ir (id) (id)/(id)/(id) ir · 2 of 2 slices shown (1 of 2)]
[im 1/2]
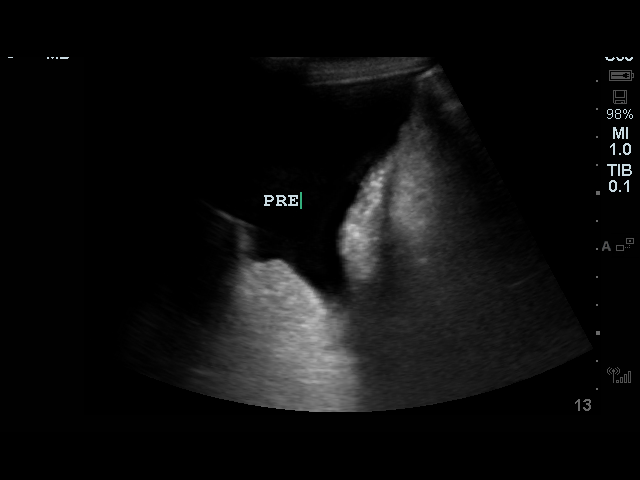
[im 2/2]
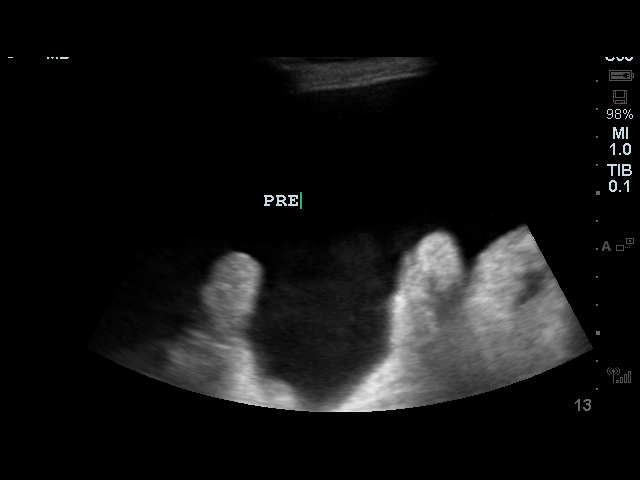

[Series 1: ir (id) (id)/(id)/(id) ir · 2 of 2 slices shown (2 of 2)]
[im 1/2]
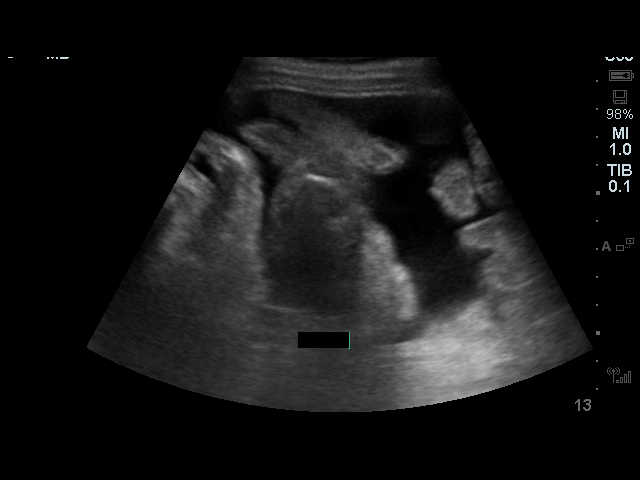
[im 2/2]
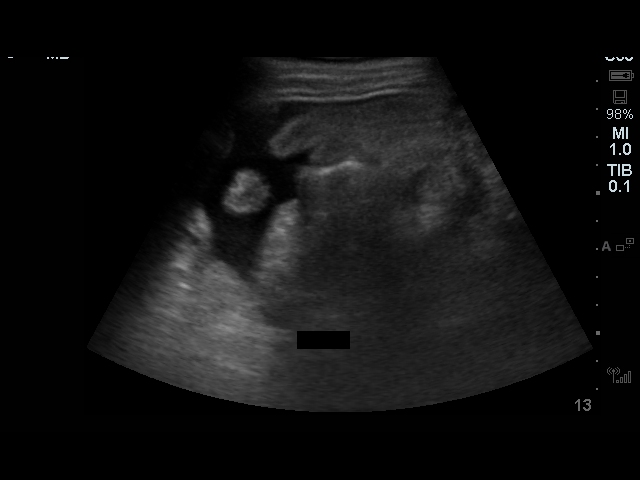

[4 of 4 positions shown; findings below may reference images not displayed]

EXAM:
ULTRASOUND GUIDED DIAGNOSTIC AND THERAPEUTIC PARACENTESIS

MEDICATIONS:
10 mL 1% lidocaine

COMPLICATIONS:
None immediate.

PROCEDURE:
Informed written consent was obtained from the patient after a
discussion of the risks, benefits and alternatives to treatment. A
timeout was performed prior to the initiation of the procedure.

Initial ultrasound scanning demonstrates a large amount of ascites
within the right lateral abdomen. The right lateral abdomen was
prepped and draped in the usual sterile fashion. 1% lidocaine was
used for local anesthesia.

Following this, a 19 gauge, 7-cm, Yueh catheter was introduced. An
ultrasound image was saved for documentation purposes. The
paracentesis was performed. The catheter was removed and a dressing
was applied. The patient tolerated the procedure well without
immediate post procedural complication.
FINDINGS: A total of approximately 5.0 liters of yellow fluid was removed.
Samples were sent to the laboratory as requested by the clinical
team.
IMPRESSION: Successful ultrasound-guided diagnostic and therapeutic paracentesis
yielding 5.0 liters of peritoneal fluid.
